# Patient Record
Sex: Female | Born: 1955
Health system: Southern US, Community
[De-identification: ages and names within clinical notes are randomized; demographics above are authoritative.]

## PROBLEM LIST (undated history)

## (undated) DIAGNOSIS — J45909 Unspecified asthma, uncomplicated: Secondary | ICD-10-CM

## (undated) DIAGNOSIS — F1020 Alcohol dependence, uncomplicated: Secondary | ICD-10-CM

## (undated) DIAGNOSIS — I10 Essential (primary) hypertension: Secondary | ICD-10-CM

## (undated) DIAGNOSIS — T7840XA Allergy, unspecified, initial encounter: Secondary | ICD-10-CM

## (undated) DIAGNOSIS — K219 Gastro-esophageal reflux disease without esophagitis: Secondary | ICD-10-CM

## (undated) DIAGNOSIS — E039 Hypothyroidism, unspecified: Secondary | ICD-10-CM

## (undated) HISTORY — DX: Essential (primary) hypertension: I10

## (undated) HISTORY — DX: Unspecified asthma, uncomplicated: J45.909

## (undated) HISTORY — DX: Gastro-esophageal reflux disease without esophagitis: K21.9

## (undated) HISTORY — DX: Hypothyroidism, unspecified: E03.9

## (undated) HISTORY — DX: Alcohol dependence, uncomplicated: F10.20

## (undated) HISTORY — DX: Allergy, unspecified, initial encounter: T78.40XA

## (undated) HISTORY — PX: TONSILLECTOMY AND ADENOIDECTOMY: SUR1326

## (undated) HISTORY — PX: TUBAL LIGATION: SHX77

## (undated) HISTORY — PX: APPENDECTOMY: SHX54

---

## 1998-03-02 ENCOUNTER — Encounter: Admission: RE | Admit: 1998-03-02 | Discharge: 1998-03-02 | Payer: Self-pay | Admitting: Family Medicine

## 1999-02-26 ENCOUNTER — Encounter: Admission: RE | Admit: 1999-02-26 | Discharge: 1999-02-26 | Payer: Self-pay | Admitting: Family Medicine

## 2000-10-13 ENCOUNTER — Encounter: Admission: RE | Admit: 2000-10-13 | Discharge: 2000-10-13 | Payer: Self-pay | Admitting: Family Medicine

## 2002-10-29 ENCOUNTER — Ambulatory Visit (HOSPITAL_COMMUNITY): Admission: RE | Admit: 2002-10-29 | Discharge: 2002-10-29 | Payer: Self-pay | Admitting: Family Medicine

## 2002-10-29 ENCOUNTER — Encounter: Admission: RE | Admit: 2002-10-29 | Discharge: 2002-10-29 | Payer: Self-pay | Admitting: Family Medicine

## 2002-11-03 ENCOUNTER — Encounter: Payer: Self-pay | Admitting: Sports Medicine

## 2002-11-03 ENCOUNTER — Encounter: Admission: RE | Admit: 2002-11-03 | Discharge: 2002-11-03 | Payer: Self-pay | Admitting: Sports Medicine

## 2002-11-03 ENCOUNTER — Encounter: Admission: RE | Admit: 2002-11-03 | Discharge: 2002-11-03 | Payer: Self-pay | Admitting: Family Medicine

## 2002-11-04 ENCOUNTER — Encounter: Admission: RE | Admit: 2002-11-04 | Discharge: 2002-11-04 | Payer: Self-pay | Admitting: Family Medicine

## 2002-11-16 ENCOUNTER — Ambulatory Visit (HOSPITAL_COMMUNITY): Admission: RE | Admit: 2002-11-16 | Discharge: 2002-11-16 | Payer: Self-pay | Admitting: Family Medicine

## 2003-10-19 ENCOUNTER — Encounter: Admission: RE | Admit: 2003-10-19 | Discharge: 2003-10-19 | Payer: Self-pay | Admitting: Family Medicine

## 2003-10-19 ENCOUNTER — Encounter (INDEPENDENT_AMBULATORY_CARE_PROVIDER_SITE_OTHER): Payer: Self-pay | Admitting: Family Medicine

## 2003-11-18 ENCOUNTER — Encounter: Admission: RE | Admit: 2003-11-18 | Discharge: 2003-11-18 | Payer: Self-pay | Admitting: Family Medicine

## 2003-12-26 ENCOUNTER — Encounter: Admission: RE | Admit: 2003-12-26 | Discharge: 2003-12-26 | Payer: Self-pay | Admitting: Family Medicine

## 2003-12-28 ENCOUNTER — Ambulatory Visit (HOSPITAL_COMMUNITY): Admission: RE | Admit: 2003-12-28 | Discharge: 2003-12-28 | Payer: Self-pay | Admitting: Family Medicine

## 2004-02-08 ENCOUNTER — Ambulatory Visit: Payer: Self-pay | Admitting: Family Medicine

## 2004-04-04 ENCOUNTER — Ambulatory Visit: Payer: Self-pay | Admitting: Family Medicine

## 2004-04-17 ENCOUNTER — Ambulatory Visit (HOSPITAL_COMMUNITY): Admission: RE | Admit: 2004-04-17 | Discharge: 2004-04-17 | Payer: Self-pay | Admitting: Sports Medicine

## 2004-04-17 ENCOUNTER — Ambulatory Visit: Payer: Self-pay | Admitting: Sports Medicine

## 2004-07-25 ENCOUNTER — Ambulatory Visit: Payer: Self-pay | Admitting: Family Medicine

## 2004-10-12 ENCOUNTER — Ambulatory Visit: Payer: Self-pay | Admitting: Family Medicine

## 2005-05-26 ENCOUNTER — Encounter (INDEPENDENT_AMBULATORY_CARE_PROVIDER_SITE_OTHER): Payer: Self-pay | Admitting: *Deleted

## 2005-05-26 LAB — CONVERTED CEMR LAB

## 2005-06-03 ENCOUNTER — Ambulatory Visit: Payer: Self-pay | Admitting: Family Medicine

## 2005-06-03 ENCOUNTER — Encounter (INDEPENDENT_AMBULATORY_CARE_PROVIDER_SITE_OTHER): Payer: Self-pay | Admitting: Specialist

## 2005-06-28 ENCOUNTER — Ambulatory Visit (HOSPITAL_COMMUNITY): Admission: RE | Admit: 2005-06-28 | Discharge: 2005-06-28 | Payer: Self-pay | Admitting: Sports Medicine

## 2005-08-13 ENCOUNTER — Ambulatory Visit: Payer: Self-pay | Admitting: Family Medicine

## 2006-06-27 ENCOUNTER — Ambulatory Visit: Payer: Self-pay | Admitting: Family Medicine

## 2006-06-27 ENCOUNTER — Encounter: Payer: Self-pay | Admitting: Family Medicine

## 2006-06-27 LAB — CONVERTED CEMR LAB: TSH: 6.151 microintl units/mL — ABNORMAL HIGH (ref 0.350–5.50)

## 2006-07-17 DIAGNOSIS — Z6839 Body mass index (BMI) 39.0-39.9, adult: Secondary | ICD-10-CM | POA: Insufficient documentation

## 2006-07-17 DIAGNOSIS — Z6841 Body Mass Index (BMI) 40.0 and over, adult: Secondary | ICD-10-CM | POA: Insufficient documentation

## 2006-07-17 DIAGNOSIS — E669 Obesity, unspecified: Secondary | ICD-10-CM | POA: Insufficient documentation

## 2006-07-17 DIAGNOSIS — N951 Menopausal and female climacteric states: Secondary | ICD-10-CM | POA: Insufficient documentation

## 2006-07-17 DIAGNOSIS — K219 Gastro-esophageal reflux disease without esophagitis: Secondary | ICD-10-CM | POA: Insufficient documentation

## 2006-07-17 DIAGNOSIS — F1010 Alcohol abuse, uncomplicated: Secondary | ICD-10-CM | POA: Insufficient documentation

## 2006-07-17 DIAGNOSIS — I10 Essential (primary) hypertension: Secondary | ICD-10-CM | POA: Insufficient documentation

## 2006-07-17 DIAGNOSIS — E039 Hypothyroidism, unspecified: Secondary | ICD-10-CM | POA: Insufficient documentation

## 2006-07-18 ENCOUNTER — Encounter (INDEPENDENT_AMBULATORY_CARE_PROVIDER_SITE_OTHER): Payer: Self-pay | Admitting: *Deleted

## 2006-07-28 ENCOUNTER — Telehealth: Payer: Self-pay | Admitting: *Deleted

## 2006-07-30 ENCOUNTER — Telehealth: Payer: Self-pay | Admitting: *Deleted

## 2006-08-27 ENCOUNTER — Ambulatory Visit: Payer: Self-pay | Admitting: Family Medicine

## 2006-08-27 ENCOUNTER — Encounter: Payer: Self-pay | Admitting: Family Medicine

## 2006-08-27 LAB — CONVERTED CEMR LAB: TSH: 2.854 microintl units/mL (ref 0.350–5.50)

## 2006-08-28 ENCOUNTER — Encounter: Payer: Self-pay | Admitting: Family Medicine

## 2006-09-02 ENCOUNTER — Ambulatory Visit: Payer: Self-pay | Admitting: Family Medicine

## 2006-09-02 DIAGNOSIS — E739 Lactose intolerance, unspecified: Secondary | ICD-10-CM | POA: Insufficient documentation

## 2006-09-05 ENCOUNTER — Encounter (INDEPENDENT_AMBULATORY_CARE_PROVIDER_SITE_OTHER): Payer: Self-pay | Admitting: Family Medicine

## 2007-03-18 ENCOUNTER — Ambulatory Visit: Payer: Self-pay | Admitting: Family Medicine

## 2007-03-26 ENCOUNTER — Ambulatory Visit: Payer: Self-pay | Admitting: Family Medicine

## 2007-03-26 ENCOUNTER — Encounter (INDEPENDENT_AMBULATORY_CARE_PROVIDER_SITE_OTHER): Payer: Self-pay | Admitting: Family Medicine

## 2007-03-26 LAB — CONVERTED CEMR LAB
ALT: 39 units/L — ABNORMAL HIGH (ref 0–35)
AST: 29 units/L (ref 0–37)
Albumin: 4.2 g/dL (ref 3.5–5.2)
Alkaline Phosphatase: 81 units/L (ref 39–117)
BUN: 15 mg/dL (ref 6–23)
CO2: 23 meq/L (ref 19–32)
Calcium: 9.6 mg/dL (ref 8.4–10.5)
Chloride: 104 meq/L (ref 96–112)
Cholesterol: 200 mg/dL (ref 0–200)
Creatinine, Ser: 0.83 mg/dL (ref 0.40–1.20)
Glucose, Bld: 100 mg/dL — ABNORMAL HIGH (ref 70–99)
HCT: 48.2 % — ABNORMAL HIGH (ref 36.0–46.0)
HDL: 54 mg/dL (ref >39)
Hemoglobin: 16.2 g/dL — ABNORMAL HIGH (ref 12.0–15.0)
LDL Cholesterol: 127 mg/dL — ABNORMAL HIGH (ref 0–99)
MCHC: 33.6 g/dL (ref 30.0–36.0)
MCV: 96.2 fL (ref 78.0–100.0)
Platelets: 267 10*3/uL (ref 150–400)
Potassium: 3.9 meq/L (ref 3.5–5.3)
RBC: 5.01 M/uL (ref 3.87–5.11)
RDW: 12.6 % (ref 11.5–14.0)
Sodium: 140 meq/L (ref 135–145)
TSH: 2.792 microintl units/mL (ref 0.350–5.50)
Total Bilirubin: 0.8 mg/dL (ref 0.3–1.2)
Total CHOL/HDL Ratio: 3.7
Total Protein: 6.9 g/dL (ref 6.0–8.3)
Triglycerides: 95 mg/dL (ref <150)
VLDL: 19 mg/dL (ref 0–40)
WBC: 8.8 10*3/uL (ref 4.0–10.5)

## 2007-03-29 ENCOUNTER — Encounter (INDEPENDENT_AMBULATORY_CARE_PROVIDER_SITE_OTHER): Payer: Self-pay | Admitting: Family Medicine

## 2007-06-01 ENCOUNTER — Telehealth: Payer: Self-pay | Admitting: *Deleted

## 2007-08-04 ENCOUNTER — Telehealth: Payer: Self-pay | Admitting: *Deleted

## 2007-08-17 ENCOUNTER — Ambulatory Visit: Payer: Self-pay | Admitting: Sports Medicine

## 2007-08-17 LAB — CONVERTED CEMR LAB: Rapid Strep: NEGATIVE

## 2007-08-18 ENCOUNTER — Telehealth (INDEPENDENT_AMBULATORY_CARE_PROVIDER_SITE_OTHER): Payer: Self-pay | Admitting: *Deleted

## 2007-08-21 ENCOUNTER — Telehealth (INDEPENDENT_AMBULATORY_CARE_PROVIDER_SITE_OTHER): Payer: Self-pay | Admitting: *Deleted

## 2007-09-07 ENCOUNTER — Ambulatory Visit: Payer: Self-pay | Admitting: Sports Medicine

## 2007-09-07 ENCOUNTER — Encounter (INDEPENDENT_AMBULATORY_CARE_PROVIDER_SITE_OTHER): Payer: Self-pay | Admitting: Family Medicine

## 2007-09-09 LAB — CONVERTED CEMR LAB

## 2007-09-10 ENCOUNTER — Encounter (INDEPENDENT_AMBULATORY_CARE_PROVIDER_SITE_OTHER): Payer: Self-pay | Admitting: Family Medicine

## 2007-10-30 ENCOUNTER — Ambulatory Visit (HOSPITAL_COMMUNITY): Admission: RE | Admit: 2007-10-30 | Discharge: 2007-10-30 | Payer: Self-pay | Admitting: Family Medicine

## 2008-01-11 ENCOUNTER — Ambulatory Visit: Payer: Self-pay | Admitting: Sports Medicine

## 2008-01-15 ENCOUNTER — Encounter: Admission: RE | Admit: 2008-01-15 | Discharge: 2008-01-15 | Payer: Self-pay | Admitting: Family Medicine

## 2008-01-18 ENCOUNTER — Telehealth: Payer: Self-pay | Admitting: Family Medicine

## 2008-01-28 ENCOUNTER — Encounter: Payer: Self-pay | Admitting: Family Medicine

## 2008-01-28 ENCOUNTER — Ambulatory Visit: Payer: Self-pay | Admitting: Family Medicine

## 2008-01-28 DIAGNOSIS — J309 Allergic rhinitis, unspecified: Secondary | ICD-10-CM | POA: Insufficient documentation

## 2008-02-01 LAB — CONVERTED CEMR LAB
BUN: 14 mg/dL (ref 6–23)
CO2: 24 meq/L (ref 19–32)
Calcium: 10.2 mg/dL (ref 8.4–10.5)
Chloride: 99 meq/L (ref 96–112)
Creatinine, Ser: 0.74 mg/dL (ref 0.40–1.20)
Glucose, Bld: 117 mg/dL — ABNORMAL HIGH (ref 70–99)
Potassium: 4.2 meq/L (ref 3.5–5.3)
Sodium: 136 meq/L (ref 135–145)
TSH: 2.834 microintl units/mL (ref 0.350–4.50)

## 2008-02-02 ENCOUNTER — Encounter: Admission: RE | Admit: 2008-02-02 | Discharge: 2008-02-02 | Payer: Self-pay | Admitting: Family Medicine

## 2008-02-16 ENCOUNTER — Encounter: Payer: Self-pay | Admitting: Family Medicine

## 2008-02-18 ENCOUNTER — Encounter: Payer: Self-pay | Admitting: Internal Medicine

## 2008-02-23 ENCOUNTER — Telehealth: Payer: Self-pay | Admitting: *Deleted

## 2008-03-02 ENCOUNTER — Ambulatory Visit (HOSPITAL_COMMUNITY): Admission: RE | Admit: 2008-03-02 | Discharge: 2008-03-02 | Payer: Self-pay | Admitting: Cardiology

## 2008-04-28 ENCOUNTER — Encounter: Payer: Self-pay | Admitting: Family Medicine

## 2008-05-20 HISTORY — PX: COLONOSCOPY: SHX174

## 2008-07-14 ENCOUNTER — Encounter: Payer: Self-pay | Admitting: Family Medicine

## 2008-07-14 ENCOUNTER — Ambulatory Visit: Payer: Self-pay | Admitting: Family Medicine

## 2008-07-14 ENCOUNTER — Telehealth (INDEPENDENT_AMBULATORY_CARE_PROVIDER_SITE_OTHER): Payer: Self-pay | Admitting: *Deleted

## 2008-07-14 LAB — CONVERTED CEMR LAB
Cholesterol: 203 mg/dL — ABNORMAL HIGH (ref 0–200)
HDL: 59 mg/dL (ref >39)
LDL Cholesterol: 124 mg/dL — ABNORMAL HIGH (ref 0–99)
TSH: 1.911 microintl units/mL (ref 0.350–4.50)
Total CHOL/HDL Ratio: 3.4
Triglycerides: 102 mg/dL (ref <150)
VLDL: 20 mg/dL (ref 0–40)

## 2008-07-18 ENCOUNTER — Encounter: Payer: Self-pay | Admitting: Family Medicine

## 2008-08-08 ENCOUNTER — Telehealth: Payer: Self-pay | Admitting: Family Medicine

## 2008-08-29 ENCOUNTER — Ambulatory Visit (HOSPITAL_COMMUNITY): Admission: RE | Admit: 2008-08-29 | Discharge: 2008-08-29 | Payer: Self-pay | Admitting: Otolaryngology

## 2008-10-18 ENCOUNTER — Encounter: Payer: Self-pay | Admitting: Family Medicine

## 2008-10-18 ENCOUNTER — Ambulatory Visit: Payer: Self-pay | Admitting: Family Medicine

## 2008-10-19 LAB — CONVERTED CEMR LAB
ALT: 28 units/L (ref 0–35)
AST: 22 units/L (ref 0–37)
Albumin: 4.6 g/dL (ref 3.5–5.2)
Alkaline Phosphatase: 85 units/L (ref 39–117)
BUN: 14 mg/dL (ref 6–23)
CO2: 25 meq/L (ref 19–32)
Calcium: 10.2 mg/dL (ref 8.4–10.5)
Chloride: 98 meq/L (ref 96–112)
Creatinine, Ser: 0.91 mg/dL (ref 0.40–1.20)
Glucose, Bld: 95 mg/dL (ref 70–99)
Lipase: 23 units/L (ref 0–75)
Potassium: 4.2 meq/L (ref 3.5–5.3)
Sodium: 137 meq/L (ref 135–145)
Total Bilirubin: 0.7 mg/dL (ref 0.3–1.2)
Total Protein: 7.3 g/dL (ref 6.0–8.3)

## 2008-10-24 ENCOUNTER — Encounter: Payer: Self-pay | Admitting: Family Medicine

## 2008-11-28 ENCOUNTER — Encounter: Payer: Self-pay | Admitting: Family Medicine

## 2009-02-06 ENCOUNTER — Ambulatory Visit: Payer: Self-pay | Admitting: Family Medicine

## 2009-02-23 ENCOUNTER — Telehealth: Payer: Self-pay | Admitting: Family Medicine

## 2009-03-08 ENCOUNTER — Encounter: Payer: Self-pay | Admitting: Family Medicine

## 2009-03-31 ENCOUNTER — Encounter: Payer: Self-pay | Admitting: Family Medicine

## 2009-04-04 ENCOUNTER — Encounter: Payer: Self-pay | Admitting: Family Medicine

## 2009-05-02 ENCOUNTER — Ambulatory Visit: Payer: Self-pay | Admitting: Family Medicine

## 2009-06-28 ENCOUNTER — Telehealth: Payer: Self-pay | Admitting: Family Medicine

## 2009-07-26 ENCOUNTER — Encounter: Payer: Self-pay | Admitting: Family Medicine

## 2009-08-02 ENCOUNTER — Encounter: Payer: Self-pay | Admitting: Family Medicine

## 2009-08-28 ENCOUNTER — Ambulatory Visit: Payer: Self-pay | Admitting: Family Medicine

## 2009-08-28 ENCOUNTER — Telehealth: Payer: Self-pay | Admitting: Family Medicine

## 2009-08-28 DIAGNOSIS — J45909 Unspecified asthma, uncomplicated: Secondary | ICD-10-CM | POA: Insufficient documentation

## 2009-08-29 ENCOUNTER — Encounter: Payer: Self-pay | Admitting: Family Medicine

## 2009-09-04 ENCOUNTER — Telehealth: Payer: Self-pay | Admitting: Family Medicine

## 2009-09-05 ENCOUNTER — Encounter: Payer: Self-pay | Admitting: Family Medicine

## 2009-10-19 ENCOUNTER — Telehealth: Payer: Self-pay | Admitting: Family Medicine

## 2009-11-06 ENCOUNTER — Telehealth: Payer: Self-pay | Admitting: Family Medicine

## 2009-11-28 ENCOUNTER — Ambulatory Visit: Payer: Self-pay | Admitting: Family Medicine

## 2009-11-28 ENCOUNTER — Encounter: Payer: Self-pay | Admitting: Family Medicine

## 2009-11-28 LAB — CONVERTED CEMR LAB
ALT: 39 units/L — ABNORMAL HIGH (ref 0–35)
AST: 31 units/L (ref 0–37)
Albumin: 4.3 g/dL (ref 3.5–5.2)
Alkaline Phosphatase: 76 units/L (ref 39–117)
BUN: 15 mg/dL (ref 6–23)
CO2: 25 meq/L (ref 19–32)
Calcium: 9.9 mg/dL (ref 8.4–10.5)
Chloride: 101 meq/L (ref 96–112)
Creatinine, Ser: 0.75 mg/dL (ref 0.40–1.20)
Glucose, Bld: 106 mg/dL — ABNORMAL HIGH (ref 70–99)
HCT: 46.4 % — ABNORMAL HIGH (ref 36.0–46.0)
Hemoglobin: 15.4 g/dL — ABNORMAL HIGH (ref 12.0–15.0)
MCHC: 33.2 g/dL (ref 30.0–36.0)
MCV: 95.3 fL (ref 78.0–100.0)
Platelets: 272 10*3/uL (ref 150–400)
Potassium: 4 meq/L (ref 3.5–5.3)
RBC: 4.87 M/uL (ref 3.87–5.11)
RDW: 13 % (ref 11.5–15.5)
Sodium: 139 meq/L (ref 135–145)
TSH: 4.026 microintl units/mL (ref 0.350–4.500)
Total Bilirubin: 0.5 mg/dL (ref 0.3–1.2)
Total Protein: 6.8 g/dL (ref 6.0–8.3)
WBC: 9.1 10*3/uL (ref 4.0–10.5)

## 2010-03-29 ENCOUNTER — Ambulatory Visit: Payer: Self-pay | Admitting: Family Medicine

## 2010-04-08 ENCOUNTER — Encounter: Payer: Self-pay | Admitting: Family Medicine

## 2010-04-26 ENCOUNTER — Ambulatory Visit: Payer: Self-pay | Admitting: Family Medicine

## 2010-04-26 DIAGNOSIS — J209 Acute bronchitis, unspecified: Secondary | ICD-10-CM | POA: Insufficient documentation

## 2010-05-22 ENCOUNTER — Telehealth (INDEPENDENT_AMBULATORY_CARE_PROVIDER_SITE_OTHER): Payer: Self-pay | Admitting: Family Medicine

## 2010-06-11 ENCOUNTER — Ambulatory Visit: Admission: RE | Admit: 2010-06-11 | Discharge: 2010-06-11 | Payer: Self-pay | Source: Home / Self Care

## 2010-06-19 NOTE — Miscellaneous (Signed)
Summary: Orders Update  Clinical Lists Changes  Orders: Added new Test order of [facility]SH-[facility] ([id number]) - Signed 

## 2010-06-19 NOTE — Assessment & Plan Note (Signed)
Summary: cpp/pap/el   Vital Signs:  Patient Profile:   55 Years Old Female Height:     65.5 inches Weight:      203 pounds Temp:     99.0 degrees F Pulse rate:   61 / minute BP sitting:   140 / 98  Pt. in pain?   no  Vitals Entered By: Jone Baseman CMA (September 07, 2007 9:56 AM)                  Chief Complaint:  CPP.  History of Present Illness: S: Patient here for physical 1. physical- patient exercising by walking 1-2 times a week. LMP was 3 years ago.  2. vertigo-resolved 3. GERD- controlled on as needed tums and rolaids 4-HTN-does not check outside office. no CP, SOB, dizziness 5- dx'd with macular degeneration and trying to go on disability    Current Allergies: No known allergies   Past Medical History:    Reviewed history from 07/17/2006 and no changes required:       C5-6 disk disease, elevated LFTs - 790.4, h/o multiple pyelonephritis infections as child, High-risk EtOH use, Previous smoker, Aurea Graff OTC  Past Surgical History:    Reviewed history from 07/17/2006 and no changes required:       Appendectomy - 10/15/2000, Cr 0.6, LFTs OK - 06/06/2005, gc/chlam neg - 06/06/2005, Lipoma removal right axilla as child - 10/15/2000, PFTs-Ratio 94% - 10/12/2004, T & A - 10/15/2000, transverse C/S w/BTL - 10/15/2000, TSH 7.073 at 11/05 - 5.719 1/07 - 06/06/2005   Family History:    Reviewed history from 07/17/2006 and no changes required:       Father lung cancer, CAD (mult MI) first age 52, HTN, high cholesterol.  Mom had emphysema, d/o ALS.  Mom had mastectomy for `calcifications`., Mom -emphysema, Ca deposit in br s/p mastectomy  Social History:    Reviewed history from 07/17/2006 and no changes required:       married with 2 children, 5 grandchildren.  No tobacco.  4-5 beers/night.  Occasional marijuana.  No cocaine.  Works 2 jobs (60 hrs/wk).  Works part-time at [employer] and [employer].  No DUIs     Physical Exam  General:  Well-developed,well-nourished,in no acute distress; alert,appropriate and cooperative throughout examination Head:     normocephalic and atraumatic.   Eyes:     vision grossly intact.   Ears:     R ear normal and L ear normal.   Nose:     External nasal examination shows no deformity or inflammation. Nasal mucosa are pink and moist without lesions or exudates. Mouth:     MMM Neck:     No deformities, masses, or tenderness noted. Breasts:     No mass, nodules, thickening, tenderness, bulging, retraction, inflamation, nipple discharge or skin changes noted.   Lungs:     Normal respiratory effort, chest expands symmetrically. Lungs are clear to auscultation, no crackles or wheezes. Heart:     Normal rate and regular rhythm. S1 and S2 normal without gallop, murmur, click, rub or other extra sounds. Abdomen:     Bowel sounds positive,abdomen soft and non-tender without masses, organomegaly or hernias noted. Genitalia:     Normal introitus for age, no external lesions, no vaginal discharge, mucosa pink and moist, no vaginal or cervical lesions, no vaginal atrophy, no friaility or hemorrhage, normal uterus size and position, no adnexal masses or tenderness Msk:     No deformity or scoliosis noted of thoracic or  lumbar spine.   Extremities:     No clubbing, cyanosis, edema, or deformity noted with normal full range of motion of all joints.      Impression & Recommendations:  Problem # 1:  WELL ADULT EXAM (ICD-V70.0) Assessment: Comment Only Nl physical exam. pap done tody and provided her with mammogram information.  Orders: [facility] - Est  40-64 yrs ([address])   Problem # 2:  HYPERTENSION, BENIGN SYSTEMIC (ICD-401.1) Assessment: Deteriorated BP slightly elevated today. Will try lifestyle modifications the next 3 months (more exercise, decreased salt and alcohol) and asked her to check it outside office . IF still elevated will likely need additional meds. Her updated medication list for  this problem includes:    Hydrochlorothiazide 25 Mg Tabs (Hydrochlorothiazide) .Marland Kitchen... Take 1 tablet by mouth once a day    Toprol Xl 50 Mg Tb24 (Metoprolol succinate) .Marland Kitchen... Take 1 tablet by mouth once a day   Complete Medication List: 1)  Hydrochlorothiazide 25 Mg Tabs (Hydrochlorothiazide) .... Take 1 tablet by mouth once a day 2)  Synthroid 25 Mcg Tabs (Levothyroxine sodium) .... Take 1 tablet by mouth once a day 3)  Toprol Xl 50 Mg Tb24 (Metoprolol succinate) .... Take 1 tablet by mouth once a day 4)  Zantac 75 75 Mg Tabs (Ranitidine hcl) .... Take 1 tablet by mouth twice a day 5)  Adult Aspirin Ec Low Strength 81 Mg Tbec (Aspirin) .... One tab by mouth qday 6)  Flonase 50 Mcg/act Susp (Fluticasone propionate) .... 2 sprays each nostril daily 7)  Antivert 25 Mg Tabs (Meclizine hcl) .Marland Kitchen.. 1 by mouth 2-3x daily as needed  Other Orders: Pap Smear-[facility] ([id number])   Patient Instructions: 1)  i recommend you have your mammogram done 2)  please follow-up in 3 months for blood presure check. Also try to check it a few times outside the office.  watch your salt intake and try to increase exercise by at least one more day a week    Prescriptions: TOPROL XL 50 MG TB24 (METOPROLOL SUCCINATE) Take 1 tablet by mouth once a day  #31 x 5   Entered and Authorized by:   Altamese Cabal MD   Signed by:   Altamese Cabal MD on 09/08/2007   Method used:   Electronically sent to ...       [facility]  [id number]       [address]       [place], [place]  [address]       Ph: [phone] or [phone]       Fax: [phone]   RxID:   [id number] SYNTHROID 25 MCG TABS (LEVOTHYROXINE SODIUM) Take 1 tablet by mouth once a day  #31 x 5   Entered and Authorized by:   Altamese Cabal MD   Signed by:   Altamese Cabal MD on 09/08/2007   Method used:   Electronically sent to ...       [facility]  [id number]       [address]       [place], [place]  [address]       Ph: [phone] or [phone]       Fax: [phone]   RxID:   [id number] HYDROCHLOROTHIAZIDE 25 MG TABS (HYDROCHLOROTHIAZIDE) Take 1 tablet by mouth once a day  #31 x 5   Entered and Authorized by:   Altamese Cabal MD  Signed by:   Altamese Cabal MD on 09/08/2007   Method used:   Electronically sent to ...       [facility]  [id number]       [address]       [place], [place]  [address]       Ph: [phone] or [phone]       Fax: [phone]   RxID:   [id number] FLONASE 50 MCG/ACT  SUSP (FLUTICASONE PROPIONATE) 2 sprays each nostril daily  #1 x 5   Entered and Authorized by:   Altamese Cabal MD   Signed by:   Altamese Cabal MD on 09/07/2007   Method used:   Electronically sent to ...       [facility]  [id number]       [address]       [place], [place]  [address]       Ph: [phone] or [phone]       Fax: [phone]   RxID:   [id number]  ]

## 2010-06-19 NOTE — Assessment & Plan Note (Signed)
Summary: f/u htn, gerd, abd pain/ patient signout   Vital Signs:  Patient profile:   55 year old female Height:      65.5 inches Weight:      204.5 pounds BMI:     33.63 Pulse rate:   70 / minute BP sitting:   128 / 90  (right arm) Cuff size:   large  Vitals Entered By: Arlyss Repress CMA, (October 18, 2008 9:36 AM) CC: f/up HTN. refill meds. epigastric pain after eating x 2 months. Is Patient Diabetic? No Pain Assessment Patient in pain? no        Primary Care Dontai Pember:  Norton Blizzard MD  CC:  f/up HTN. refill meds. epigastric pain after eating x 2 months..  History of Present Illness: 55 yo F here for f/u several issues.  1. Hypertension Meds: lisin/hctz, toprol xl Taking and tolerating? yes Home BPs: none Chest pain: right side after coughing - hurts to take deep breath now but very mild pain Dyspnea:n - only when coughing a lot but this is improved Claudication:n  2. Sore throat/cough - Has seen ENT and had pH probe done.  Was told reflux is well controlled, felt that cough and throat tickle were more d/t allergies.  Was given nasonex, allegra, and singulair samples.  States this is much much better since initiation of these - almost out of singulair and allegra and needs refills.  Is using flonase nasal spray with these and it is helping just as well.  Was told they saw edema on laryngoscopy in past - asked to have them send [place] records.    3. ETOH abuse - still drinking 5-6 beers a day and not interested in quitting.  Discussed relationship to reflux, pancreatitis, cancers, other health concerns.  4. Abdominal pain - endorsed about 2 months of mild epigastric pain when she is hungry and after eating.  Has not had pancreatitis in past nor cholecystectomy.  Taking PPI and H2 blocker.  Lasts at most 10 minutes at a time then resolves.  Not taking NSAIDs except enteric coated aspirin.   5. Rosacea - has had for years.  Was tried on a medicine but insurance would not cover  this.  6. Stress incontinence - mentioned at end of visit occasionally loses urine with her coughs.  Declined flu shots.  Lipids at goal.  Would like to see Dr. Matthias Hughs for colonoscopy discussion (husband has seen him in past).  Mammo reminder was sent to her in mail.  Habits & Providers  Alcohol-Tobacco-Diet     Alcohol drinks/day: >5     Alcohol Counseling: to decrease amount and/or frequency of alcohol intake     Alcohol type: beer     >5/day in last 3 mos: yes  Current Medications (verified): 1)  Lisinopril-Hydrochlorothiazide 20-25 Mg Tabs (Lisinopril-Hydrochlorothiazide) .Marland Kitchen.. 1 Tab By Mouth Daily 2)  Synthroid 25 Mcg Tabs (Levothyroxine Sodium) .... Take 1 Tablet By Mouth Once A Day 3)  Toprol Xl 50 Mg Tb24 (Metoprolol Succinate) .... Take 1 Tablet By Mouth Once A Day.  Please Make Followup Appointment 4)  Adult Aspirin Ec Low Strength 81 Mg  Tbec (Aspirin) .... One Tab By Mouth Qday 5)  Fluticasone Propionate 50 Mcg/act Susp (Fluticasone Propionate) .... 2 Sprays Each Nostril Daily 6)  Nexium 40 Mg Cpdr (Esomeprazole Magnesium) .Marland Kitchen.. 1 Tab By Mouth Daily 7)  Ranitidine Hcl 150 Mg Tabs (Ranitidine Hcl) .Marland Kitchen.. 1 Tab By Mouth Two Times A Day 8)  Allegra 180 Mg  Tabs (Fexofenadine Hcl) .Marland Kitchen.. 1 Tab By Mouth Daily For Allergies 9)  Singulair 10 Mg Tabs (Montelukast Sodium) .Marland Kitchen.. 1 Tab By Mouth Daily For Allergies 10)  Benzaclin With Pump 1-5 % Gel (Clindamycin Phos-Benzoyl Perox) .... Apply To Affected Areas On Face Two Times A Day For Rosacea  Allergies (verified): No Known Drug Allergies  Past History:  Past Medical History: HTN High risk alcohol abuse along with husband GERD Rosacea C5-6 disk disease  Multiple pyelonephritis infections as child Previous smoker Allergic rhinitis  Past Surgical History: Appendectomy Lipoma removal right axilla as child Tonsillectomy/Adenoidectomy Transverse C/S w/BTL  PFTs-Ratio 94% - 10/12/2004  Social History: married with 2 children, 5  grandchildren.  No tobacco.  5-6 beers/night.  Occasional marijuana.  No cocaine.  Works 2 jobs (60 hrs/wk).  Works part-time at [employer] and [employer].  No DUIs  Physical Exam  General:  Gen: Well-developed, well-nourished, NAD, alert and cooperative HEENT: NCAT, Rosacea noted, PERRL, EOMI, no conjunctival inflammation, pharynx nl without erythema or exudates, MMM. External ear exam nl - TMs without bulging or erythema.  No nasal d/c Neck: No LAN, thyromegaly, masses.  No bruits CV: RRR, no MRG Lungs: Nl resp effort. CTAB no wheezes, rales, rhonchi Abd: soft, nt, nd.  No HSM.  BS normoactive.  No masses Ext: dp pulses 2+, no edema Skin: No rashes or lesions on visible skin.  L lateral foot over head of 5th MTP has a plantar wart, appears unchanged.   Impression & Recommendations:  Problem # 1:  HYPERTENSION, BENIGN SYSTEMIC (ICD-401.1) Assessment Unchanged Right at goal.  Will not make any changes today.  Her updated medication list for this problem includes:    Lisinopril-hydrochlorothiazide 20-25 Mg Tabs (Lisinopril-hydrochlorothiazide) .Marland Kitchen... 1 tab by mouth daily    Toprol Xl 50 Mg Tb24 (Metoprolol succinate) .Marland Kitchen... Take 1 tablet by mouth once a day.  please make followup appointment  Orders: [facility]- Est  Level 4 ([address])  Problem # 2:  THROAT PAIN (ICD-784.1) Assessment: Improved Improved with treatment of allergies.  Continue allegra and flonase - can continue singulair and asked her to experiment with this to stop it and see if she feels the same or if she feels she needs this.  Also continue GERD meds and discussed ETOH relationship to gerd and likely will contribute to scratchy throat and chronic cough if she does not change this behavior.  Has had PFTs in past without evidence of obstruction to point to asthma as cause of her cough.  Orders: [facility]- Est  Level 4 (65784)  Problem # 3:  ALCOHOL ABUSE, UNSPECIFIED (ICD-305.00) Assessment: Unchanged does not want to cut down or  quit at this time.  Orders: [facility]- Est  Level 4 ([address]29)  Problem # 4:  EPIGASTRIC PAIN (ICD-789.06) Assessment: New Intermittent pain related to eating more typical of gastritis, ulcer disease.  Is at risk for pancreatitis given her ETOH use.  Will also assess for possibility of cholecystitis. May need u/s to look for gallstones as biliary colic would be more likely than cholecystitis.  Check h. pylori for its possible relationship to ulcers.  Refer to GI for consideration of EGD and screening colonoscopy as pt is > 89 y/o.  Orders: H pylori-[facility] [phone]) Comp Met-[facility] [phone]) Lipase-[facility] [phone]) [facility]- Est  Level 4 ([address]95) Gastroenterology Referral (GI)  Problem # 5:  ROSACEA (ICD-695.3) Assessment: Unchanged Try benzaclin if covered by insurance.  Orders: [facility]- Est  Level 4 ([address])  Problem # 6:  STRESS INCONTINENCE (ICD-788.39)  Assessment: New Discussed Kegel exercises - she will do twice a day over next month.  If no improvement, refer to urology  Problem # 7:  SPECIAL SCREENING FOR MALIGNANT NEOPLASMS COLON (ICD-V76.51)  Orders: Gastroenterology Referral (GI)  Complete Medication List: 1)  Lisinopril-hydrochlorothiazide 20-25 Mg Tabs (Lisinopril-hydrochlorothiazide) .Marland Kitchen.. 1 tab by mouth daily 2)  Synthroid 25 Mcg Tabs (Levothyroxine sodium) .... Take 1 tablet by mouth once a day 3)  Toprol Xl 50 Mg Tb24 (Metoprolol succinate) .... Take 1 tablet by mouth once a day.  please make followup appointment 4)  Adult Aspirin Ec Low Strength 81 Mg Tbec (Aspirin) .... One tab by mouth qday 5)  Fluticasone Propionate 50 Mcg/act Susp (Fluticasone propionate) .... 2 sprays each nostril daily 6)  Nexium 40 Mg Cpdr (Esomeprazole magnesium) .Marland Kitchen.. 1 tab by mouth daily 7)  Ranitidine Hcl 150 Mg Tabs (Ranitidine hcl) .Marland Kitchen.. 1 tab by mouth two times a day 8)  Allegra 180 Mg Tabs (Fexofenadine hcl) .Marland Kitchen.. 1 tab by mouth daily for allergies 9)  Singulair 10 Mg Tabs (Montelukast sodium) .Marland Kitchen.. 1 tab  by mouth daily for allergies 10)  Benzaclin With Pump 1-5 % Gel (Clindamycin phos-benzoyl perox) .... Apply to affected areas on face two times a day for rosacea  Patient Instructions: 1)  Continue taking the nexium and ranitidine as you have been - nexium on empty stomach in the morning. 2)  I will refer you to a GI doctor (Buccini) to discuss your abdominal pain and colonoscopy.  3)  We will contact you with the appointment and the results of today's labwork. 4)  Do the exercises I told you about for your bladder - you have what is called stress incontinence (losing urine with increased abdominal pressure - usually laughing, coughing). 5)  Avoid ibuprofen, aleve, advil products for pain - these can make your stomach hurt worse. 6)  We will try to see if benzaclin is covered by your insurance for your rosacea.  If you feel your skin is too dry, back off on how often you use this. 7)  I have sent refills of your medications to your pharmacy. 8)  You are due for a mammogram soon. 9)  Ask Dr. Jearld Fenton to fax me records at [phone] when you next see him. 10)  Otherwise follow up in 3 months. Prescriptions: BENZACLIN WITH PUMP 1-5 % GEL (CLINDAMYCIN PHOS-BENZOYL PEROX) apply to affected areas on face two times a day for rosacea  #60g x 2   Entered and Authorized by:   Norton Blizzard MD   Signed by:   Norton Blizzard MD on 10/18/2008   Method used:   Electronically to        [id number]* (retail)       [address]       [place], [place]  [address]       Ph: [phone] or [phone]       Fax: [phone]   RxID:   [id number] RANITIDINE HCL 150 MG TABS (RANITIDINE HCL) 1 tab by mouth two times a day  #60 x 3   Entered and Authorized by:   Norton Blizzard MD   Signed by:   Norton Blizzard MD on 10/18/2008   Method used:   Electronically to        [id number]* (retail)       [address]  [place],  [place]  [address]       Ph: [phone] or [phone]       Fax: [phone]   RxID:   [id number] NEXIUM 40 MG CPDR (ESOMEPRAZOLE MAGNESIUM) 1 tab by mouth daily  #30 x 3   Entered and Authorized by:   Norton Blizzard MD   Signed by:   Norton Blizzard MD on 10/18/2008   Method used:   Electronically to        [id number]* (retail)       [address]       [place], [place]  [address]       Ph: [phone] or [phone]       Fax: [phone]   RxID:   [id number] FLUTICASONE PROPIONATE 50 MCG/ACT SUSP (FLUTICASONE PROPIONATE) 2 sprays each nostril daily  #1 x 5   Entered and Authorized by:   Norton Blizzard MD   Signed by:   Norton Blizzard MD on 10/18/2008   Method used:   Electronically to        [id number]* (retail)       [address]       [place], [place]  [address]       Ph: [phone] or [phone]       Fax: [phone]   RxID:   [id number] TOPROL XL 50 MG TB24 (METOPROLOL SUCCINATE) Take 1 tablet by mouth once a day.  Please make followup appointment  #30 x 3   Entered and Authorized by:   Norton Blizzard MD   Signed by:   Norton Blizzard MD on 10/18/2008   Method used:   Electronically to        [id number]* (retail)       [address]       [place], [place]  [address]       Ph: [phone] or [phone]       Fax: [phone]   RxID:   [id number] SYNTHROID 25 MCG TABS (LEVOTHYROXINE SODIUM) Take 1 tablet by mouth once a day  #30 x 3   Entered and Authorized by:   Norton Blizzard MD   Signed by:   Norton Blizzard MD on 10/18/2008   Method used:   Electronically to        [facility]* (retail)       [address]       [place], [place]  [address]       Ph: [phone] or [phone]       Fax: [phone]   RxID:   [id number] LISINOPRIL-HYDROCHLOROTHIAZIDE 20-25 MG TABS  (LISINOPRIL-HYDROCHLOROTHIAZIDE) 1 tab by mouth daily  #30 x 3   Entered and Authorized by:   Norton Blizzard MD   Signed by:   Norton Blizzard MD on 10/18/2008   Method used:   Electronically to        [facility] [address]* (retail)       [address]       [place], [place]  [address]       Ph: [phone] or [phone]       Fax: [phone]  RxID:   [id number] SINGULAIR 10 MG TABS (MONTELUKAST SODIUM) 1 tab by mouth daily for allergies  #30 x 3   Entered and Authorized by:   Norton Blizzard MD   Signed by:   Norton Blizzard MD on 10/18/2008   Method used:   Electronically to        [facility] [address]* (retail)       [address]       [place], [place]  [address]       Ph: [phone] or [phone]       Fax: [phone]   RxID:   [id number] ALLEGRA 180 MG TABS (FEXOFENADINE HCL) 1 tab by mouth daily for allergies  #30 x 3   Entered and Authorized by:   Norton Blizzard MD   Signed by:   Norton Blizzard MD on 10/18/2008   Method used:   Electronically to        [facility]* (retail)       [address]       [place], [place]  [address]       Ph: [phone] or [phone]       Fax: [phone]   RxID:   [id number]   Last HDL:  59 (07/14/2008 9:04:00 PM) HDL Next Due:  1 yr Last LDL:  124 (07/14/2008 9:04:00 PM) LDL Next Due:  1 yr Last PAP:   Specimen Adequacy: Satisfactory for evaluation.   Interpretation/Result:Negative for intraepithelial Lesion or Malignancy.    (09/09/2007 9:41:52 PM) PAP Next Due:  3 yr   Appended Document: f/u htn, gerd, abd pain/ patient signout Please refer to GI for epigastric abdominal pain and to discuss screening colonoscopy.  Husband sees Dr. Matthias Hughs so she prefers to see him if possible.  Thanks!  Appended Document: f/u htn, gerd, abd pain/ patient signout Appt. made for 10/21/08 at 0945 am.  Called pt and left message to call  us back.  Modesta Messing LPN  Appended Document: H. pylori - neg    Lab Visit   Laboratory Results   Blood Tests   Date/Time Received: October 18, 2008 11:24 AM  Date/Time Reported: October 18, 2008 3:59 PM    H. pylori: negative Comments: ...............test performed by......Marland KitchenBonnie A. [place], [place] (ASCP)     Orders Today:

## 2010-06-19 NOTE — Consult Note (Signed)
Summary: [facility]  [facility]   Imported By: Clydell Hakim 12/02/2008 15:26:13  _____________________________________________________________________  External Attachment:    Type:   Image     Comment:   External Document  Appended Document: [facility] Reviewed and entered into prevention form.   Clinical Lists Changes  Observations: Added new observation of COLONNXTDUE: 11/29/2018 (12/02/2008 15:56) Added new observation of LST COLON DT: 11/28/2008 (11/28/2008 15:58) Added new observation of COLONOSCOPY: diverticulosis (11/28/2008 15:58)      Colonoscopy Result Date:  11/28/2008 Colonoscopy Result:  diverticulosis Colonoscopy Next Due:  10 yr

## 2010-06-19 NOTE — Consult Note (Signed)
Summary: [facility]  [facility]   Imported By: Bradly Bienenstock 05/03/2008 15:30:39  _____________________________________________________________________  External Attachment:    Type:   Image     Comment:   External Document

## 2010-06-19 NOTE — Progress Notes (Signed)
Summary: Nexium 40mg  qd prn #30 x 11  Phone Note Refill Request Call back at Home Phone [phone] Message from:  Patient  Refills Requested: Medication #1:  NEXIUM 40 MG CPDR 1 tab by mouth once daily as needed for reflux   Notes: needs prior auth cough has come back and needs this asap   Initial call taken by: De Nurse,  September 04, 2009 2:11 PM    Prescriptions: NEXIUM 40 MG CPDR (ESOMEPRAZOLE MAGNESIUM) 1 tab by mouth once daily as needed for reflux  #30 x 11   Entered and Authorized by:   Marisue Ivan  MD   Signed by:   Marisue Ivan  MD on 09/04/2009   Method used:   Electronically to        [facility]  [address] (retail)       [address]       [place], [place]  [address]       Ph: [phone] or [phone]       Fax: [phone]   RxID:   [id number]

## 2010-06-19 NOTE — Consult Note (Signed)
[facility]   Imported By: De Nurse 03/23/2009 11:49:14  _____________________________________________________________________  External Attachment:    Type:   Image     Comment:   External Document  Appended Document: medication list update Reviewed   Clinical Lists Changes  Medications: Added new medication of QVAR 80 MCG/ACT AERS (BECLOMETHASONE DIPROPIONATE) 2 puffs daily

## 2010-06-19 NOTE — Letter (Signed)
Summary: Pre Visit No Show Letter  [facility]  [address] [place], [place] [address]   Phone: [phone]  Fax: [phone]        February 18, 2008 MRN: [id number]    [facility] [address] [place], [place]  [address]    Dear Ms. Sparlin,   We have been unable to reach you by phone concerning the pre-procedure visit that you missed on__10.01.2009_________________. For this reason,your procedure scheduled on_10.16.2009___________ has been cancelled. Our scheduling staff will gladly assist you with rescheduling your appointments at a more convenient time. Please call our office at [phone] between the hours of 8:00am and 5:00pm, press option #2 to reach an appointment scheduler. Please consider updating your contact numbers at this time so that we can reach you by phone in the future with schedule changes or results.    Thank you,    Olene Craven RN [facility]

## 2010-06-19 NOTE — Progress Notes (Signed)
Summary: refill  Phone Note Refill Request Call back at Home Phone [phone] Message from:  Patient on November 06, 2009 2:19 PM  Refills Requested: Medication #1:  HYDROCHLOROTHIAZIDE 25 MG  TABS Take 1 tab by mouth every morning  Medication #2:  ZYRTEC ALLERGY 10 MG TABS one tablet by mouth daily Initial call taken by: Loralee Pacas CMA,  November 06, 2009 2:19 PM  Follow-up for Phone Call        to PCP Follow-up by: Gladstone Pih,  November 06, 2009 3:04 PM  Additional Follow-up for Phone Call Additional follow up Details #1::        Notified pt she got a 3 months suppy of both meds on June 4th, stated that she did not, advised to check with pharm to see if they were holding the other 2 months worth due to supply or ins issues.  Pt will check wu=ith pharm, will call back if needs assist Additional Follow-up by: Gladstone Pih,  November 06, 2009 4:22 PM

## 2010-06-19 NOTE — Progress Notes (Signed)
Summary: Questions about OTC meds  Phone Note Call from Patient Call back at Home Phone [phone]   Summary of Call: Pt is having sinus drainage and would like to discuss best OTC meds.   Initial call taken by: Rae Roam,  June 01, 2007 2:55 PM  Follow-up for Phone Call        gave her coricidin and mucinex as safe for people with htn. urged to increase fluid. call if no better Follow-up by: Golden Circle RN,  June 01, 2007 2:57 PM

## 2010-06-19 NOTE — Progress Notes (Signed)
Summary: status of rx  Phone Note Call from Patient Call back at Home Phone [phone]   Reason for Call: Refill Medication Summary of Call: pt is checking status of rx for toprol, pharmacy faxed over a week ago Initial call taken by: ERIN LEVAN,  August 04, 2007 3:23 PM  Follow-up for Phone Call        called it in & notified pt Follow-up by: Golden Circle RN,  August 04, 2007 3:37 PM

## 2010-06-19 NOTE — Progress Notes (Signed)
Summary: 3 month supply of med refills  Phone Note Refill Request Call back at Home Phone [phone] Message from:  Patient  Refills Requested: Medication #1:  HYDROCHLOROTHIAZIDE 25 MG  TABS Take 1 tab by mouth every morning  Medication #2:  METOPROLOL SUCCINATE 100 MG XR24H-TAB 1 tablet by mouth daily for high blood pressure  Medication #3:  ZYRTEC ALLERGY 10 MG TABS one tablet by mouth daily Initial call taken by: De Nurse,  October 19, 2009 1:53 PM    Prescriptions: ZYRTEC ALLERGY 10 MG TABS (CETIRIZINE HCL) one tablet by mouth daily  #90 x 0   Entered and Authorized by:   Marisue Ivan  MD   Signed by:   Marisue Ivan  MD on 10/21/2009   Method used:   Electronically to        [facility]  [address] [id number]* (retail)       [address]       [place], [place]  [address]       Ph: [phone] or [phone]       Fax: [phone]   RxID:   [id number] METOPROLOL SUCCINATE 100 MG XR24H-TAB (METOPROLOL SUCCINATE) 1 tablet by mouth daily for high blood pressure  #90 x 0   Entered and Authorized by:   Marisue Ivan  MD   Signed by:   Marisue Ivan  MD on 10/21/2009   Method used:   Electronically to        [facility]  [address] [id number]* (retail)       [address]       [place], [place]  [address]       Ph: [phone] or [phone]       Fax: [phone]   RxID:   [id number] HYDROCHLOROTHIAZIDE 25 MG  TABS (HYDROCHLOROTHIAZIDE) Take 1 tab by mouth every morning  #90 x 0   Entered and Authorized by:   Marisue Ivan  MD   Signed by:   Marisue Ivan  MD on 10/21/2009   Method used:   Electronically to        [facility]  [address] [id number]* (retail)       [address]       [place], [place]  [address]       Ph: [phone] or [phone]       Fax: [phone]   RxID:   [id number]

## 2010-06-19 NOTE — Progress Notes (Signed)
Summary: Triage  Phone Note Call from Patient Call back at Home Phone [phone]   Reason for Call: Talk to Nurse Summary of Call: pt is requesting to speak with RN re: her sinuses, was already seen for this issue and thinks she may have a sinus infection.  Initial call taken by: Knox Royalty,  August 21, 2007 2:05 PM  Follow-up for Phone Call        Spoke with pt she is requesting antibiotic- she states the antivert helped with her dizziness, and the nose spray helps temporarily with her congestion, but her face and head feels very tight with sinus pressure.  States she has been using coricidin decongestant as she has htn.  Pt states she is not running a fever, but has greenish brown nasal drainage.  Paged Dr. Seleta Rhymes - he states he cannot call in an antibiotic as it has been 4 days since pt was seen.  Advised that she try a sinus rinse, keep using nose spray aggressively.  Also advised if she runs a fever or pain gets to bad this weekend, go to urgent care - pt agreeable.   Follow-up by: AMY MARTIN RN,  August 21, 2007 2:24 PM

## 2010-06-19 NOTE — Consult Note (Signed)
Summary: [facility]   Imported By: Haydee Salter 03/17/2008 14:29:20  _____________________________________________________________________  External Attachment:    Type:   Image     Comment:   External Document

## 2010-06-19 NOTE — Letter (Signed)
Summary: Results Follow Up Letter  [facility]  [address]   [place], [place] [address]   Phone: [phone]  Fax: [phone]    09/10/2007 MRN: [id number]  [address] [place], [place]  [address]  Dear Ms. Streeper,   The following are the results of your recent test(s):  Test     Result     Pap Smear    Normal____x___  Not Normal_____       Comments:good news! your next one will be due in 2 years     _________________________________________________________  Please f/u as previously scheduled _________________________________________________________ _________________________________________________________ _________________________________________________________  Sincerely,  Altamese Cabal MD [facility]          Appended Document: Results Follow Up Letter sent

## 2010-06-19 NOTE — Miscellaneous (Signed)
Summary: prior auth  Clinical Lists Changes pa for Qvar to pcp.Golden Circle RN  August 29, 2009 3:58 PM  she gets this drug from her allergist. dent back to pharmacy asking them to contact her allergist for prior auth & refill per pcp.Golden Circle RN  August 30, 2009 9:16 AM

## 2010-06-19 NOTE — Assessment & Plan Note (Signed)
Summary: meds wp   Vital Signs:  Patient Profile:   55 Years Old Female Height:     65.5 inches Weight:      208.9 pounds BMI:     34.36 Temp:     98.1 degrees F oral Pulse rate:   59 / minute BP sitting:   151 / 81  Pt. in pain?   no  Vitals Entered By: Garen Grams LPN (January 11, 2008 8:51 AM)                   PCP:  Norton Blizzard MD  Chief Complaint:  discuss meds and knot on left elbow.  History of Present Illness: Hypertension Meds: hctz 25, toprol xl 50. Taking and tolerating? yes Home BPs: none - repeat BP here was 158/110 Chest pain: no Dyspnea: no  Knot Left Arm - present for greater than a year per patient.  Just distal to left olecranon bursa.  No known trauma.  No similar lesions on body.  Has had one lipoma that was removed from right axilla when she was a kid.    Hypothyroidism - TSH therapeutic last check on Synthroid 25 daily.  GERD - has heartburn and sour brash.  Only takes prilosec as needed.  No postnasal drip.  Has a tickle in throat constantly and feeling that she has to clear throat.  Chest also feels full.  H/o bad sinus problems but not now.  Has some mild epigastric pain from time to time.  Prevention - due for a colonoscopy but wants to get after she comes back from vacation after 9/29.  Was asking for a cardiac cath too but stated this was very invasive - no chest pain, shortness of breath - discussed would consider a stress eval though.    Prior Medication List:  HYDROCHLOROTHIAZIDE 25 MG TABS (HYDROCHLOROTHIAZIDE) Take 1 tablet by mouth once a day SYNTHROID 25 MCG TABS (LEVOTHYROXINE SODIUM) Take 1 tablet by mouth once a day TOPROL XL 50 MG TB24 (METOPROLOL SUCCINATE) Take 1 tablet by mouth once a day ZANTAC 75 75 MG TABS (RANITIDINE HCL) Take 1 tablet by mouth twice a day ADULT ASPIRIN EC LOW STRENGTH 81 MG  TBEC (ASPIRIN) one tab by mouth qday FLONASE 50 MCG/ACT  SUSP (FLUTICASONE PROPIONATE) 2 sprays each nostril daily ANTIVERT 25  MG  TABS (MECLIZINE HCL) 1 by mouth 2-3x daily as needed   Current Allergies (reviewed today): No known allergies     Risk Factors:  Alcohol use:  yes    Counseled to quit/cut down alcohol use:  yes    Physical Exam  General:     Gen: Well-developed, well-nourished, NAD, alert and cooperative HEENT: NCAT, Rosacea noted, PERRL, EOMI, no conjunctival inflammation, pharynx nl without erythema or exudates, MMM. External ear exam nl - TMs without bulging or erythema.  No nasal d/c Neck: No LAN, thyromegaly, masses, bruits CV: RRR, no MRG Lungs: Nl resp effort. CTAB no wheezes, rales, rhonchi Ext: warm, well perfused.  No edema or cyanosis.  Left elbow distal to olecranon bursa skin overlying hypertrophic without defined mass underneath but feels thickened and separate from bone - bedside ultrasound does not show discrete mass. Skin: No rashes or lesions on visible skin    Impression & Recommendations:  Problem # 1:  LOCALIZED SUPERFICIAL SWELLING MASS OR LUMP (ICD-782.2) Assessment: New Ultrasound without evidence of discrete mass or cyst.  No overlying erythema.  ? if due to trauma.  No need for imaging -  reassured patient.  To use ACE bandage for compression.  Problem # 2:  HYPERTENSION, BENIGN SYSTEMIC (ICD-401.1) Assessment: Deteriorated Repeat still very elevated at 158/110.  She denies ever being on ace and not allergic.  Will add lisinopril.  Continue asa 81.  Check labs in 2 weeks.  Will refer to cardiology for a stress test.  Her updated medication list for this problem includes:    Lisinopril-hydrochlorothiazide 10-12.5 Mg Tabs (Lisinopril-hydrochlorothiazide) .Marland Kitchen... 1 tab by mouth daily    Toprol Xl 50 Mg Tb24 (Metoprolol succinate) .Marland Kitchen... Take 1 tablet by mouth once a day  Future Orders: Basic Met-[facility] ([id number]) ... 12/18/2008 TSH-[facility] ([id number]) ... 12/18/2008   Problem # 3:  GASTROESOPHAGEAL REFLUX, NO ESOPHAGITIS (ICD-530.81) Assessment:  Deteriorated Advised she use PPI regularly.  Can add H2 blocker and/or tums as needed.  Lifestyle modifications discussed.  Consider testing for h. pylori if not improving.  Her updated medication list for this problem includes:    Zantac 75 75 Mg Tabs (Ranitidine hcl) .Marland Kitchen... Take 1 tablet by mouth twice a day    Prilosec Otc 20 Mg Tbec (Omeprazole magnesium) .Marland Kitchen... 1 tab by mouth daily   Problem # 4:  HYPOTHYROIDISM, UNSPECIFIED (ICD-244.9) Assessment: Unchanged Check TSH in 2 weeks when she comes back for other labs.  Her updated medication list for this problem includes:    Synthroid 25 Mcg Tabs (Levothyroxine sodium) .Marland Kitchen... Take 1 tablet by mouth once a day   Problem # 5:  SPECIAL SCREENING FOR MALIGNANT NEOPLASMS COLON (ICD-V76.51) Assessment: Comment Only Would like to have screening colonoscopy after she comes back from vacation on September 29. Orders: Gastroenterology Referral (GI)   Complete Medication List: 1)  Lisinopril-hydrochlorothiazide 10-12.5 Mg Tabs (Lisinopril-hydrochlorothiazide) .Marland Kitchen.. 1 tab by mouth daily 2)  Synthroid 25 Mcg Tabs (Levothyroxine sodium) .... Take 1 tablet by mouth once a day 3)  Toprol Xl 50 Mg Tb24 (Metoprolol succinate) .... Take 1 tablet by mouth once a day 4)  Zantac 75 75 Mg Tabs (Ranitidine hcl) .... Take 1 tablet by mouth twice a day 5)  Adult Aspirin Ec Low Strength 81 Mg Tbec (Aspirin) .... One tab by mouth qday 6)  Flonase 50 Mcg/act Susp (Fluticasone propionate) .... 2 sprays each nostril daily 7)  Antivert 25 Mg Tabs (Meclizine hcl) .Marland Kitchen.. 1 by mouth 2-3x daily as needed 8)  Prilosec Otc 20 Mg Tbec (Omeprazole magnesium) .Marland Kitchen.. 1 tab by mouth daily  Other Orders: CXR- 2view (CXR) Cardiology Other (Cardiology Other)   Patient Instructions: 1)  We will start a combination pill of HCTZ/lisinopril in place of your HCTZ (hydrochlorothiazide).  [facility] [address] 2)  Start taking prilosec daily.  You can  continue taking zantac as well if daily prilosec still doesn't help with your tickle in the throat.  Tums can be used as needed for really bad reflux. 3)  Things that make heartburn worse: fatty foods, chocolate, spicy foods, tomato-based foods, caffeine, smoking, mints, alcohol - reduce or eliminate use of these 4)  Eat small meals 5)  Do not wear tight clothing 6)  Do not eat within 2 hours of going to sleep 7)  Propping the head of your bed up may be helpful as well 8)  Get the Chest x-ray at your convenience. 9)  I will put in referrals for a stress test and for your pre-op colonoscopy (after you get back from vacation) 10)  Use a compression bandage (ACE wrap) for your left  elbow and keep it protected - rubbing it against things can make it worse. 11)  Follow up in 2 weeks just for a lab check - call the day before you come in to tell us you're coming in for labs.  Follow up with me in 2 months.   Prescriptions: LISINOPRIL-HYDROCHLOROTHIAZIDE 10-12.5 MG TABS (LISINOPRIL-HYDROCHLOROTHIAZIDE) 1 tab by mouth daily  #30 x 5   Entered and Authorized by:   Norton Blizzard MD   Signed by:   Norton Blizzard MD on 01/11/2008   Method used:   Electronically to        [facility]  [address] [id number]* (retail)       [address]       [place], [place]  [address]       Ph: [phone] or [phone]       Fax: [phone]   RxID:   [id number]  ]  Appended Document: meds wp Please make referrals for patient to GI (screening colonoscopy - see order) and set up a stress cardiolyte (cardiology test - can set up in a cardiology office without specific cardiology referral) - please make both for after September 29th.  Thanks!  Norton Blizzard MD  January 11, 2008 6:11 PM   See orders............................................... Lisa [name] CMA  January 12, 2008 10:02 AM  Clinical Lists Changes  Orders: Added new Test order of [facility]- Est  Level 4 ([address]) - Signed

## 2010-06-19 NOTE — Consult Note (Signed)
Summary: [facility]  [facility]   Imported By: Clydell Hakim 12/02/2008 15:27:09  _____________________________________________________________________  External Attachment:    Type:   Image     Comment:   External Document  Appended Document: [facility] Reviewed and entered into clinic reports   Clinical Lists Changes  Observations: Added new observation of UGI: normal:   (11/28/2008 15:59)       UGI  Procedure date:  11/28/2008  Findings:      normal:

## 2010-06-19 NOTE — Assessment & Plan Note (Signed)
Summary: medication refills wp   Vital Signs:  Patient Profile:   55 Years Old Female Height:     65.5 inches Weight:      207.3 pounds BMI:     34.09 Temp:     97.8 degrees F oral Pulse rate:   71 / minute BP sitting:   134 / 92  (right arm)  Pt. in pain?   no  Vitals Entered By: Modesta Messing LPN (July 14, 2008 8:27 AM)                 Last HDL:  54 (03/26/2007 7:42:00 PM) HDL Next Due:  1 yr Last LDL:  127 (03/26/2007 7:42:00 PM) LDL Next Due:  1 yr Flex Sig Next Due:  Not Indicated Hemoccult Next Due:  Not Indicated    PCP:  Norton Blizzard MD  Chief Complaint:  Medication refills.  Lesion left foot.Marland Kitchen  History of Present Illness: 55 yo F here for f/u several issues.  1. Hypertension Meds: lisin/hctz, toprol xl Taking and tolerating? yes Home BPs: none Chest pain:n Dyspnea:n - only when coughing a lot feels like she can't catch her breath. Claudication:n  2. GERD/throat pain - seeing ENT for this issue.  Have done laryngoscopies per patient report and looks very inflamed around vocal cords.  She is on nexium, advair, and neurontin for this.  I presume advair is to treat asthma which can lead to chronic cough though this has not helped her.  Nexium for her chronic GERD which have felt is the primary reason for her irritated through and chronic cough.  She has not been on H2 blocker.  Did not see GI for second appointment prior to colonoscopy so colonoscopy was canceled.  3. Lesion on L foot - there unknown period of time but more pain the past few weeks.  Is on outside of MTP head.  Has been filing this down.  No other lesions.  Not using any meds on this.  4. ? breast mass - has had a small tender knot at 12 o'clock position of left breast that comes and goes.  Cannot feel it now.  No other breast masses.  Has had mammogram that was normal in past year.  No nipple discharge, retraction, other complaints.  Declined flu shots.  Due for lipids.  Wanted to wait  until throat issue taken care of before colonoscopy but it has been 4 months - urged her to f/u with them regarding this.    Updated Prior Medication List: LISINOPRIL-HYDROCHLOROTHIAZIDE 20/25 (LISINOPRIL-HYDROCHLOROTHIAZIDE) 1 tab by mouth daily SYNTHROID 25 MCG TABS (LEVOTHYROXINE SODIUM) Take 1 tablet by mouth once a day TOPROL XL 50 MG TB24 (METOPROLOL SUCCINATE) Take 1 tablet by mouth once a day.  Please make followup appointment ZANTAC 75 75 MG TABS (RANITIDINE HCL) Take 1 tablet by mouth twice a day ADULT ASPIRIN EC LOW STRENGTH 81 MG  TBEC (ASPIRIN) one tab by mouth qday FLUTICASONE PROPIONATE 50 MCG/ACT SUSP (FLUTICASONE PROPIONATE) 2 sprays each nostril daily NEXIUM 40 MG CPDR (ESOMEPRAZOLE MAGNESIUM) 1 tab by mouth daily Current Allergies (reviewed today): No known allergies       Physical Exam  General:     Gen: Well-developed, well-nourished, NAD, alert and cooperative HEENT: NCAT, Rosacea noted, PERRL, EOMI, no conjunctival inflammation, pharynx nl without erythema or exudates, MMM. External ear exam nl - TMs without bulging or erythema.  No nasal d/c Neck: No LAN, thyromegaly, masses.  No bruits CV: RRR, no MRG Lungs: Nl  resp effort. CTAB no wheezes, rales, rhonchi Skin: No rashes or lesions on visible skin.  L lateral foot over head of 5th MTP has a plantar wart that appears to be filed down. Breasts:     No mass, nodules, thickening, tenderness, bulging, retraction, inflammation, or skin changes noted.      Impression & Recommendations:  Problem # 1:  HYPERTENSION, BENIGN SYSTEMIC (ICD-401.1) Assessment: Improved Repeat 130/86 at goal.  No changes.  After sending med to pharmacy, she called stating lisin/hctz was increased to 20/25 - made change in our records and sent increased dose to pharmacy.  Her updated medication list for this problem includes:    Lisinopril-hydrochlorothiazide 10-12.5 Mg Tabs (Lisinopril-hydrochlorothiazide) .Marland Kitchen... 1 tab by mouth  daily    Toprol Xl 50 Mg Tb24 (Metoprolol succinate) .Marland Kitchen... Take 1 tablet by mouth once a day.  please make followup appointment  Orders: Lipid-[facility] ([address]-60454) [facility]- Est  Level 4 (09811)   Problem # 2:  PLANTAR WART, LEFT (BJY-782.95) Assessment: New Declined freezing today - agreed with this as on foot surface this can be very painful and may limp as a result.  Will try OTC salicylic acid compounds first - other options if not improving after a month include aldara and freezing.  Orders: [facility]- Est  Level 4 (99214)   Problem # 3:  GASTROESOPHAGEAL REFLUX, NO ESOPHAGITIS (ICD-530.81) Assessment: Deteriorated Likely the cause of her throat pain, persistent cough.  One additional test ENT wants to do per patient - she is going there today to find out when she is having this done.  Add H2 blocker to PPI and tums as needed.  Discussed lifestyle mods again.  She is still eating fatty foods and spicy foods which I told her will make this worse.  Her updated medication list for this problem includes:    Zantac 75 75 Mg Tabs (Ranitidine hcl) .Marland Kitchen... Take 1 tablet by mouth twice a day    Nexium 40 Mg Cpdr (Esomeprazole magnesium) .Marland Kitchen... 1 tab by mouth daily    Ranitidine Hcl 150 Mg Tabs (Ranitidine hcl) .Marland Kitchen... 1 tab by mouth two times a day  Orders: [facility]- Est  Level 4 (62130)   Problem # 4:  THROAT PAIN (ICD-784.1) Assessment: Deteriorated Continue with f/u with ENT.  If no improvement with GERD tx, will refer to GI for additional workup.  Orders: [facility]- Est  Level 4 (86578)   Problem # 5:  BREAST MASS, LEFT (ICD-611.72) Assessment: New No mass felt and last mammogram was negative.  Will follow.  If is consistently enlarged, nipple discharge, persistent pain, advised her to call us for workin appointment.  Orders: [facility]- Est  Level 4 ([address]214)   Problem # 6:  HYPOTHYROIDISM, UNSPECIFIED (ICD-244.9) Assessment: Unchanged  Her updated medication list for this problem includes:    Synthroid  25 Mcg Tabs (Levothyroxine sodium) .Marland Kitchen... Take 1 tablet by mouth once a day  Orders: TSH-[facility] ([id number])   Complete Medication List: 1)  Lisinopril-hydrochlorothiazide 10-12.5 Mg Tabs (Lisinopril-hydrochlorothiazide) .Marland Kitchen.. 1 tab by mouth daily 2)  Synthroid 25 Mcg Tabs (Levothyroxine sodium) .... Take 1 tablet by mouth once a day 3)  Toprol Xl 50 Mg Tb24 (Metoprolol succinate) .... Take 1 tablet by mouth once a day.  please make followup appointment 4)  Zantac 75 75 Mg Tabs (Ranitidine hcl) .... Take 1 tablet by mouth twice a day 5)  Adult Aspirin Ec Low Strength 81 Mg Tbec (Aspirin) .... One tab by mouth qday 6)  Fluticasone Propionate  50 Mcg/act Susp (Fluticasone propionate) .... 2 sprays each nostril daily 7)  Nexium 40 Mg Cpdr (Esomeprazole magnesium) .Marland Kitchen.. 1 tab by mouth daily 8)  Ranitidine Hcl 150 Mg Tabs (Ranitidine hcl) .Marland Kitchen.. 1 tab by mouth two times a day   Patient Instructions: 1)  I will send refills of your medicines to your pharmacy. 2)  Use over the counter medicines with 17-20% salicylic acid in them and paint over the wart on your foot every day - you can put a piece of tape over this to keep the medicine there. 3)  Things that make heartburn worse: fatty foods, chocolate, spicy foods, tomato-based foods, caffeine, smoking, mints, alcohol - reduce or eliminate use of these 4)  Eat small meals 5)  Do not wear tight clothing 6)  Do not eat within 2 hours of going to sleep 7)  Propping the head of your bed up may be helpful as well 8)  Lose weight if you are overweight 9)  The medications for your heartburn: nexium & ranitidine - take both and use tums as needed when you get the burning in your throat. 10)  Call me in a month to let me know how you're doing with this - we may send you to a GI doctor for GERD if you fail this treatment and nothing comes up at your ENT.   Prescriptions: TOPROL XL 50 MG TB24 (METOPROLOL SUCCINATE) Take 1 tablet by mouth once a day.  Please  make followup appointment  #30 x 2   Entered and Authorized by:   Norton Blizzard MD   Signed by:   Norton Blizzard MD on 07/14/2008   Method used:   Electronically to        [id number] (retail)       [address]       [place], [place]  [address]       Ph: [phone] or [phone]       Fax: [phone]   RxID:   [id number] SYNTHROID 25 MCG TABS (LEVOTHYROXINE SODIUM) Take 1 tablet by mouth once a day  #30 x 2   Entered and Authorized by:   Norton Blizzard MD   Signed by:   Norton Blizzard MD on 07/14/2008   Method used:   Electronically to        [id number]* (retail)       [address]       [place], [place]  [address]       Ph: [phone] or [phone]       Fax: [phone]   RxID:   [id number] LISINOPRIL-HYDROCHLOROTHIAZIDE 10-12.5 MG TABS (LISINOPRIL-HYDROCHLOROTHIAZIDE) 1 tab by mouth daily  #30 x 2   Entered and Authorized by:   Norton Blizzard MD   Signed by:   Norton Blizzard MD on 07/14/2008   Method used:   Electronically to        [id number] (retail)       [address]       [place], [place]  [address]       Ph: [phone] or [phone]       Fax: [phone]   RxID:   [id number] RANITIDINE HCL 150 MG TABS (RANITIDINE HCL) 1 tab by mouth two times a day  #60 x 2  Entered and Authorized by:   Norton Blizzard MD   Signed by:   Norton Blizzard MD on 07/14/2008   Method used:   Electronically to        [id number] (retail)       [address]       [place], [place]  [address]       Ph: [phone] or [phone]       Fax: [phone]   RxID:   [id number]

## 2010-06-19 NOTE — Assessment & Plan Note (Signed)
Summary: ear pain,dizzy, sore throat   Vital Signs:  Patient Profile:   55 Years Old Female Height:     65.5 inches Weight:      202.5 pounds Temp:     98.5 degrees F Pulse rate:   74 / minute BP sitting:   150 / 102  (left arm)  Pt. in pain?   yes    Location:   sore throat and earache    Intensity:   8    Type:       aching  Vitals Entered By: Theresia Lo RN (August 17, 2007 9:00 AM)              Is Patient Diabetic? No     History of Present Illness: 2 day hx of runny nose, sinus congestion, right ear pain, sore throat, dry cough.  No fevers, cp/sob. Also with 2 day hx of sensation of room spinning when first sitting up or turning head, associated w/ nausea. Sx last fo 3-5  minutes.  Hx of chronic runny nose and sinus pain.  Denies seasonal allergies.    Prior Medications Reviewed Using: Patient Recall  Current Allergies: No known allergies     Risk Factors:  Tobacco use:  quit    Physical Exam  General:     Well-developed,well-nourished,in no acute distress; alert,appropriate and cooperative throughout examination Head:     + clicking over right TMJ joint Ears:     External ear exam shows no significant lesions or deformities.  Otoscopic examination reveals clear canals, tympanic membranes are intact bilaterally without bulging, retraction, inflammation or discharge. Hearing is grossly normal bilaterally. Nose:     clear rhinorrea, mild erythema Mouth:     very mild erythema, small amount of postnasal drip Neck:     + anterior cervical LAD Lungs:     Normal respiratory effort, chest expands symmetrically. Lungs are clear to auscultation, no crackles or wheezes. Heart:     Normal rate and regular rhythm. S1 and S2 normal without gallop, murmur, click, rub or other extra sounds. Neurologic:     alert & oriented X3, cranial nerves II-XII intact, strength normal in all extremities, gait normal, finger-to-nose normal, and Romberg negative.   +  vertigo w/ dix-hallpike maneuver, no nystagmus though    Impression & Recommendations:  Problem # 1:  URI (ICD-465.9) Assessment: New URI. No evidence of pneumonia. Significan nasal congestion, treat with OTC cold meds as well as astelin nasal spray. Sounds like she may have some chonic rhinitis, consider addition of nasal steroid at f/u visits. Her updated medication list for this problem includes:    Adult Aspirin Ec Low Strength 81 Mg Tbec (Aspirin) ..... One tab by mouth qday  Orders: [facility]- Est  Level 4 (04540)   Problem # 2:  VERTIGO (ICD-780.4) Assessment: New Normal neuro exam, sx brought on w/ dix-hallpike maneuver. Will treat w/ as needed antivert. Her updated medication list for this problem includes:    Antivert 25 Mg Tabs (Meclizine hcl) .Marland Kitchen... 1 by mouth 2-3x daily as needed  Orders: [facility]- Est  Level 4 (99214)   Problem # 3:  EAR PAIN (ICD-388.70) No evidence of AOM or effusion.  Possibly due to TMJ dysfunction vs. congestion with URI.  Consider adding NSAID if sx persist once URI resolved. Orders: [facility]- Est  Level 4 (98119)   Complete Medication List: 1)  Hydrochlorothiazide 25 Mg Tabs (Hydrochlorothiazide) .... Take 1 tablet by mouth once a day 2)  Synthroid 25  Mcg Tabs (Levothyroxine sodium) .... Take 1 tablet by mouth once a day 3)  Toprol Xl 50 Mg Tb24 (Metoprolol succinate) .... Take 1 tablet by mouth once a day 4)  Zantac 75 75 Mg Tabs (Ranitidine hcl) .... Take 1 tablet by mouth twice a day 5)  Adult Aspirin Ec Low Strength 81 Mg Tbec (Aspirin) .... One tab by mouth qday 6)  Astelin 137 Mcg/spray Soln (Azelastine hcl) .Marland Kitchen.. 1 spray each nostril twice daily 7)  Antivert 25 Mg Tabs (Meclizine hcl) .Marland Kitchen.. 1 by mouth 2-3x daily as needed  Other Orders: Rapid Strep-[facility] ([address])   Patient Instructions: 1)  Take antivert 2-3x daily as needed for vertigo 2)  Use astelin nasal spray for nasal congestion 3)  follow up with dr. Iven Finn in 1-2 weeks as previously  discussed    Prescriptions: ANTIVERT 25 MG  TABS (MECLIZINE HCL) 1 by mouth 2-3x daily as needed  #30 x 3   Entered and Authorized by:   Benn Moulder MD   Signed by:   Benn Moulder MD on 08/17/2007   Method used:   Print then Give to Patient   RxID:   [id number] ASTELIN 137 MCG/SPRAY SOLN (AZELASTINE HCL) 1 spray each nostril twice daily  #1 x 1   Entered and Authorized by:   Benn Moulder MD   Signed by:   Benn Moulder MD on 08/17/2007   Method used:   Print then Give to Patient   RxID:   [id number]  ]  Vital Signs:  Patient Profile:   55 Years Old Female Height:     65.5 inches Weight:      202.5 pounds Temp:     98.5 degrees F Pulse rate:   74 / minute BP sitting:   150 / 102    Location:   sore throat and earache    Intensity:   8    Type:       aching                Laboratory Results  Date/Time Received: August 17, 2007 9:14 AM  Date/Time Reported: August 17, 2007 10:08 AM   Other Tests  Rapid Strep: negative Comments: ...................................................................DONNA LORING  August 17, 2007 10:08 AM

## 2010-06-19 NOTE — Consult Note (Signed)
Summary: [facility] - to have EGD, colonoscopy  [facility]   Imported By: De Nurse 10/26/2008 14:30:04  _____________________________________________________________________  External Attachment:    Type:   Image     Comment:   External Document

## 2010-06-19 NOTE — Progress Notes (Signed)
Summary: requesting alternate rx  Phone Note Call from Patient Call back at Home Phone [phone]   Reason for Call: Talk to Nurse Summary of Call: pt is requesting an alternate rx for the nasal spray, sts it was $95, pt goes to [facility] Initial call taken by: Knox Royalty,  August 18, 2007 2:10 PM  Follow-up for Phone Call        Will forward to MD Follow-up by: ASHA BENTON LPN,  August 18, 2007 2:25 PM  Additional Follow-up for Phone Call Additional follow up Details #1::        all prescription nasal sprays will be that price. She can try OTC nasal saline spray or Afrin (but can only use Afrin up to 5 days in a row due to risk of rebound symptoms) Additional Follow-up by: Altamese Cabal MD,  August 18, 2007 3:23 PM    Additional Follow-up for Phone Call Additional follow up Details #2::    Pt. contacted and stated she understood.  Stated she is going to hold rx and use it when her Medicaid goes thru Follow-up by: Starleen Blue RN,  August 18, 2007 4:39 PM

## 2010-06-19 NOTE — Letter (Signed)
Summary: [facility]  Pt has appt on 09/26/2006 @ 8:00am at [facility].  Hardcopy in [employer] box. left message 4/18 left message 4/21  Pt called to confirm appt

## 2010-06-19 NOTE — Assessment & Plan Note (Signed)
Summary: f/u thyroid/ls   Vital Signs:  Patient Profile:   55 Years Old Female Height:     65.5 inches Weight:      199 pounds BMI:     32.73 BSA:     1.99 Temp:     98.9 degrees F oral Pulse rate:   52 / minute BP sitting:   156 / 86  Pt. in pain?   no  Vitals Entered By: Jone Baseman CMA (September 02, 2006 8:43 AM)              Is Patient Diabetic? No   Chief Complaint:  F/U thyroid.  History of Present Illness: hypothyroid- now on replacement w/o adverse effects.  no tachypalpitations, no short of breath, no cp, no neck discomfort/mass.  no heat intolerance.    diarrhea-  usually after dairy intake.  no blood.  no vomiting.  "Loose stools" with bloating.  usually self-resolves.  visual changes- see prev note.  no acute changes.  will have follow up with ophtho soon- on top of waiting list.    Past Medical History:    Reviewed history from 07/17/2006 and no changes required:       C5-6 disk disease, elevated LFTs - 790.4, h/o multiple pyelonephritis infections as child, High-risk EtOH use, Previous smoker, Takes Animator OTC   Family History:    Reviewed history from 07/17/2006 and no changes required:       Father lung cancer, CAD (mult MI) first age 75, HTN, high cholesterol.  Mom had emphysema, d/o ALS.  Mom had mastectomy for `calcifications`., Mom -emphysema, Ca deposit in br s/p mastectomy  Social History:    Reviewed history from 07/17/2006 and no changes required:       married with 2 children, 5 grandchildren.  No tobacco.  4-5 beers/night.  Occasional marijuana.  No cocaine.  Works 2 jobs (60 hrs/wk).  Works part-time at [employer] and [employer].  No DUIs   Risk Factors:  Tobacco use:  never   Review of Systems       The patient complains of abdominal pain.  The patient denies anorexia, fever, weight loss, hoarseness, chest pain, dyspnea on exhertion, peripheral edema, prolonged cough, melena, hematochezia, and severe indigestion/heartburn.      Physical Exam  General:     GEN: nad, alert and oriented HEENT: ncat, mmm NECK: supple, no LA, no goiter appreciated. CV: rrr PULM: ctab, no inc wob ABD: soft, +bs, no masses, no hsm appreciated EXT: no edema SKIN: no acute rash     Impression & Recommendations:  Problem # 1:  HYPOTHYROIDISM, UNSPECIFIED (ICD-244.9) Assessment: Improved cont low dose replacement and recheck in  ~8 wks to make sure patient isn't overcorrected. if wnl, pt will need TSh q year, sooner if symptoms present.  tolerating medication well.  Orders: [facility]- Est Level  3 ([address])   Problem # 2:  LACTOSE INTOLERANCE (ICD-271.3) Assessment: New avoid dairy for  ~2wks and report symptoms.  given reassuring exam and typical hx, will not work up at this time.  if symptoms persist, will complete exhaustive work up.  patient agrees with plan. Orders: [facility]- Est Level  3 (60454)   Problem # 3:  VISUAL DISTURBANCE NOS (ICD-368.9) Assessment: Unchanged will follow up with ophtho soon. no acute changes. Orders: [facility]- Est Level  3 (09811)    Patient Instructions: 1)  Take your prescription to [facility]; they should have it for $4/month.  You need to get your TSH checked in  ~  2 months; you can schedule that appointment with the lab today if you like.  Let me know if your diarrhea doesn't get better after your cut milk and dairy out of your diet.  Otherwise, please schedule a follow-up appointment in 4 months.

## 2010-06-19 NOTE — Assessment & Plan Note (Signed)
Summary: f/u chronic issues- switched allegra to zyrtec   Vital Signs:  Patient profile:   55 year old female Height:      65.5 inches Weight:      200 pounds BMI:     32.89 Temp:     97.8 degrees F Pulse rate:   76 / minute BP sitting:   134 / 90  (left arm) Cuff size:   regular  Vitals Entered By: Dennison Nancy RN (February 06, 2009 9:23 AM) CC: f/u chronic issues Is Patient Diabetic? No Pain Assessment Patient in pain? no        Primary Care Provider:  Marisue Ivan  MD  CC:  f/u chronic issues.  History of Present Illness: 55yo F here for f/u chronic medical issues.  "tickling of the throat"- Has been attributed to allergies.  She has had a complete w/u by ENT and GI.  Recent EGD was nl.  She has added zyrtec to her med list and notes a little improvement.  No difficulty swallowing, breathing, or coughing up of blood.  HTN: No adverse effects from medication.  Not checking it regularly (SBPs 150s).  Was well controlled at last visit.  No dizziness, HA, CP, palpitations, or swelling.  GERD: Well controlled on current meds.  States that coffee exacerbates her symptoms.  No melena or hemoptysis.  Hypothyroid: Tolerating medication.  No side effects.  No abnl wt loss or gain.  No temp instability.    Obesity: Exercises regularly.  Not adherent to low fat diet.  Desires to lose weight.  Preventative: No hx of breast cancer in the family.  All of her pap smears have been nl per patient.  Recent colonoscopy was normal.  Desires flu shot when available.   Habits & Providers  Alcohol-Tobacco-Diet     Tobacco Status: quit < 6 months     Tobacco Counseling: not to resume use of tobacco products  Current Medications (verified): 1)  Lisinopril-Hydrochlorothiazide 20-25 Mg Tabs (Lisinopril-Hydrochlorothiazide) .Marland Kitchen.. 1 Tab By Mouth Daily 2)  Synthroid 25 Mcg Tabs (Levothyroxine Sodium) .... Take 1 Tablet By Mouth Once A Day 3)  Toprol Xl 50 Mg Tb24 (Metoprolol Succinate)  .... Take 1 Tablet By Mouth Once A Day.  Please Make Followup Appointment 4)  Adult Aspirin Ec Low Strength 81 Mg  Tbec (Aspirin) .... One Tab By Mouth Qday 5)  Fluticasone Propionate 50 Mcg/act Susp (Fluticasone Propionate) .... 2 Sprays Each Nostril Daily 6)  Nexium 40 Mg Cpdr (Esomeprazole Magnesium) .Marland Kitchen.. 1 Tab By Mouth Two Times A Day 7)  Ranitidine Hcl 150 Mg Tabs (Ranitidine Hcl) .Marland Kitchen.. 1 Tab By Mouth Two Times A Day 8)  Zyrtec Allergy 10 Mg Tabs (Cetirizine Hcl) .... One Tablet By Mouth Daily 9)  Singulair 10 Mg Tabs (Montelukast Sodium) .Marland Kitchen.. 1 Tab By Mouth Daily For Allergies 10)  Ventolin Hfa 108 (90 Base) Mcg/act Aers (Albuterol Sulfate) .... 2 Puffs Every 4 Hours As Needed For Wheezing  Allergies (verified): No Known Drug Allergies  Social History: Smoking Status:  quit < 6 months  Review of Systems       No dizziness, HA, CP, palpitations, or swelling. No melena or hemoptysis. No abnl wt loss or gain.  No temp instability.    Physical Exam  General:  VS reviewed.  Obese, NAD. Head:  normocephalic.   Eyes:  no injection of the conjunctiva, no tearing Nose:  no drainage Mouth:  Oral mucosa and oropharynx without lesions or exudates.  No erythema.  Teeth in good repair. Lungs:  Normal respiratory effort, chest expands symmetrically. Lungs are clear to auscultation, no crackles or wheezes. Heart:  Normal rate and regular rhythm. S1 and S2 normal without gallop, murmur, click, rub or other extra sounds. Extremities:  no edema   Impression & Recommendations:  Problem # 1:  OTHER SYMPTOMS INVOLVING HEAD AND NECK (ICD-784.99) Assessment Improved  "Tickling of the throat" continues but she has had a thorough work up.  No etiology noted.  I think that this may still be related to her allergic rhinitis.  She is already on a 2nd generation anti-histamine, singulair, and nasal steroid.  Plan to switch her from Allegra to zyrtec for 2 week trial.  If no improvement, she will call me  and we'll consider switching her nasal steroid.  We will try that for 2 weeks and if no improvement, consider referral to allergist.  Orders: [facility]- Est  Level 4 (40981)  Problem # 2:  HYPERTENSION, BENIGN SYSTEMIC (ICD-401.1) Assessment: Unchanged  At goal (<140/90).  No changes to meds.  Refills provided.  BMET recently checked.     Her updated medication list for this problem includes:    Lisinopril-hydrochlorothiazide 20-25 Mg Tabs (Lisinopril-hydrochlorothiazide) .Marland Kitchen... 1 tab by mouth daily    Toprol Xl 50 Mg Tb24 (Metoprolol succinate) .Marland Kitchen... Take 1 tablet by mouth once a day.  please make followup appointment  Orders: [facility]- Est  Level 4 (19147)  Problem # 3:  GASTROESOPHAGEAL REFLUX, NO ESOPHAGITIS (ICD-530.81) Assessment: Unchanged  Well controlled.  No changes to meds.   Her updated medication list for this problem includes:    Nexium 40 Mg Cpdr (Esomeprazole magnesium) .Marland Kitchen... 1 tab by mouth two times a day    Ranitidine Hcl 150 Mg Tabs (Ranitidine hcl) .Marland Kitchen... 1 tab by mouth two times a day  Orders: [facility]- Est  Level 4 (82956)  Problem # 4:  OBESITY, NOS (ICD-278.00) Assessment: Improved  4lb wt loss in past 3 months.  Encouraged and praised on wt loss.    Orders: [facility]- Est  Level 4 (99214)  Problem # 5:  HYPOTHYROIDISM, UNSPECIFIED (ICD-244.9) Assessment: Unchanged  TSH checked within the past year and at nl levels.  No changes to meds.     Her updated medication list for this problem includes:    Synthroid 25 Mcg Tabs (Levothyroxine sodium) .Marland Kitchen... Take 1 tablet by mouth once a day  Orders: [facility]- Est  Level 4 (21308)  Problem # 6:  Preventive Health Care (ICD-V70.0) Assessment: Comment Only Pt up to date on all screening.  Will check pap smear every 3 years and mammograms every 2 years.  She will need a flu vaccine next month.    Complete Medication List: 1)  Lisinopril-hydrochlorothiazide 20-25 Mg Tabs (Lisinopril-hydrochlorothiazide) .Marland Kitchen.. 1 tab by mouth  daily 2)  Synthroid 25 Mcg Tabs (Levothyroxine sodium) .... Take 1 tablet by mouth once a day 3)  Toprol Xl 50 Mg Tb24 (Metoprolol succinate) .... Take 1 tablet by mouth once a day.  please make followup appointment 4)  Adult Aspirin Ec Low Strength 81 Mg Tbec (Aspirin) .... One tab by mouth qday 5)  Fluticasone Propionate 50 Mcg/act Susp (Fluticasone propionate) .... 2 sprays each nostril daily 6)  Nexium 40 Mg Cpdr (Esomeprazole magnesium) .Marland Kitchen.. 1 tab by mouth two times a day 7)  Ranitidine Hcl 150 Mg Tabs (Ranitidine hcl) .Marland Kitchen.. 1 tab by mouth two times a day 8)  Zyrtec Allergy 10 Mg Tabs (Cetirizine hcl) .... One tablet  by mouth daily 9)  Singulair 10 Mg Tabs (Montelukast sodium) .Marland Kitchen.. 1 tab by mouth daily for allergies 10)  Ventolin Hfa 108 (90 Base) Mcg/act Aers (Albuterol sulfate) .... 2 puffs every 4 hours as needed for wheezing  Patient Instructions: 1)  Please schedule a follow-up appointment in 3 months .  2)  Today we switched from Allegra to Zyrtec.  Try it for 2 weeks and call me and let me know how it works.  If it is not working, we will switch the brand of your nasal steroid.   Prescriptions: VENTOLIN HFA 108 (90 BASE) MCG/ACT AERS (ALBUTEROL SULFATE) 2 puffs every 4 hours as needed for wheezing  #1 x 1   Entered and Authorized by:   Marisue Ivan  MD   Signed by:   Marisue Ivan  MD on 02/06/2009   Method used:   Electronically to        [facility]  [address] [id number]* (retail)       [address]       [place], [place]  [address]       Ph: [phone] or [phone]       Fax: [phone]   RxID:   [id number] SINGULAIR 10 MG TABS (MONTELUKAST SODIUM) 1 tab by mouth daily for allergies  #90 x 1   Entered and Authorized by:   Marisue Ivan  MD   Signed by:   Marisue Ivan  MD on 02/06/2009   Method used:   Electronically to        [facility]  [address] [id number]* (retail)       [address]       [place], [place]  [address]       Ph: [phone] or [phone]       Fax: [phone]   RxID:   [id number] RANITIDINE HCL 150 MG TABS (RANITIDINE HCL) 1 tab by mouth two times a day  #180 x 1   Entered and Authorized by:   Marisue Ivan  MD   Signed by:   Marisue Ivan  MD on 02/06/2009   Method used:   Electronically to        [facility]  [address] [id number]* (retail)       [address]       [place], [place]  [address]       Ph: [phone] or [phone]       Fax: [phone]   RxID:   [id number] FLUTICASONE PROPIONATE 50 MCG/ACT SUSP (FLUTICASONE PROPIONATE) 2 sprays each nostril daily  #1 x 1   Entered and Authorized by:   Marisue Ivan  MD   Signed by:   Marisue Ivan  MD on 02/06/2009   Method used:   Electronically to        [facility]  [address] [id number]* (retail)       [address]       [place], [place]  [address]       Ph: [phone] or [phone]       Fax: [phone]   RxID:   [id number] TOPROL XL 50 MG TB24 (METOPROLOL SUCCINATE) Take 1 tablet by mouth once a day.  Please make followup appointment  #90 x 1   Entered and Authorized by:   Marisue Ivan  MD   Signed by:   Marisue Ivan  MD on 02/06/2009   Method used:   Electronically to        [facility]* (retail)       [address]       [place], [place]  [address]       Ph: [phone] or [phone]       Fax: [phone]   RxID:   [id number] SYNTHROID 25 MCG TABS (LEVOTHYROXINE SODIUM) Take 1 tablet by mouth once a day  #90 x 1   Entered and Authorized by:   Marisue Ivan  MD   Signed by:   Marisue Ivan  MD on 02/06/2009   Method used:   Electronically to        [facility]  [address] [facility] (retail)       [address]       [place], [place]  [address]       Ph: [phone] or [phone]       Fax: [phone]   RxID:    [id number] LISINOPRIL-HYDROCHLOROTHIAZIDE 20-25 MG TABS (LISINOPRIL-HYDROCHLOROTHIAZIDE) 1 tab by mouth daily  #180 x 1   Entered and Authorized by:   Marisue Ivan  MD   Signed by:   Marisue Ivan  MD on 02/06/2009   Method used:   Electronically to        [facility]  [address] [facility] (retail)       [address]       [place], [place]  [address]       Ph: [phone] or [phone]       Fax: [phone]   RxID:   [id number] ZYRTEC ALLERGY 10 MG TABS (CETIRIZINE HCL) one tablet by mouth daily  #34 x 1   Entered and Authorized by:   Marisue Ivan  MD   Signed by:   Marisue Ivan  MD on 02/06/2009   Method used:   Electronically to        [facility]  [address] [facility] (retail)       [address]       [place], [place]  [address]       Ph: [phone] or [phone]       Fax: [phone]   RxID:   [id number]    Prevention & Chronic Care Immunizations   Influenza vaccine: Not documented    Tetanus booster: 10/19/2003: Done.   Tetanus booster due: 10/18/2013    Pneumococcal vaccine: Not documented  Colorectal Screening   Hemoccult: Not documented   Hemoccult due: Not Indicated    Colonoscopy: diverticulosis  (11/28/2008)   Colonoscopy due: 11/29/2018  Other Screening   Pap smear:  Specimen Adequacy: Satisfactory for evaluation.   Interpretation/Result:Negative for intraepithelial Lesion or Malignancy.     (09/09/2007)   Pap smear action/deferral: Deferred-3 yr interval  (02/06/2009)   Pap smear due: 09/09/2010    Mammogram: normal  (10/30/2007)   Mammogram action/deferral: Deferred-2 yr interval  (02/06/2009)   Mammogram due: 10/29/2008   Smoking status: quit < 6 months  (02/06/2009)  Lipids   Total Cholesterol: 203  (07/14/2008)   LDL: 124  (07/14/2008)   LDL Direct: Not documented   HDL: 59  (07/14/2008)   Triglycerides: 102  (07/14/2008)  Hypertension  Last Blood Pressure:  134 / 90  (02/06/2009)   Serum creatinine: 0.91  (10/18/2008)   Serum potassium 4.2  (10/18/2008)    Hypertension flowsheet reviewed?: Yes   Progress toward BP goal: At goal  Self-Management Support :   Personal Goals (by the next clinic visit) :      Personal blood pressure goal: 140/90  (02/06/2009)   Patient will work on the following items until the next clinic visit to reach self-care goals:     Medications and monitoring: take my medicines every day, check my blood pressure, bring all of my medications to every visit  (02/06/2009)     Eating: drink diet soda or water instead of juice or soda, eat more vegetables, use fresh or frozen vegetables, eat foods that are low in salt, eat baked foods instead of fried foods, limit or avoid alcohol  (02/06/2009)    Hypertension self-management support: Written self-care plan  (02/06/2009)   Hypertension self-care plan printed.    Prevention & Chronic Care Immunizations   Influenza vaccine: Not documented    Tetanus booster: 10/19/2003: Done.   Tetanus booster due: 10/18/2013    Pneumococcal vaccine: Not documented  Colorectal Screening   Hemoccult: Not documented   Hemoccult due: Not Indicated    Colonoscopy: diverticulosis  (11/28/2008)   Colonoscopy due: 11/29/2018  Other Screening   Pap smear:  Specimen Adequacy: Satisfactory for evaluation.   Interpretation/Result:Negative for intraepithelial Lesion or Malignancy.     (09/09/2007)   Pap smear action/deferral: Deferred-3 yr interval  (02/06/2009)   Pap smear due: 09/09/2010    Mammogram: normal  (10/30/2007)   Mammogram action/deferral: Deferred-2 yr interval  (02/06/2009)   Mammogram due: 10/29/2008   Smoking status: quit < 6 months  (02/06/2009)  Lipids   Total Cholesterol: 203  (07/14/2008)   LDL: 124  (07/14/2008)   LDL Direct: Not documented   HDL: 59  (07/14/2008)   Triglycerides: 102  (07/14/2008)  Hypertension   Last Blood Pressure: 134 / 90   (02/06/2009)   Serum creatinine: 0.91  (10/18/2008)   Serum potassium 4.2  (10/18/2008)    Hypertension flowsheet reviewed?: Yes   Progress toward BP goal: At goal  Self-Management Support :   Personal Goals (by the next clinic visit) :      Personal blood pressure goal: 140/90  (02/06/2009)   Patient will work on the following items until the next clinic visit to reach self-care goals:     Medications and monitoring: take my medicines every day, check my blood pressure, bring all of my medications to every visit  (02/06/2009)     Eating: drink diet soda or water instead of juice or soda, eat more vegetables, use fresh or frozen vegetables, eat foods that are low in salt, eat baked foods instead of fried foods, limit or avoid alcohol  (02/06/2009)    Hypertension self-management support: Written self-care plan  (02/06/2009)   Hypertension self-care plan printed.

## 2010-06-19 NOTE — Miscellaneous (Signed)
  Clinical Lists Changes  Problems: Changed problem from REACTIVE AIRWAY DISEASE (ICD-493.90) to ASTHMA, PERSISTENT (ICD-493.90) 

## 2010-06-19 NOTE — Assessment & Plan Note (Signed)
Summary: routine check up wp  Medications Added HYDROCHLOROTHIAZIDE 25 MG TABS (HYDROCHLOROTHIAZIDE) Take 1 tablet by mouth once a day SYNTHROID 25 MCG TABS (LEVOTHYROXINE SODIUM) Take 1 tablet by mouth once a day TOPROL XL 50 MG TB24 (METOPROLOL SUCCINATE) Take 1 tablet by mouth once a day ZANTAC 75 75 MG TABS (RANITIDINE HCL) Take 1 tablet by mouth twice a day ADULT ASPIRIN EC LOW STRENGTH 81 MG  TBEC (ASPIRIN) one tab by mouth qday        Vital Signs:  Patient Profile:   55 Years Old Female Height:     65.5 inches Weight:      201 pounds Temp:     97.4 degrees F Pulse rate:   57 / minute BP sitting:   144 / 90  Pt. in pain?   no  Vitals Entered By: Jone Baseman CMA (March 18, 2007 10:03 AM)                  Chief Complaint:  routine visit.  History of Present Illness: S: Patient is a 55 y/o female with h/o EtoH abuse, hypothyroidism and HTN here for follow-up 1. hypothyroidism- patient asymptomatic.  No tachycardia, palpitations.  2. HTN- toelrating meds. NO CP, dizziness, HA, SOB, or blurred vision 3. EtoH abuse- patient has 5-6 drinks every weeknight and more on weekend. Answered no to all 4 CAGE questions.  Denies legal or personal problems related to alcohol use and not interested in quitting. 4. Health maintence- last pap and mammogram 10 months ago. Declines flu shot today. Agrees to sigmoidoscopy in a few months. 5. ear pain/ patient has had bilateral intermitant ear pain x 3-4 months. No fevers. no difficulty hearing    Past Medical History:    Reviewed history from 07/17/2006 and no changes required:       C5-6 disk disease, elevated LFTs - 790.4, h/o multiple pyelonephritis infections as child, High-risk EtOH use, Previous smoker, [employer] OTC  Past Surgical History:    Reviewed history from 07/17/2006 and no changes required:       Appendectomy - 10/15/2000, Cr 0.6, LFTs OK - 06/06/2005, gc/chlam neg - 06/06/2005, Lipoma removal right axilla as  child - 10/15/2000, PFTs-Ratio 94% - 10/12/2004, T & A - 10/15/2000, transverse C/S w/BTL - 10/15/2000, TSH 7.073 at 11/05 - 5.719 1/07 - 06/06/2005   Family History:    Reviewed history from 07/17/2006 and no changes required:       Father lung cancer, CAD (mult MI) first age 5, HTN, high cholesterol.  Mom had emphysema, d/o ALS.  Mom had mastectomy for `calcifications`., Mom -emphysema, Ca deposit in br s/p mastectomy  Social History:    Reviewed history from 07/17/2006 and no changes required:       married with 2 children, 5 grandchildren.  No tobacco.  4-5 beers/night.  Occasional marijuana.  No cocaine.  Works 2 jobs (60 hrs/wk).  Works part-time at [employer] and [employer].  No DUIs     Physical Exam  General:     Well-developed,well-nourished,in no acute distress; alert,appropriate and cooperative throughout examination Head:     normocephalic and atraumatic.   Eyes:     pupils round and pupils reactive to light.   Ears:     R ear normal and L ear normal.   Nose:     External nasal examination shows no deformity or inflammation. Nasal mucosa are pink and moist without lesions or exudates. Mouth:     Oral mucosa  and oropharynx without lesions or exudates.  Teeth in good repair. Neck:     No deformities, masses, or tenderness noted. no thyromegaly Lungs:     Normal respiratory effort, chest expands symmetrically. Lungs are clear to auscultation, no crackles or wheezes. Heart:     Normal rate and regular rhythm. S1 and S2 normal without gallop, murmur, click, rub or other extra sounds. Abdomen:     Bowel sounds positive,abdomen soft and non-tender without masses, organomegaly or hernias noted. Msk:     No deformity or scoliosis noted of thoracic or lumbar spine.   Extremities:     No clubbing, cyanosis, edema, or deformity noted with normal full range of motion of all joints.   Skin:     Intact without suspicious lesions or rashes Psych:     Cognition and judgment appear  intact. Alert and cooperative with normal attention span and concentration. No apparent delusions, illusions, hallucinations    Impression & Recommendations:  Problem # 1:  HYPERTENSION, BENIGN SYSTEMIC (ICD-401.1) Assessment: Unchanged Continue current meds. Orders: [facility]- Est  Level 4 (66063)  Future Orders: Lipid-[facility] ([id number]) ... 02/27/2008 Comp Met-[facility] ([id number]) ... 02/27/2008  Her updated medication list for this problem includes:    Hydrochlorothiazide 25 Mg Tabs (Hydrochlorothiazide) .Marland Kitchen... Take 1 tablet by mouth once a day    Toprol Xl 50 Mg Tb24 (Metoprolol succinate) .Marland Kitchen... Take 1 tablet by mouth once a day   Problem # 2:  HYPOTHYROIDISM, UNSPECIFIED (ICD-244.9) Assessment: Unchanged Cont current dose of synthroid.  Will recheck in TSH Her updated medication list for this problem includes:    Synthroid 25 Mcg Tabs (Levothyroxine sodium) .Marland Kitchen... Take 1 tablet by mouth once a day   Problem # 3:  EAR PAIN (ICD-388.70) Assessment: New Nl exam today.  May be eustachian tube dysfunction. Recommnended deconngestant nasal spray. Orders: [facility]- Est  Level 4 ([id number])   Problem # 4:  ALCOHOL ABUSE, UNSPECIFIED (ICD-305.00) Assessment: Unchanged Patient denies problem. Will check hgB and LFTs Orders: [facility]- Est  Level 4 (37628)  Future Orders: CBC-[facility] (31517) ... 02/27/2008   Problem # 5:  Preventive Health Care (ICD-V70.0) Assessment: Comment Only Patient declines flu shot. Willing to set up sigmioidoscopy in a few months. Pap and mammogram due in January  Complete Medication List: 1)  Hydrochlorothiazide 25 Mg Tabs (Hydrochlorothiazide) .... Take 1 tablet by mouth once a day 2)  Synthroid 25 Mcg Tabs (Levothyroxine sodium) .... Take 1 tablet by mouth once a day 3)  Toprol Xl 50 Mg Tb24 (Metoprolol succinate) .... Take 1 tablet by mouth once a day 4)  Zantac 75 75 Mg Tabs (Ranitidine hcl) .... Take 1 tablet by mouth twice a day 5)  Adult Aspirin Ec Low Strength 81  Mg Tbec (Aspirin) .... One tab by mouth qday   Patient Instructions: 1)  use afrin to help decongest ears (dont use for over 5 days) 2)  come back fasting any day you'd like 3)  buy a pedometer and try to get 10,000 steps a day 4)  call the [facility] in December to schedule sigmoidoscopy    ]

## 2010-06-19 NOTE — Miscellaneous (Signed)
Summary: prior auth for nexium  Clinical Lists Changes pa for nexium to pcp chart box.Golden Circle RN  September 05, 2009 10:19 AM  PA completed....Marland KitchenMarland KitchenMarisue Ivan, MD

## 2010-06-19 NOTE — Assessment & Plan Note (Signed)
Summary: fu wp   Vital Signs:  Patient Profile:   55 Years Old Female Height:     65.5 inches Weight:      210 pounds Temp:     97.9 degrees F Pulse rate:   67 / minute BP sitting:   131 / 88  Pt. in pain?   no  Vitals Entered By: Jone Baseman CMA (January 28, 2008 8:37 AM)                   PCP:  Norton Blizzard MD  Chief Complaint:  F/U BP.  History of Present Illness: Hypertension Meds: lisinopril/hctz, toprol xl Taking and tolerating? yes Home BPs: none Chest pain: no Dyspnea:no  GERD Meds: prilosec daily, tums as needed Triggers: alcohol  ~6 beers a day - does not want to cut down but denies addiction, 2 cups coffee/day.  Eats before bed.  Colonoscopy - scheduled for october  Allergic rhinitis - was using afrin more than 5 days straight, now knows not to do this.  Also using coricidin hbp.  Was given flonase script but states this was too expensive - ? if got non-generic.  + postnasal drip.  Lung nodule - had bilateral pneumonia as child which may have led to scarring (more scarring than nodule likely).  CT ordered and she will get this soon.    Updated Prior Medication List: LISINOPRIL-HYDROCHLOROTHIAZIDE 10-12.5 MG TABS (LISINOPRIL-HYDROCHLOROTHIAZIDE) 1 tab by mouth daily SYNTHROID 25 MCG TABS (LEVOTHYROXINE SODIUM) Take 1 tablet by mouth once a day TOPROL XL 50 MG TB24 (METOPROLOL SUCCINATE) Take 1 tablet by mouth once a day ADULT ASPIRIN EC LOW STRENGTH 81 MG  TBEC (ASPIRIN) one tab by mouth qday FLUTICASONE PROPIONATE 50 MCG/ACT SUSP (FLUTICASONE PROPIONATE) 2 sprays each nostril daily ANTIVERT 25 MG  TABS (MECLIZINE HCL) 1 by mouth 2-3x daily as needed PRILOSEC OTC 20 MG TBEC (OMEPRAZOLE MAGNESIUM) 1 tab by mouth daily  Current Allergies (reviewed today): No known allergies       Physical Exam  General:     Gen: Well-developed, well-nourished, NAD, alert and cooperative HEENT: NCAT, Rosacea noted, PERRL, EOMI, no conjunctival  inflammation, pharynx nl without erythema or exudates, MMM. External ear exam nl - TMs without bulging or erythema.  No nasal d/c Neck: No LAN, thyromegaly, masses. CV: RRR, no MRG Lungs: Nl resp effort. CTAB no wheezes, rales, rhonchi Abd: soft, nt, nd.  No HSM. Skin: No rashes or lesions on visible skin    Impression & Recommendations:  Problem # 1:  HYPERTENSION, BENIGN SYSTEMIC (ICD-401.1) Assessment: Improved At goal of <140/90.  Continue meds.  Check BMP today to assess kidney function and K.  Her updated medication list for this problem includes:    Lisinopril-hydrochlorothiazide 10-12.5 Mg Tabs (Lisinopril-hydrochlorothiazide) .Marland Kitchen... 1 tab by mouth daily    Toprol Xl 50 Mg Tb24 (Metoprolol succinate) .Marland Kitchen... Take 1 tablet by mouth once a day  Orders: [facility] ([address]) [facility]- Est  Level 4 ([address])   Problem # 2:  GASTROESOPHAGEAL REFLUX, NO ESOPHAGITIS (ICD-530.81) Assessment: Deteriorated Lifestyle modifications key in Ms. Warga.  Alcohol, caffeine, eating before bed causing symptoms to be uncontrolled.  Advised her to keep a journal of when things tend to be at worst and what she had done differently that day.  Her updated medication list for this problem includes:    Zantac 75 75 Mg Tabs (Ranitidine hcl) .Marland Kitchen... Take 1 tablet by mouth twice a day    Prilosec Otc 20 Mg Tbec (  Omeprazole magnesium) .Marland Kitchen... 1 tab by mouth daily  Orders: [facility]- Est  Level 4 ([address])   Problem # 3:  LUNG NODULE (ICD-518.89) Assessment: Unchanged Will get CT scan. Orders: [facility]- Est  Level 4 ([address])   Problem # 4:  ALLERGIC RHINITIS (ICD-477.9) Assessment: Deteriorated Change to generic fluticasone from flonase.  Can try by mouth antihistamine as well but most of her symptoms are nasal so feel this should work for her.  Her updated medication list for this problem includes:    Fluticasone Propionate 50 Mcg/act Susp (Fluticasone propionate) .Marland Kitchen... 2 sprays each nostril  daily  Orders: [facility]- Est  Level 4 ([address])   Complete Medication List: 1)  Lisinopril-hydrochlorothiazide 10-12.5 Mg Tabs (Lisinopril-hydrochlorothiazide) .Marland Kitchen.. 1 tab by mouth daily 2)  Synthroid 25 Mcg Tabs (Levothyroxine sodium) .... Take 1 tablet by mouth once a day 3)  Toprol Xl 50 Mg Tb24 (Metoprolol succinate) .... Take 1 tablet by mouth once a day 4)  Zantac 75 75 Mg Tabs (Ranitidine hcl) .... Take 1 tablet by mouth twice a day 5)  Adult Aspirin Ec Low Strength 81 Mg Tbec (Aspirin) .... One tab by mouth qday 6)  Fluticasone Propionate 50 Mcg/act Susp (Fluticasone propionate) .... 2 sprays each nostril daily 7)  Antivert 25 Mg Tabs (Meclizine hcl) .Marland Kitchen.. 1 by mouth 2-3x daily as needed 8)  Prilosec Otc 20 Mg Tbec (Omeprazole magnesium) .Marland Kitchen.. 1 tab by mouth daily  Other Orders: [facility] ([id number])   Patient Instructions: 1)  Things that make heartburn worse: fatty foods, chocolate, spicy foods, tomato-based foods, caffeine, smoking, mints, alcohol - reduce or eliminate use of these 2)  Eat small meals 3)  Do not wear tight clothing 4)  Do not eat within 2 hours of going to sleep 5)  Propping the head of your bed up may be helpful as well 6)  Lose weight if you are overweight 7)  The medication for your heartburn is prilosec and zantac.  Fill the zantac only if you make the changes above and reflux is still a problem. 8)  You can just walk over to [facility] anytime and get your chest CT. 9)  Use the nasal spray we gave you until you run out then fill the script for the flonase - use this every day. 10)  We will contact you with your lab results and CT result. 11)  Follow up with me in 2-3 months.   Prescriptions: ZANTAC 75 75 MG TABS (RANITIDINE HCL) Take 1 tablet by mouth twice a day  #60 x 5   Entered and Authorized by:   Norton Blizzard MD   Signed by:   Norton Blizzard MD on 01/28/2008   Method used:   Print then Give to Patient   RxID:   [id number] FLUTICASONE  PROPIONATE 50 MCG/ACT SUSP (FLUTICASONE PROPIONATE) 2 sprays each nostril daily  #1 x 11   Entered and Authorized by:   Norton Blizzard MD   Signed by:   Norton Blizzard MD on 01/28/2008   Method used:   Print then Give to Patient   RxID:   [id number]  ]

## 2010-06-19 NOTE — Assessment & Plan Note (Signed)
Summary: 55yo F wellness visit   Vital Signs:  Patient profile:   55 year old female Height:      64.75 inches Weight:      200.9 pounds BMI:     33.81 Temp:     97.7 degrees F oral Pulse rate:   70 / minute BP sitting:   152 / 106  (right arm) Cuff size:   large  Vitals Entered By: Gladstone Pih (May 02, 2009 8:35 AM)  Serial Vital Signs/Assessments:  Time      Position  BP       Pulse  Resp  Temp     By                     140/92                         Marisue Ivan  MD  CC: 55yo F wellness visit Is Patient Diabetic? No Pain Assessment Patient in pain? no        Primary Care Provider:  Marisue Ivan  MD  CC:  [phone] F wellness visit.  History of Present Illness: 54yo F here for wellness visit  Concerns: She has experienced severe coughing while on the lisinopril.  She came off of it for 5 days and the symptoms improved.  Since then, she has gone back on the medication.  Wants to know if she can stop the medication.  Gyn: Post-menopausal.    Preventative: Last Colonoscopy was 11/2008 which showed diverticulosis.  Last Mammogram was 10/2007 which was normal.  Last pap smear was 08/2007 which was also normal.  She has already received the flu vaccination.    Habits & Providers  Alcohol-Tobacco-Diet     Tobacco Status: quit     Tobacco Counseling: to quit use of tobacco products     Year Quit: 1990  Current Medications (verified): 1)  Hydrochlorothiazide 25 Mg  Tabs (Hydrochlorothiazide) .... Take 1 Tab By Mouth Every Morning 2)  Synthroid 25 Mcg Tabs (Levothyroxine Sodium) .... Take 1 Tablet By Mouth Once A Day 3)  Metoprolol Succinate 100 Mg Xr24h-Tab (Metoprolol Succinate) .Marland Kitchen.. 1 Tablet By Mouth Daily For High Blood Pressure 4)  Adult Aspirin Ec Low Strength 81 Mg  Tbec (Aspirin) .... One Tab By Mouth Qday 5)  Fluticasone Propionate 50 Mcg/act Susp (Fluticasone Propionate) .... 2 Sprays Each Nostril Daily 6)  Nexium 40 Mg Cpdr (Esomeprazole Magnesium)  .Marland Kitchen.. 1 Tab By Mouth Two Times A Day 7)  Ranitidine Hcl 150 Mg Tabs (Ranitidine Hcl) .Marland Kitchen.. 1 Tab By Mouth Two Times A Day 8)  Zyrtec Allergy 10 Mg Tabs (Cetirizine Hcl) .... One Tablet By Mouth Daily 9)  Singulair 10 Mg Tabs (Montelukast Sodium) .Marland Kitchen.. 1 Tab By Mouth Daily For Allergies 10)  Ventolin Hfa 108 (90 Base) Mcg/act Aers (Albuterol Sulfate) .... 2 Puffs Every 4 Hours As Needed For Wheezing 11)  Qvar 80 Mcg/act Aers (Beclomethasone Dipropionate) .... 2 Puffs Daily  Allergies (verified): 1)  Ace Inhibitors  Social History: Smoking Status:  quit  Review of Systems       No abnl unintentional wt loss, no hemoptysis, no melena or bloody stools  Physical Exam  General:  Vs reviewed and rechecked. Well appearing, NAD.   Head:  Normocephalic and atraumatic without obvious abnormalities. Eyes:  No corneal or conjunctival inflammation noted. EOMI. Perrla. Funduscopic exam benign, without hemorrhages, exudates or papilledema. Vision grossly normal. Nose:  External  nasal examination shows no deformity or inflammation. Nasal mucosa are pink and moist without lesions or exudates. Mouth:  Oral mucosa and oropharynx without lesions or exudates.  Teeth in good repair. Neck:  supple Full ROM no goiter or mass Lungs:  Normal respiratory effort, chest expands symmetrically. Lungs are clear to auscultation, no crackles or wheezes. Heart:  Normal rate and regular rhythm. S1 and S2 normal without gallop, murmur, click, rub or other extra sounds. Abdomen:  Bowel sounds positive,abdomen soft and non-tender without masses, organomegaly or hernias noted. Genitalia:  Bimanual- no cervical motion tenderness or adnexal mass Msk:  No joint deformities, loss of ROM, or swelling Pulses:  2+ radial and post tib Extremities:  no edema Neurologic:  alert & oriented X3, cranial nerves II-XII intact, strength normal in all extremities, sensation intact to light touch, gait normal, and DTRs symmetrical and normal.    Skin:  color normal.   Cervical Nodes:  No lymphadenopathy noted Axillary Nodes:  No palpable lymphadenopathy   Impression & Recommendations:  Problem # 1:  Preventive Health Care (ICD-V70.0) Assessment Unchanged Up to date on all screening.  Next colonoscopy due in 11/2018.  Next mammogram due 10/2009 (all mammograms have been nl).  Next pap smear due 08/2010 (all pap smears have been nl).  Received the flu shot at the allergist.  Discussed importance of restarting physical activity and wt loss.    Problem # 2:  HYPERTENSION, BENIGN SYSTEMIC (ICD-401.1) Assessment: Deteriorated Not at goal (<140/90).  Lisinopril discontinued b/c of adverse reaction (cough).  Will increase the metoprolol to 100mg  daily and have her check her BP and pulse daily for the next 2 weeks.  She is suppose to call me at that time to let me know the results and I will make adjustments and decide f/u accordingly.     Her updated medication list for this problem includes:    Hydrochlorothiazide 25 Mg Tabs (Hydrochlorothiazide) .Marland Kitchen... Take 1 tab by mouth every morning    Metoprolol Succinate 100 Mg Xr24h-tab (Metoprolol succinate) .Marland Kitchen... 1 tablet by mouth daily for high blood pressure  Complete Medication List: 1)  Hydrochlorothiazide 25 Mg Tabs (Hydrochlorothiazide) .... Take 1 tab by mouth every morning 2)  Synthroid 25 Mcg Tabs (Levothyroxine sodium) .... Take 1 tablet by mouth once a day 3)  Metoprolol Succinate 100 Mg Xr24h-tab (Metoprolol succinate) .Marland Kitchen.. 1 tablet by mouth daily for high blood pressure 4)  Adult Aspirin Ec Low Strength 81 Mg Tbec (Aspirin) .... One tab by mouth qday 5)  Fluticasone Propionate 50 Mcg/act Susp (Fluticasone propionate) .... 2 sprays each nostril daily 6)  Nexium 40 Mg Cpdr (Esomeprazole magnesium) .Marland Kitchen.. 1 tab by mouth two times a day 7)  Ranitidine Hcl 150 Mg Tabs (Ranitidine hcl) .Marland Kitchen.. 1 tab by mouth two times a day 8)  Zyrtec Allergy 10 Mg Tabs (Cetirizine hcl) .... One tablet by mouth  daily 9)  Singulair 10 Mg Tabs (Montelukast sodium) .Marland Kitchen.. 1 tab by mouth daily for allergies 10)  Ventolin Hfa 108 (90 Base) Mcg/act Aers (Albuterol sulfate) .... 2 puffs every 4 hours as needed for wheezing 11)  Qvar 80 Mcg/act Aers (Beclomethasone dipropionate) .... 2 puffs daily  Other Orders: [facility] - Est  40-64 yrs ([address]69)  Patient Instructions: 1)  I want you to check your blood pressure and pulse everyday for the next 2 weeks and call me at the end of the 2 weeks to let me know the results...we will decide medication adjustments and follow up  at that time. 2)  We stopped the Lisinopril today. 3)  We increased the Metoprolol to 100mg  daily. 4)  I gave you some ankle strengthening exercises to do to prevent further ankle injuries. 5)  Your next colonoscopy is due 11/2018. 6)  Next Mammogram 10/2009 7)  Next Pap smear 08/2010 Prescriptions: VENTOLIN HFA 108 (90 BASE) MCG/ACT AERS (ALBUTEROL SULFATE) 2 puffs every 4 hours as needed for wheezing  #2 x 1   Entered and Authorized by:   Marisue Ivan  MD   Signed by:   Marisue Ivan  MD on 05/02/2009   Method used:   Electronically to        [facility]  [address] # [id number]* (retail)       [address]       [place], [place]  [address]       Ph: [phone] or [phone]       Fax: [phone]   RxID:   [id number] HYDROCHLOROTHIAZIDE 25 MG  TABS (HYDROCHLOROTHIAZIDE) Take 1 tab by mouth every morning  #90 x 1   Entered and Authorized by:   Marisue Ivan  MD   Signed by:   Marisue Ivan  MD on 05/02/2009   Method used:   Electronically to        [facility]  [address] # [id number]* (retail)       [address]       [place], [place]  [address]       Ph: [phone] or [phone]       Fax: [phone]   RxID:   [id number] METOPROLOL SUCCINATE 100 MG XR24H-TAB (METOPROLOL SUCCINATE) 1 tablet by mouth daily for high blood pressure  #30 x 2   Entered and Authorized by:   Marisue Ivan  MD   Signed by:   Marisue Ivan  MD on 05/02/2009   Method used:   Electronically to        [facility]  [address] [id number]* (retail)       [address]       [place], [place]  [address]       Ph: [phone] or [phone]       Fax: [phone]   RxID:   [id number]

## 2010-06-19 NOTE — Letter (Signed)
Summary: Generic Letter  [facility]  [address]   [place], [place] [address]   Phone: [phone]  Fax: [phone]    07/26/2009  Lisa [name] [address] [place], [place]  [address]  Dear [name],  I would like to offer you a trial of decreasing the frequency of the nexium that you are taking.  If you are symptom free, it would be appropriate to cut the frequency back to once a day as needed  for symptoms of reflux and heartburn.  Call me if you have any questions.   Sincerely,   Marisue Ivan  MD  Appended Document: Generic Letter mailed

## 2010-06-19 NOTE — Letter (Signed)
Summary: Generic Letter  [facility]  [address]   [place], [place] [address]   Phone: [phone]  Fax: [phone]    07/18/2008  Lisa [name] [address] [place], [place]  [address]  Dear Ms. Salmons,  Your cholesterol levels and thyroid function test were where we want them which is great.  Follow up as previously discussed.    Sincerely,   Norton Blizzard MD [facility]  Appended Document: Generic Letter mailed

## 2010-06-19 NOTE — Miscellaneous (Signed)
Summary: coumadin  Clinical Lists Changes Carole with [facility] [phone]) called to ask how much longer pt will be on coumadin. message to pcp.Golden Circle RN  April 04, 2009 11:38 AM  Erin Fulling back and states that the surgeon placed her on coumadin after he discovered a DVT? during a above the knee amputation which I was not aware of.  Enid Derry is to contact the surgeon and she is to f/u with me in the near future..........Marland KitchenMarisue Ivan, MD

## 2010-06-19 NOTE — Progress Notes (Signed)
Summary: updated med list  Phone Note Call from Patient Call back at Home Phone [phone]   Caller: Patient Summary of Call: name of inhaler is Ventolin HFA 90mg  Initial call taken by: De Nurse,  August 28, 2009 1:43 PM  Follow-up for Phone Call        pt just letting you know name she could not remember at her apt Follow-up by: Gladstone Pih,  August 29, 2009 4:05 PM  Additional Follow-up for Phone Call Additional follow up Details #1::        noted Additional Follow-up by: Marisue Ivan  MD,  August 30, 2009 9:03 AM    New/Updated Medications: VENTOLIN HFA 108 (90 BASE) MCG/ACT AERS (ALBUTEROL SULFATE) 2 puffs every 4hours as needed for wheezing

## 2010-06-19 NOTE — Progress Notes (Signed)
Summary: phn msg/called pt/fyi/ts  Phone Note Call from Patient Call back at Home Phone [phone]   Caller: Patient Summary of Call: was given new meds for throat and wanted to let let them know how she is doing Initial call taken by: De Nurse,  August 08, 2008 2:28 PM  Follow-up for Phone Call        called pt. reports that ranitine has improved her reflux on a level of 3/10. not much better. has appt with ENT 08-29-08. fwd. to Dr.Mixtli Reno for review Follow-up by: Arlyss Repress CMA,,  August 08, 2008 3:47 PM  Additional Follow-up for Phone Call Additional follow up Details #1::        Great, thanks for the update!  Glad she's doing better... Additional Follow-up by: Norton Blizzard MD,  August 10, 2008 8:27 AM

## 2010-06-19 NOTE — Miscellaneous (Signed)
Summary: Change to proair inhaler due to [employer] will not pain for ventolin.  therefore will change to proair....Marland KitchenMarland KitchenMarisue Ivan, MD  Clinical Lists Changes  Medications: Changed medication from VENTOLIN HFA 108 (90 BASE) MCG/ACT AERS (ALBUTEROL SULFATE) 2 puffs every 4 hours as needed for wheezing to PROAIR HFA 108 (90 BASE) MCG/ACT AERS (ALBUTEROL SULFATE) 2 puffs q4h as needed wheezing - Signed Rx of PROAIR HFA 108 (90 BASE) MCG/ACT AERS (ALBUTEROL SULFATE) 2 puffs q4h as needed wheezing;  #1 x 5;  Signed;  Entered by: Marisue Ivan  MD;  Authorized by: Marisue Ivan  MD;  Method used: Electronically to [facility]  [address] # [id number]*, [address] [place], [place], [place]  [address], Ph: [phone] or [phone], Fax: [phone]    Prescriptions: PROAIR HFA 108 (90 BASE) MCG/ACT AERS (ALBUTEROL SULFATE) 2 puffs q4h as needed wheezing  #1 x 5   Entered and Authorized by:   Marisue Ivan  MD   Signed by:   Marisue Ivan  MD on 08/02/2009   Method used:   Electronically to        [facility]  [address] # [id number]* (retail)       [address]       [place], [place]  [address]       Ph: [phone] or [phone]       Fax: [phone]   RxID:   [id number]

## 2010-06-19 NOTE — Consult Note (Signed)
[facility]   Imported By: Clydell Hakim 04/07/2009 15:10:01  _____________________________________________________________________  External Attachment:    Type:   Image     Comment:   External Document  Appended Document: ACE-I intolerance Reviewed...adverse rxn to ACE-I   Clinical Lists Changes  Allergies: Added new allergy or adverse reaction of ACE INHIBITORS Observations: Added new observation of NKA: F (04/07/2009 16:39)      Appended Document: Remove lisinopril    Clinical Lists Changes  Medications: Changed medication from LISINOPRIL-HYDROCHLOROTHIAZIDE 20-25 MG TABS (LISINOPRIL-HYDROCHLOROTHIAZIDE) 1 tab by mouth daily to HYDROCHLOROTHIAZIDE 25 MG  TABS (HYDROCHLOROTHIAZIDE) Take 1 tab by mouth every morning - Signed Rx of HYDROCHLOROTHIAZIDE 25 MG  TABS (HYDROCHLOROTHIAZIDE) Take 1 tab by mouth every morning;  #0 x 0;  Signed;  Entered by: Marisue Ivan  MD;  Authorized by: Marisue Ivan  MD;  Method used: Historical    Prescriptions: HYDROCHLOROTHIAZIDE 25 MG  TABS (HYDROCHLOROTHIAZIDE) Take 1 tab by mouth every morning  #0 x 0   Entered and Authorized by:   Marisue Ivan  MD   Signed by:   Marisue Ivan  MD on 04/07/2009   Method used:   Historical   RxID:   [id number]

## 2010-06-19 NOTE — Assessment & Plan Note (Signed)
Summary: cough/[facility]   Vital Signs:  Patient profile:   55 year old female Height:      64.75 inches Weight:      215.19 pounds BMI:     36.22 O2 Sat:      96 % on Room air Temp:     98.4 degrees F oral Pulse rate:   74 / minute BP sitting:   166 / 105  (right arm) Cuff size:   large  Vitals Entered By: Jimmy Footman, CMA (March 29, 2010 10:49 AM)  O2 Flow:  Room air CC: congestion x2 weeks, wet cough Is Patient Diabetic? No   Primary Care Provider:  Renold Don MD  CC:  congestion x2 weeks and wet cough.  History of Present Illness: 55 yo here for work in appt for congestion x 2 weeks  cough, phlegm, clear rhinorhea.  No dyspnea, fever,headache,  itchy eyes, second sickening, cp , n/v/d, sore throat, or ear pain.  Has been usunig mucinex and albuterol 2-3 times per day.  States she has been compliant with all asthma meds- compliance due to cost has been an issue in the past.  never smoked, history of asthma, no history of uri/bonchitis in the past year  Habits & Providers  Alcohol-Tobacco-Diet     Tobacco Status: quit   Allergies: 1)  ! Penicillin 2)  Ace Inhibitors PMH-FH-SH reviewed for relevance  Review of Systems      See HPI  Physical Exam  General:  Vital signs reviewed as well as recheck Well-developed, well-nourished patient in NAD.  Awake, cooperative.  Eyes:  No corneal or conjunctival inflammation noted. EOMI. Perrla.  Ears:  External ear exam shows no significant lesions or deformities.  Otoscopic examination reveals clear canals, tympanic membranes are intact bilaterally without bulging, retraction, inflammation or discharge. Hearing is grossly normal bilaterally. Nose:  External nasal examination shows no deformity or inflammation. Nasal mucosa are pink and moist without lesions or exudates. Mouth:  oral mucosa moist Lungs:  Normal respiratory effort, chest expands symmetrically. Lungs are clear to auscultation, no crackles or wheezes. Heart:   Normal rate and regular rhythm. S1 and S2 normal without gallop, murmur, click, rub or other extra sounds. Abdomen:  soft/nontender/nondistended.  No organomegaly, bowel sounds present.  Extremities:  NO LE edema   Impression & Recommendations:  Problem # 1:  REACTIVE AIRWAY DISEASE (ICD-493.90)  Likely asthma exacerbation due to viral uri.  Even if acute bronchitis, likely viral in origin.  Normal lung exam and HEENT today.   WIll give 5 days prednisone, patient given red flags for return for recheck sooner, otherwise follow-up with PCP for routine health maintenance   Her updated medication list for this problem includes:    Singulair 10 Mg Tabs (Montelukast sodium) .Marland Kitchen... 1 tab by mouth daily for allergies    Ventolin Hfa 108 (90 Base) Mcg/act Aers (Albuterol sulfate) .Marland Kitchen... 2 puffs every 4hours as needed for wheezing    Qvar 80 Mcg/act Aers (Beclomethasone dipropionate) .Marland Kitchen... 2 puffs daily    Prednisone 20 Mg Tabs (Prednisone) .Marland Kitchen... 2 tabs daily x 5 days  Orders: [facility]- Est Level  3 ([address])  Complete Medication List: 1)  Hydrochlorothiazide 25 Mg Tabs (Hydrochlorothiazide) .... Take 1 tab by mouth every morning 2)  Synthroid 25 Mcg Tabs (Levothyroxine sodium) .... Take 1 tablet by mouth once a day 3)  Metoprolol Succinate 100 Mg Xr24h-tab (Metoprolol succinate) .Marland Kitchen.. 1 tablet by mouth daily for high blood pressure 4)  Adult Aspirin Ec  Low Strength 81 Mg Tbec (Aspirin) .... One tab by mouth qday 5)  Fluticasone Propionate 50 Mcg/act Susp (Fluticasone propionate) .... 2 sprays each nostril daily 6)  Nexium 40 Mg Cpdr (Esomeprazole magnesium) .Marland Kitchen.. 1 tab by mouth once daily as needed for reflux 7)  Zyrtec Allergy 10 Mg Tabs (Cetirizine hcl) .... One tablet by mouth daily 8)  Singulair 10 Mg Tabs (Montelukast sodium) .Marland Kitchen.. 1 tab by mouth daily for allergies 9)  Ventolin Hfa 108 (90 Base) Mcg/act Aers (Albuterol sulfate) .... 2 puffs every 4hours as needed for wheezing 10)  Qvar 80 Mcg/act  Aers (Beclomethasone dipropionate) .... 2 puffs daily 11)  Ranitidine Hcl 150 Mg Caps (Ranitidine hcl) .... Take 1 by mouth two times a day 12)  Prednisone 20 Mg Tabs (Prednisone) .... 2 tabs daily x 5 days  Patient Instructions: 1)  i think you have worsening of your asthma due to viral illness.   2)  We will treat with 5 days of prednisone 3)  follow-up if not improvement 4)  It would not surprise me if you continue to have some dry cough for several weeks after you are better from this. Prescriptions: PREDNISONE 20 MG TABS (PREDNISONE) 2 tabs daily x 5 days  #10 x 0   Entered and Authorized by:   Delbert Harness MD   Signed by:   Delbert Harness MD on 03/29/2010   Method used:   Electronically to        [facility]  [address] # [id number]* (retail)       [address]       [place], [place]  [address]       Ph: [phone] or [phone]       Fax: [phone]   RxID:   [id number] PREDNISONE 20 MG TABS (PREDNISONE) 1 tabs daily x 5 days  #10 x 0   Entered and Authorized by:   Delbert Harness MD   Signed by:   Delbert Harness MD on 03/29/2010   Method used:   Electronically to        [facility]  [address] [id number]* (retail)       [address]       [place], [place]  [address]       Ph: [phone] or [phone]       Fax: [phone]   RxID:   [id number]    Orders Added: 1)  [facility]- Est Level  3 [[id number]]

## 2010-06-19 NOTE — Progress Notes (Signed)
Summary: Referral  Phone Note Call from Patient Call back at Home Phone [phone]   Summary of Call: would like a referral to ENT for her throat still bothering her, has seen MD for this issue Initial call taken by: Haydee Salter,  February 23, 2008 10:08 AM  Follow-up for Phone Call        Please put order in if you are ok with her going to specialist Follow-up by: [facility] CMA,  February 23, 2008 11:52 AM  Additional Follow-up for Phone Call Additional follow up Details #1::        That's fine - she has uncontrolled GERD which I think is causing this but if she wants a second opinion I'm ok with that.  See order Additional Follow-up by: Norton Blizzard MD,  February 23, 2008 11:55 AM  New Problems: THROAT PAIN (ICD-784.1)   Additional Follow-up for Phone Call Additional follow up Details #2::    Appt made.  See order Follow-up by: [facility] CMA,  February 23, 2008 2:47 PM  New Problems: THROAT PAIN (ICD-784.1)   Appended Document: Referral pt is calling back to further discuss ENT appt, can be reached at the above number.

## 2010-06-19 NOTE — Letter (Signed)
Summary: Generic Letter  [facility]  [address]   [place], [place] [address]   Phone: [phone]  Fax:     08/28/2006  [address] [place], [place]  [address]  Dear Ms. Dhingra,  Your thyroid test is back to normal.  We need to recheck it again in about 2 months, just to make sure you are not over-corrected.  We can talk about it the next time you come in.  I hope you are doing well.   Sincerely,  Crawford Givens MD [facility]  Appended Document: Generic Letter letter sent  Admin Team

## 2010-06-19 NOTE — Progress Notes (Signed)
Summary: APPOINTMENT  Phone Note Call from Patient Call back at Home Phone [phone]   Reason for Call: Talk to Doctor Summary of Call: PT STS SHE WAS TOLD TO COME BACK IN 6 WEEKS TO HAVE HER THYROID CHECKED BUT SHE WANTS TO KNOW IF SHE JUST NEEDS TO HAVE LAB WORK DONE OR DOES SHE NEED AN APPT TO SEE HER DOCTOR Initial call taken by: ERIN LEVAN,  July 28, 2006 3:56 PM  Follow-up for Phone Call        Call Pt- she needs appointment and lab draw in 6 weeks. Follow-up by: Crawford Givens MD,  July 30, 2006 1:01 PM  Additional Follow-up for Phone Call Additional follow up Details #1::        left message for her to call back & make appt Additional Follow-up by: Golden Circle RN,  July 30, 2006 1:42 PM

## 2010-06-19 NOTE — Progress Notes (Signed)
Summary: f/u tsh and ov  ---- Converted from flag ---- ---- 07/30/2006 1:07 PM, Crawford Givens MD wrote: see document routed to red team re: patient coming in for appointment in 6 wks.  (patient needs appointment and lab draw in 6 wks). please call patient. ------------------------------ pt notified . appointment scheduled 08/27/06 to recheck lab,TSH. Marland Kitchenstarted on thyroid med 07/01/06 and scheduled  8 weeks from that date. appointment scheduled with Dr.Duncan 09/02/06. also pt has [facility]  and he will be requesting a change in metoprolol rx to a med they can provide. [facility] will be faxing a request--- Theresia Lo RN  Appended Document: f/u tsh and ov Toprol XL (metoprolol succinate) (Tablet Sustained Release 24 hr 50 mg) 1 tablet once a day Disp. 90 Rfl #3 (last: 07/31/2006)  rx sent via dr [facility].

## 2010-06-19 NOTE — Progress Notes (Signed)
Summary: Rx Prob  Phone Note Call from Patient Call back at Home Phone [phone]   Caller: Patient Summary of Call: Pt's nexium rx was not filled correctly it had her taking just one a day, when she usually takes it 2 xs a day.  Pt uses [facility] on [address] Initial call taken by: Clydell Hakim,  June 28, 2009 1:50 PM  Follow-up for Phone Call        will need to verify with MD as it has not been sent in thru this office it appears from med list. Follow-up by: Theresia Lo RN,  June 28, 2009 2:23 PM  Additional Follow-up for Phone Call Additional follow up Details #1::        pt's husband called and said she needed the pills today not tomorrow. Additional Follow-up by: Clydell Hakim,  June 29, 2009 2:52 PM    Prescriptions: NEXIUM 40 MG CPDR (ESOMEPRAZOLE MAGNESIUM) 1 tab by mouth two times a day  #60 x 0   Entered and Authorized by:   Tawanna Cooler Sherie Dobrowolski MD   Signed by:   Tawanna Cooler Tomika Eckles MD on 06/29/2009   Method used:   Electronically to        [id number] (retail)       [address]       [place], [place]  [address]       Ph: [phone] or [phone]       Fax: [phone]   RxID:   [id number]   Appended Document: Rx Prob patient notified.

## 2010-06-19 NOTE — Letter (Signed)
Summary: Results Follow Up Letter  [facility]  [address]   [place], [place] [address]   Phone: [phone]  Fax: [phone]    03/29/2007 MRN: [id number]  [address] [place], [place]  [address]  Dear Ms. Herrle,   The following are the results of your recent test(s):   _________________________________________________________ Cholesterol LDL(Bad cholesterol): 127   Your goal is less than:130         HDL (Good cholesterol):54     Your goal is more than:40 _________________________________________________________ Other Tests: sugar was borderline normal .  I would recommnend eating less sugar and trying to lose some weight. Otherwise, your labs looked good   _________________________________________________________  Please call [phone] for an appointment Or _________________________________________________________ _________________________________________________________ _________________________________________________________  Sincerely,  Altamese Cabal MD [facility]          Appended Document: Results Follow Up Letter patient letter mailed

## 2010-06-19 NOTE — Miscellaneous (Signed)
Clinical Lists Changes  Medications: Rx of HYDROCHLOROTHIAZIDE 25 MG  TABS (HYDROCHLOROTHIAZIDE) Take 1 tab by mouth every morning;  #90 x 1;  Signed;  Entered by: Renold Don MD;  Authorized by: Renold Don MD;  Method used: Electronically to [facility]  [address] # [id number]*, [address] [place], [place], [place]  [address], Ph: [phone] or [phone], Fax: [phone] Rx of METOPROLOL SUCCINATE 100 MG XR24H-TAB (METOPROLOL SUCCINATE) 1 tablet by mouth daily for high blood pressure;  #90 x 1;  Signed;  Entered by: Renold Don MD;  Authorized by: Renold Don MD;  Method used: Electronically to [facility]  [address] # [id number]*, [address] [place], [place], [place]  [address], Ph: [phone] or [phone], Fax: [phone] Rx of FLUTICASONE PROPIONATE 50 MCG/ACT SUSP (FLUTICASONE PROPIONATE) 2 sprays each nostril daily;  #1 x 5;  Signed;  Entered by: Renold Don MD;  Authorized by: Renold Don MD;  Method used: Electronically to [facility]  [address] # [id number]*, [address] [place], [place], [place]  [address], Ph: [phone] or [phone], Fax: [phone] Rx of NEXIUM 40 MG CPDR (ESOMEPRAZOLE MAGNESIUM) 1 tab by mouth once daily as needed for reflux;  #30 x 11;  Signed;  Entered by: Renold Don MD;  Authorized by: Renold Don MD;  Method used: Electronically to [facility]  [address] # [id number]*, [address] [place], [place], [place]  [address], Ph: [phone] or [phone], Fax: [phone] Rx of ZYRTEC ALLERGY 10 MG TABS (CETIRIZINE HCL) one tablet by mouth daily;  #90 x 1;  Signed;  Entered by: Renold Don MD;  Authorized by: Renold Don MD;  Method used: Electronically to [facility]  [address] # [id number]*, [address] [place], [place], [place]  [address], Ph: [phone] or [phone], Fax: [phone]    Prescriptions: ZYRTEC ALLERGY 10 MG TABS (CETIRIZINE HCL) one tablet by mouth daily  #90 x 1   Entered and Authorized by:   Renold Don MD   Signed  by:   Renold Don MD on 11/28/2009   Method used:   Electronically to        [facility]  [address] # [id number]* (retail)       [address]       [place], [place]  [address]       Ph: [phone] or [phone]       Fax: [phone]   RxID:   [id number] NEXIUM 40 MG CPDR (ESOMEPRAZOLE MAGNESIUM) 1 tab by mouth once daily as needed for reflux  #30 x 11   Entered and Authorized by:   Renold Don MD   Signed by:   Renold Don MD on 11/28/2009   Method used:   Electronically to        [facility]  [address] # [id number]* (retail)       [address]       [place], [place]  [address]       Ph: [phone] or [phone]       Fax: [phone]   RxID:   [id number] FLUTICASONE PROPIONATE 50 MCG/ACT SUSP (FLUTICASONE PROPIONATE) 2 sprays each nostril daily  #1 x 5   Entered and Authorized by:   Renold Don MD   Signed by:   Renold Don MD on 11/28/2009   Method used:   Electronically to        [facility]  [address] #  [id number]* (retail)       [address]       [place], [place]  [address]       Ph: [phone] or [phone]       Fax: [phone]   RxID:   [id number] METOPROLOL SUCCINATE 100 MG XR24H-TAB (METOPROLOL SUCCINATE) 1 tablet by mouth daily for high blood pressure  #90 x 1   Entered and Authorized by:   Renold Don MD   Signed by:   Renold Don MD on 11/28/2009   Method used:   Electronically to        [facility]  [address] # [id number]* (retail)       [address]       [place], [place]  [address]       Ph: [phone] or [phone]       Fax: [phone]   RxID:   [id number] HYDROCHLOROTHIAZIDE 25 MG  TABS (HYDROCHLOROTHIAZIDE) Take 1 tab by mouth every morning  #90 x 1   Entered and Authorized by:   Renold Don MD   Signed by:   Renold Don MD on 11/28/2009   Method used:   Electronically to        [facility]  [address] # [id number]* (retail)       [address]        [place], [place]  [address]       Ph: [phone] or [phone]       Fax: [phone]   RxID:   [id number]

## 2010-06-19 NOTE — Progress Notes (Signed)
Summary: Return call  Phone Note Call from Patient Call back at Home Phone [phone]   Summary of Call: Pt is returning a call to Dr. Pearletha Forge, can be reached at the above number for the rest of the day. Initial call taken by: Haydee Salter,  January 18, 2008 3:03 PM  Follow-up for Phone Call        Called patient and left message on voicemail to return call.  Best time for me today will probably be around 4:30-5:30 depending on how clinic goes - advised her of this on voicemail and to let us know if she's available around then. Follow-up by: Norton Blizzard MD,  January 21, 2008 12:28 PM  Additional Follow-up for Phone Call Additional follow up Details #1::        returning call and states she will behome between those hours. Additional Follow-up by: Haydee Salter,  January 21, 2008 1:57 PM    Additional Follow-up for Phone Call Additional follow up Details #2::    Spoke with patient, read result of cxr and explained it to her.  Likely scar or atelectasis but I am typically aggressive with these in performing a CT to further evaluate.  We will discuss this in more depth at her appointment next week. Follow-up by: Norton Blizzard MD,  January 21, 2008 5:23 PM

## 2010-06-19 NOTE — Assessment & Plan Note (Signed)
Summary: f/u meds/tlb   Vital Signs:  Patient profile:   55 year old female Weight:      220 pounds BMI:     37.03 Temp:     98.9 degrees F oral Pulse rate:   72 / minute BP sitting:   158 / 70  (left arm)  Vitals Entered By: Jimmy Footman, CMA (November 28, 2009 8:38 AM) CC: f/u meds Is Patient Diabetic? No Pain Assessment Patient in pain? no        Serial Vital Signs/Assessments:  Time      Position  BP       Pulse  Resp  Temp     By                     140/98                         Jimmy Footman, CMA   Primary Care Provider:  Renold Don MD  CC:  f/u meds.  History of Present Illness: HTN: No recent changes in medication, patient tolerating them well.  States she is taking them regularly. Checking it regularly and states that it typically runs in the 130s.  Was not well controlled at last visit.  No dizziness, HA, CP, palpitations, or swelling.  GERD: Has stopped the Ranitidine and moved Nexium to as needed basis.  However this resulted in almost immediate return of symptoms.  Now currently regularly taking Nexium as well as Ranitidine on as needed basis.  Describes pain as burning in stomach, present on most days, not necessarily related to food intake.  Allergies/Reactive Airway dz: Has occasional wheezing that is improved with the albuterol inhaler.  Wheezing worse with increase in hot weather.  Uses Albuterol only as needed, once or twice daily but not using everyday.  Also using QVAR everyday.  Allergies are well controlled with the nasal steroid, zyrtec, and singulair.  Habits & Providers  Alcohol-Tobacco-Diet     Tobacco Status: quit  Current Problems (verified): 1)  Reactive Airway Disease  (ICD-493.90) 2)  Health Maintenance Exam  (ICD-V70.0) 3)  Hypertension, Benign Systemic  (ICD-401.1) 4)  Alcohol Abuse, Unspecified  (ICD-305.00) 5)  Gastroesophageal Reflux, No Esophagitis  (ICD-530.81) 6)  Allergic Rhinitis  (ICD-477.9) 7)  Lactose Intolerance   (ICD-271.3) 8)  Obesity, Nos  (ICD-278.00) 9)  Menopausal Syndrome  (ICD-627.2) 10)  Hypothyroidism, Unspecified  (ICD-244.9)  Current Medications (verified): 1)  Hydrochlorothiazide 25 Mg  Tabs (Hydrochlorothiazide) .... Take 1 Tab By Mouth Every Morning 2)  Synthroid 25 Mcg Tabs (Levothyroxine Sodium) .... Take 1 Tablet By Mouth Once A Day 3)  Metoprolol Succinate 100 Mg Xr24h-Tab (Metoprolol Succinate) .Marland Kitchen.. 1 Tablet By Mouth Daily For High Blood Pressure 4)  Adult Aspirin Ec Low Strength 81 Mg  Tbec (Aspirin) .... One Tab By Mouth Qday 5)  Fluticasone Propionate 50 Mcg/act Susp (Fluticasone Propionate) .... 2 Sprays Each Nostril Daily 6)  Nexium 40 Mg Cpdr (Esomeprazole Magnesium) .Marland Kitchen.. 1 Tab By Mouth Once Daily As Needed For Reflux 7)  Zyrtec Allergy 10 Mg Tabs (Cetirizine Hcl) .... One Tablet By Mouth Daily 8)  Singulair 10 Mg Tabs (Montelukast Sodium) .Marland Kitchen.. 1 Tab By Mouth Daily For Allergies 9)  Ventolin Hfa 108 (90 Base) Mcg/act Aers (Albuterol Sulfate) .... 2 Puffs Every 4hours As Needed For Wheezing 10)  Qvar 80 Mcg/act Aers (Beclomethasone Dipropionate) .... 2 Puffs Daily  Allergies: 1)  ! Penicillin 2)  Ace  Inhibitors  Past History:  Past medical, surgical, family and social histories (including risk factors) reviewed, and no changes noted (except as noted below).  Past Medical History: Reviewed history from 10/18/2008 and no changes required. HTN High risk alcohol abuse along with husband GERD Rosacea C5-6 disk disease  Multiple pyelonephritis infections as child Previous smoker Allergic rhinitis  Past Surgical History: Reviewed history from 10/18/2008 and no changes required. Appendectomy Lipoma removal right axilla as child Tonsillectomy/Adenoidectomy Transverse C/S w/BTL  PFTs-Ratio 94% - 10/12/2004  Family History: Reviewed history from 07/17/2006 and no changes required. Father lung cancer, CAD (mult MI) first age 11, HTN, high cholesterol.  Mom had  emphysema, d/o ALS.  Mom had mastectomy for `calcifications`., Mom -emphysema, Ca deposit in br s/p mastectomy  Social History: Reviewed history from 10/18/2008 and no changes required. married with 2 children, 5 grandchildren.  No tobacco.  5-6 beers/night. Quit marijuana 10 weeks ago after 40 year history due to finances.  No cocaine.  On disablity.  No DUIsSmoking Status:  quit  Physical Exam  General:  Vital signs reviewed as well as recheck Well-developed, well-nourished patient in NAD.  Awake, cooperative.  Eyes:  No corneal or conjunctival inflammation noted. EOMI. Perrla. Funduscopic exam benign, without hemorrhages, exudates or papilledema. Vision grossly normal. Ears:  External ear exam shows no significant lesions or deformities.  Otoscopic examination reveals clear canals, tympanic membranes are intact bilaterally without bulging, retraction, inflammation or discharge. Hearing is grossly normal bilaterally. Mouth:  oral mucosa moist Lungs:  Normal respiratory effort, chest expands symmetrically. Lungs are clear to auscultation, no crackles or wheezes. Heart:  Normal rate and regular rhythm. S1 and S2 normal without gallop, murmur, click, rub or other extra sounds. Abdomen:  soft/nontender/nondistended.  No organomegaly, bowel sounds present.  Pulses:  distal pulses present bilaterally  Extremities:  No clubbing, cyanosis, edema, or deformity noted with normal full range of motion of all joints.     Impression & Recommendations:  Problem # 1:  GASTROESOPHAGEAL REFLUX, NO ESOPHAGITIS (ICD-530.81) Assessment Deteriorated As noted above, now taking Nexium daily, Ranitidine on as needed basis.  Denies smoking.  2 cups of coffee daily in AM.  Also chronic EtOH use, which probably exacerbates condition.  Discussed this with patient, see below.  Continue current treatment plan, discussed red flags or need to call clinic/go to ED. Her updated medication list for this problem includes:     Nexium 40 Mg Cpdr (Esomeprazole magnesium) .Marland Kitchen... 1 tab by mouth once daily as needed for reflux  Orders: [facility]- Est Level  3 (21308)  Problem # 2:  REACTIVE AIRWAY DISEASE (ICD-493.90) Assessment: Deteriorated Worse with heat.  No change in medication today.  Even though worsening, still controlled with Albuterol.   Her updated medication list for this problem includes:    Singulair 10 Mg Tabs (Montelukast sodium) .Marland Kitchen... 1 tab by mouth daily for allergies    Ventolin Hfa 108 (90 Base) Mcg/act Aers (Albuterol sulfate) .Marland Kitchen... 2 puffs every 4hours as needed for wheezing    Qvar 80 Mcg/act Aers (Beclomethasone dipropionate) .Marland Kitchen... 2 puffs daily  Orders: [facility]- Est Level  3 (65784)  Problem # 3:  HYPERTENSION, BENIGN SYSTEMIC (ICD-401.1) Assessment: Unchanged Right at goal of 140 systolic.  No change to medications this visit.  Have not obtained bloodwork on patient for almost a year, will check today.   Her updated medication list for this problem includes:    Hydrochlorothiazide 25 Mg Tabs (Hydrochlorothiazide) .Marland Kitchen... Take 1 tab by mouth  every morning    Metoprolol Succinate 100 Mg Xr24h-tab (Metoprolol succinate) .Marland Kitchen... 1 tablet by mouth daily for high blood pressure  Orders: Comp Met-[facility] (84132-44010) CBC-[facility] (27253) TSH-[facility] (66440-34742) [facility]- Est Level  3 (59563)  Problem # 4:  ALCOHOL ABUSE, UNSPECIFIED (ICD-305.00) Patient pre-contemplative regarding stopping or decreasing alcohol use.  CAGE negative.  Will check CMET today for liver function.  Discussed how this is exacerbating her GERD.  Patient uses alcohol to relax at end of day.  Had used marijuana in pas as wel, but too expensive now.    Complete Medication List: 1)  Hydrochlorothiazide 25 Mg Tabs (Hydrochlorothiazide) .... Take 1 tab by mouth every morning 2)  Synthroid 25 Mcg Tabs (Levothyroxine sodium) .... Take 1 tablet by mouth once a day 3)  Metoprolol Succinate 100 Mg Xr24h-tab (Metoprolol succinate) .Marland Kitchen.. 1 tablet by mouth  daily for high blood pressure 4)  Adult Aspirin Ec Low Strength 81 Mg Tbec (Aspirin) .... One tab by mouth qday 5)  Fluticasone Propionate 50 Mcg/act Susp (Fluticasone propionate) .... 2 sprays each nostril daily 6)  Nexium 40 Mg Cpdr (Esomeprazole magnesium) .Marland Kitchen.. 1 tab by mouth once daily as needed for reflux 7)  Zyrtec Allergy 10 Mg Tabs (Cetirizine hcl) .... One tablet by mouth daily 8)  Singulair 10 Mg Tabs (Montelukast sodium) .Marland Kitchen.. 1 tab by mouth daily for allergies 9)  Ventolin Hfa 108 (90 Base) Mcg/act Aers (Albuterol sulfate) .... 2 puffs every 4hours as needed for wheezing 10)  Qvar 80 Mcg/act Aers (Beclomethasone dipropionate) .... 2 puffs daily

## 2010-06-19 NOTE — Progress Notes (Signed)
Summary: phn msg  Phone Note Call from Patient Call back at Home Phone [phone]   Caller: Patient Summary of Call: said last time she was in her medication was changed and she was to call back and speak with Dr to say how she was doing. Initial call taken by: Clydell Hakim,  February 23, 2009 2:54 PM  Follow-up for Phone Call        pt states she has been taking zyrtec instead of allegra in the afternoon and this has helped her cough, but in the am she continues to cough. she wanted to know if she can take this twice daily? Follow-up by: Alphia Kava,  February 23, 2009 3:24 PM  Additional Follow-up for Phone Call Additional follow up Details #1::        Spoke to patient and she states that the evenings are good but still has symptoms during the day.  Overall she feels better.  She has seen an ENT multiple times and is on high doses of GERD meds.  I think it is appropriate to refer her to an allergist. Additional Follow-up by: Marisue Ivan  MD,  February 23, 2009 4:44 PM

## 2010-06-19 NOTE — Assessment & Plan Note (Signed)
Summary: f/u chronic issues   Vital Signs:  Patient profile:   55 year old female Height:      64.75 inches Weight:      217.2 pounds BMI:     36.56 Temp:     98.1 degrees F oral Pulse rate:   88 / minute BP sitting:   135 / 90  (left arm) Cuff size:   large  Vitals Entered By: Gladstone Pih (August 28, 2009 9:10 AM)  Serial Vital Signs/Assessments:  Time      Position  BP       Pulse  Resp  Temp     By 9:13 AM             136/92                         Gladstone Pih  Comments: 9:13 AM Re checked manually By: Gladstone Pih   CC: F/U HTN,Med check Is Patient Diabetic? No Pain Assessment Patient in pain? no        Primary Care Provider:  Marisue Ivan  MD  CC:  F/U HTN and Med check.  History of Present Illness: 55yo F here for f/u HTN,    HTN: No adverse effects from inc in metoprolol.  Cough resolved since stopping the lisinopril.  Checking it regularly and states that it typically runs in the 130s.  Was not well controlled at last visit.  No dizziness, HA, CP, palpitations, or swelling.  Obesity: States she has not been able to lose wt despite low fat diet and walking.  GERD: Denies any symptoms since cutting back the nexium to once a day.  Would like cut back on the ranitidine.  Allergies/Reactive Airway dz: States that she has been without the QVAR b/c it requires prior authorization.  Has occasional wheezing that is improved with the albuterol inhaler.  Allergies are well controlled with the nasal steroid, zyrtec, and singulair.  Habits & Providers  Alcohol-Tobacco-Diet     Tobacco Status: never  Current Medications (verified): 1)  Hydrochlorothiazide 25 Mg  Tabs (Hydrochlorothiazide) .... Take 1 Tab By Mouth Every Morning 2)  Synthroid 25 Mcg Tabs (Levothyroxine Sodium) .... Take 1 Tablet By Mouth Once A Day 3)  Metoprolol Succinate 100 Mg Xr24h-Tab (Metoprolol Succinate) .Marland Kitchen.. 1 Tablet By Mouth Daily For High Blood Pressure 4)  Adult Aspirin Ec Low  Strength 81 Mg  Tbec (Aspirin) .... One Tab By Mouth Qday 5)  Fluticasone Propionate 50 Mcg/act Susp (Fluticasone Propionate) .... 2 Sprays Each Nostril Daily 6)  Nexium 40 Mg Cpdr (Esomeprazole Magnesium) .Marland Kitchen.. 1 Tab By Mouth Once Daily As Needed For Reflux 7)  Zyrtec Allergy 10 Mg Tabs (Cetirizine Hcl) .... One Tablet By Mouth Daily 8)  Singulair 10 Mg Tabs (Montelukast Sodium) .Marland Kitchen.. 1 Tab By Mouth Daily For Allergies 9)  Proair Hfa 108 (90 Base) Mcg/act Aers (Albuterol Sulfate) .... 2 Puffs Q4h As Needed Wheezing 10)  Qvar 80 Mcg/act Aers (Beclomethasone Dipropionate) .... 2 Puffs Daily  Allergies (verified): 1)  Ace Inhibitors  Social History: Smoking Status:  never  Review of Systems       No dizziness, HA, CP, palpitations, or swelling.  Physical Exam  General:  VS Reviewed. Obese, well appearing, NAD.  Eyes:  no injected conjunctiva no tearing Lungs:  Normal respiratory effort, chest expands symmetrically. Lungs are clear to auscultation, no crackles or wheezes. Heart:  Normal rate and regular rhythm. S1 and S2 normal  without gallop, murmur, click, rub or other extra sounds. Extremities:  no edema   Impression & Recommendations:  Problem # 1:  HYPERTENSION, BENIGN SYSTEMIC (ICD-401.1) Assessment Improved  Close to goal (<140/90). No changes to regimen.  Tolerated Metoprolol well.  Cough resolved since stopping the lisinopril.    Her updated medication list for this problem includes:    Hydrochlorothiazide 25 Mg Tabs (Hydrochlorothiazide) .Marland Kitchen... Take 1 tab by mouth every morning    Metoprolol Succinate 100 Mg Xr24h-tab (Metoprolol succinate) .Marland Kitchen... 1 tablet by mouth daily for high blood pressure  Orders: [facility]- Est  Level 4 (99214)  Problem # 2:  GASTROESOPHAGEAL REFLUX, NO ESOPHAGITIS (ICD-530.81) Assessment: Improved  Asymptomatic. Will stop the ranitidine and place the nexium on as needed basis.  The following medications were removed from the medication list:     Ranitidine Hcl 150 Mg Tabs (Ranitidine hcl) .Marland Kitchen... 1 tab by mouth two times a day Her updated medication list for this problem includes:    Nexium 40 Mg Cpdr (Esomeprazole magnesium) .Marland Kitchen... 1 tab by mouth once daily as needed for reflux  Orders: [facility]- Est  Level 4 (16109)  Problem # 3:  OBESITY, NOS (ICD-278.00) Assessment: Deteriorated  Gained 10lbs since this time last year. Discuss change in exercise regimen.  Will start stationary bike with upward titration of time. We went over her diet and it seems that she is supposedly on less than 1200 calories a day.  Orders: [facility]- Est  Level 4 (60454)  Problem # 4:  ALLERGIC RHINITIS (ICD-477.9) Assessment: Unchanged No changes to regimen. Well controlled on current regimen.  Her updated medication list for this problem includes:    Fluticasone Propionate 50 Mcg/act Susp (Fluticasone propionate) .Marland Kitchen... 2 sprays each nostril daily    Zyrtec Allergy 10 Mg Tabs (Cetirizine hcl) ..... One tablet by mouth daily  Problem # 5:  REACTIVE AIRWAY DISEASE (ICD-493.90) Assessment: Deteriorated  Worsening wheezing since not being able to obtain QVAR. Advise to ask the pharmacy to send me the prior auth form to complete.  Her updated medication list for this problem includes:    Singulair 10 Mg Tabs (Montelukast sodium) .Marland Kitchen... 1 tab by mouth daily for allergies    Proair Hfa 108 (90 Base) Mcg/act Aers (Albuterol sulfate) .Marland Kitchen... 2 puffs q4h as needed wheezing    Qvar 80 Mcg/act Aers (Beclomethasone dipropionate) .Marland Kitchen... 2 puffs daily  Orders: [facility]- Est  Level 4 (09811)  Complete Medication List: 1)  Hydrochlorothiazide 25 Mg Tabs (Hydrochlorothiazide) .... Take 1 tab by mouth every morning 2)  Synthroid 25 Mcg Tabs (Levothyroxine sodium) .... Take 1 tablet by mouth once a day 3)  Metoprolol Succinate 100 Mg Xr24h-tab (Metoprolol succinate) .Marland Kitchen.. 1 tablet by mouth daily for high blood pressure 4)  Adult Aspirin Ec Low Strength 81 Mg Tbec (Aspirin) ....  One tab by mouth qday 5)  Fluticasone Propionate 50 Mcg/act Susp (Fluticasone propionate) .... 2 sprays each nostril daily 6)  Nexium 40 Mg Cpdr (Esomeprazole magnesium) .Marland Kitchen.. 1 tab by mouth once daily as needed for reflux 7)  Zyrtec Allergy 10 Mg Tabs (Cetirizine hcl) .... One tablet by mouth daily 8)  Singulair 10 Mg Tabs (Montelukast sodium) .Marland Kitchen.. 1 tab by mouth daily for allergies 9)  Proair Hfa 108 (90 Base) Mcg/act Aers (Albuterol sulfate) .... 2 puffs q4h as needed wheezing 10)  Qvar 80 Mcg/act Aers (Beclomethasone dipropionate) .... 2 puffs daily  Patient Instructions: 1)  Please schedule a follow-up appointment in 4 months .  2)  No changes to blood pressure medicine. 3)  Reflux: Change the Nexium to as needed basis and stop the ranitidine and let me know if symptoms return. 4)  Check on the QVAR and send me the necessary forms.    Prevention & Chronic Care Immunizations   Influenza vaccine: Not documented    Tetanus booster: 10/19/2003: Done.   Tetanus booster due: 10/18/2013    Pneumococcal vaccine: Not documented  Colorectal Screening   Hemoccult: Not documented   Hemoccult due: Not Indicated    Colonoscopy: diverticulosis  (11/28/2008)   Colonoscopy due: 11/29/2018  Other Screening   Pap smear:  Specimen Adequacy: Satisfactory for evaluation.   Interpretation/Result:Negative for intraepithelial Lesion or Malignancy.     (09/09/2007)   Pap smear action/deferral: Deferred-3 yr interval  (02/06/2009)   Pap smear due: 09/09/2010    Mammogram: normal  (10/30/2007)   Mammogram action/deferral: Deferred-2 yr interval  (02/06/2009)   Mammogram due: 10/29/2008   Smoking status: never  (08/28/2009)  Lipids   Total Cholesterol: 203  (07/14/2008)   LDL: 124  (07/14/2008)   LDL Direct: Not documented   HDL: 59  (07/14/2008)   Triglycerides: 102  (07/14/2008)  Hypertension   Last Blood Pressure: 135 / 90  (08/28/2009)   Serum creatinine: 0.91  (10/18/2008)   Serum  potassium 4.2  (10/18/2008)    Hypertension flowsheet reviewed?: Yes   Progress toward BP goal: At goal  Self-Management Support :   Personal Goals (by the next clinic visit) :      Personal blood pressure goal: 140/90  (02/06/2009)   Patient will work on the following items until the next clinic visit to reach self-care goals:     Medications and monitoring: take my medicines every day, check my blood pressure  (08/28/2009)     Eating: drink diet soda or water instead of juice or soda, eat more vegetables, use fresh or frozen vegetables, eat foods that are low in salt, eat baked foods instead of fried foods, eat fruit for snacks and desserts, limit or avoid alcohol  (08/28/2009)    Hypertension self-management support: BP self-monitoring log, Written self-care plan, Education handout  (08/28/2009)   Hypertension self-care plan printed.   Hypertension education handout printed

## 2010-06-19 NOTE — Progress Notes (Signed)
Summary: Rx  Phone Note Call from Patient Call back at [facility] Phone [phone]   Summary of Call: pt states wrong dosage of hctz was called in this morning, would like to speak with someone about it. Initial call taken by: Haydee Salter,  July 14, 2008 1:38 PM  Follow-up for Phone Call        spoke with patient and she states Dr. Mayford Knife of [facility] increased her in the past to Lisinopril / HCTZ 20/25 mg . this is what she has been on. today when she picked up rx that was sent in by Dr. Pearletha Forge she realized the RX is for the 10 / 12.5 mg. will send message to MD to please advise. Follow-up by: Theresia Lo RN,  July 14, 2008 1:50 PM  Additional Follow-up for Phone Call Additional follow up Details #1::        Sent iincreased dose to pharmacy. Additional Follow-up by: Norton Blizzard MD,  July 14, 2008 1:58 PM    Additional Follow-up for Phone Call Additional follow up Details #2::    Patient notified. Follow-up by: Modesta Messing LPN,  July 14, 2008 2:31 PM  New/Updated Medications: LISINOPRIL-HYDROCHLOROTHIAZIDE 20-25 MG TABS (LISINOPRIL-HYDROCHLOROTHIAZIDE) 1 tab by mouth daily   Prescriptions: LISINOPRIL-HYDROCHLOROTHIAZIDE 20-25 MG TABS (LISINOPRIL-HYDROCHLOROTHIAZIDE) 1 tab by mouth daily  #30 x 2   Entered and Authorized by:   Norton Blizzard MD   Signed by:   Norton Blizzard MD on 07/14/2008   Method used:   Electronically to        [id number] (retail)       [address]       [place], [place]  [address]       Ph: [phone] or [phone]       Fax: [phone]   RxID:   [id number]   Appended Document: Rx patient notified.

## 2010-06-21 NOTE — Progress Notes (Signed)
  Phone Note Refill Request Call back at [phone]   [name] would like for a refill for her Flovent called to pharmacy.  Was not prescribed by provider,but do not want to have to see allergist to have meds renewed.  If not possible please let her know.  Initial call taken by: Abundio Miu,  May 22, 2010 2:22 PM  Follow-up for Phone Call        Called and left message for patient.  We have QVar as her medicine instead of Flovent.  If she would like it changed we would need to know current dose of Flovent.  It would also be nice to know when and why her medicine was changed from QVar to Flovent.   Follow-up by: Renold Don MD,  May 23, 2010 12:07 PM  Additional Follow-up for Phone Call Additional follow up Details #1::        pt is returning call, can be reached at [phone] Additional Follow-up by: Knox Royalty,  May 23, 2010 2:11 PM    Additional Follow-up for Phone Call Additional follow up Details #2::    Pt is on flovent hfa . She states that it has been changed for 3-4 moths now. She doesn't know why it was changed Follow-up by: Jimmy Footman, CMA,  May 23, 2010 4:42 PM  Additional Follow-up for Phone Call Additional follow up Details #3:: Details for Additional Follow-up Action Taken: Thanks for all the help, refilled prescription.   Additional Follow-up by: Renold Don MD,  May 23, 2010 9:12 PM  New/Updated Medications: FLOVENT HFA 110 MCG/ACT AERO (FLUTICASONE PROPIONATE  HFA) Inhale 2 puffs in AM and 2 puffs in PM Prescriptions: FLOVENT HFA 110 MCG/ACT AERO (FLUTICASONE PROPIONATE  HFA) Inhale 2 puffs in AM and 2 puffs in PM  #1 x 2   Entered and Authorized by:   Renold Don MD   Signed by:   Renold Don MD on 05/23/2010   Method used:   Electronically to        [facility]  [address] [id number]* (retail)       [address]       [place], [place]  [address]       Ph: [phone] or [phone]       Fax: [phone]   RxID:    [id number]   Appended Document:  called and informed pt

## 2010-06-21 NOTE — Assessment & Plan Note (Signed)
Summary: f/u med refills/bmc   Vital Signs:  Patient profile:   55 year old female Height:      64.75 inches Weight:      214.6 pounds BMI:     36.12 Temp:     97.6 degrees F oral Pulse rate:   65 / minute BP sitting:   159 / 96  (left arm) Cuff size:   regular  Vitals Entered By: Jimmy Footman, CMA (June 11, 2010 8:33 AM)  Serial Vital Signs/Assessments:  Time      Position  BP       Pulse  Resp  Temp     By                     138/92                         Renold Don MD  CC: med refill Is Patient Diabetic? No Pain Assessment Patient in pain? no        Primary Care Provider:  Renold Don MD  CC:  med refill.  History of Present Illness: HTN: No adverse effects from meds.  Checking it regularly and states that it typically runs in the 130s.  Was not well controlled at last visit.  No dizziness, HA, CP, palpitations, or swelling.  GERD: Increasingly more symptoms since last visit.  No dysphagia or odynophagia.  Simply what she describes as "indigestion" on most days.  Would like to increase Nexium to twice daily which has helped her in short term before.    Allergies/Reactive Airway dz: States that she has been without the QVAR b/c it requires prior authorization.  Has occasional wheezing that is improved with the albuterol inhaler.  Allergies are well controlled with the nasal steroid, zyrtec, and singulair.  Prevention:  Scheduled for colonoscopy in 9 years, had previous one last year.  Normal per patient, at [facility].    Habits & Providers  Alcohol-Tobacco-Diet     Tobacco Status: quit  Current Problems (verified): 1)  Bronchitis, Acute  (ICD-466.0) 2)  Asthma, Persistent  (ICD-493.90) 3)  Health Maintenance Exam  (ICD-V70.0) 4)  Hypertension, Benign Systemic  (ICD-401.1) 5)  Alcohol Abuse, Unspecified  (ICD-305.00) 6)  Gastroesophageal Reflux, No Esophagitis  (ICD-530.81) 7)  Allergic Rhinitis  (ICD-477.9) 8)  Lactose Intolerance  (ICD-271.3) 9)  Obesity,  Nos  (ICD-278.00) 10)  Menopausal Syndrome  (ICD-627.2) 11)  Hypothyroidism, Unspecified  (ICD-244.9)  Current Medications (verified): 1)  Hydrochlorothiazide 25 Mg  Tabs (Hydrochlorothiazide) .... Take 1 Tab By Mouth Every Morning 2)  Synthroid 25 Mcg Tabs (Levothyroxine Sodium) .... Take 1 Tablet By Mouth Once A Day 3)  Metoprolol Succinate 100 Mg Xr24h-Tab (Metoprolol Succinate) .Marland Kitchen.. 1 Tablet By Mouth Daily For High Blood Pressure 4)  Adult Aspirin Ec Low Strength 81 Mg  Tbec (Aspirin) .... One Tab By Mouth Qday 5)  Fluticasone Propionate 50 Mcg/act Susp (Fluticasone Propionate) .... 2 Sprays Each Nostril Daily 6)  Nexium 40 Mg Cpdr (Esomeprazole Magnesium) .Marland Kitchen.. 1 Tab By Mouth Once Daily As Needed For Reflux 7)  Ventolin Hfa 108 (90 Base) Mcg/act Aers (Albuterol Sulfate) .... 2 Puffs Every 4hours As Needed For Wheezing 8)  Flovent Hfa 110 Mcg/act Aero (Fluticasone Propionate  Hfa) .... Inhale 2 Puffs in Am and 2 Puffs in Pm 9)  Ranitidine Hcl 150 Mg Caps (Ranitidine Hcl) .... Take 1 By Mouth Two Times A Day 10)  Fexofenadine Hcl 180  Mg Tabs (Fexofenadine Hcl) .... Take 1 Daily For Allergies  Allergies (verified): 1)  ! Penicillin 2)  Ace Inhibitors  Past History:  Past medical, surgical, family and social histories (including risk factors) reviewed, and no changes noted (except as noted below).  Past Medical History: Reviewed history from 10/18/2008 and no changes required. HTN High risk alcohol abuse along with husband GERD Rosacea C5-6 disk disease  Multiple pyelonephritis infections as child Previous smoker Allergic rhinitis  Past Surgical History: Reviewed history from 10/18/2008 and no changes required. Appendectomy Lipoma removal right axilla as child Tonsillectomy/Adenoidectomy Transverse C/S w/BTL  PFTs-Ratio 94% - 10/12/2004  Family History: Reviewed history from 07/17/2006 and no changes required. Father lung cancer, CAD (mult MI) first age 10, HTN, high  cholesterol.  Mom had emphysema, d/o ALS.  Mom had mastectomy for `calcifications`., Mom -emphysema, Ca deposit in br s/p mastectomy  Social History: Reviewed history from 11/28/2009 and no changes required. married with 2 children, 5 grandchildren.  No tobacco.  5-6 beers/night. Quit marijuana 10 weeks ago after 40 year history due to finances.  No cocaine.  On disablity.  No DUIs  Review of Systems       ROS:  no headaches, pre-syncopal or syncopal episodes, chest pain, palpitations, shortness of breath or dyspnea, abdominal pain, diarrhea or constipation, melena, hematochezia, lower extremity swelling.    Physical Exam  General:  Vital signs reviewed. Well-developed, well-nourished patient in NAD.  Awake and cooperative  Neck:  no goiter  Lungs:  clear to auscultation bilaterally without wheezing, rales, or rhonchi.  Normal work of breathing  Heart:  Regular rate and rhythm without murmur, rub, or gallop.  Normal S1/S2  Abdomen:  soft, non-tender, and normal bowel sounds.   Extremities:  No clubbing, cyanosis, edema, or deformity noted with normal full range of motion of all joints.    No edema or erythema Neurologic:  strength and sensation grossly intact Skin:  no lesions or rashes   Impression & Recommendations:  Problem # 1:  HYPERTENSION, BENIGN SYSTEMIC (ICD-401.1) At goal systolic, still above 90 diastolic on repeat.  No changes to medications today, but will consider adding Norvasc in future if continues to remain elevated.   Her updated medication list for this problem includes:    Hydrochlorothiazide 25 Mg Tabs (Hydrochlorothiazide) .Marland Kitchen... Take 1 tab by mouth every morning    Metoprolol Succinate 100 Mg Xr24h-tab (Metoprolol succinate) .Marland Kitchen... 1 tablet by mouth daily for high blood pressure  Orders: [facility]- Est  Level 4 (99214)  Problem # 2:  BRONCHITIS, ACUTE (ICD-466.0) Assessment: Improved REsolved.  Will remove from problem list.   The following medications were  removed from the medication list:    Singulair 10 Mg Tabs (Montelukast sodium) .Marland Kitchen... 1 tab by mouth daily for allergies    Zithromax 250 Mg Tabs (Azithromycin) .Marland Kitchen... 2 by mouth day one then 1 by mouth daily Her updated medication list for this problem includes:    Ventolin Hfa 108 (90 Base) Mcg/act Aers (Albuterol sulfate) .Marland Kitchen... 2 puffs every 4hours as needed for wheezing    Flovent Hfa 110 Mcg/act Aero (Fluticasone propionate  hfa) ..... Inhale 2 puffs in am and 2 puffs in pm  Problem # 3:  GASTROESOPHAGEAL REFLUX, NO ESOPHAGITIS (ICD-530.81) Wants to increase Nexium to twice daily for short term.  Not currently taking Ranitidine while on twice daily Nexium, but will start it again she says when she goes back to once daily Nexium.  If no decrease in symptoms,  consider H pylori testing next visit.  Cannot find in records if this has been done in past.   Her updated medication list for this problem includes:    Nexium 40 Mg Cpdr (Esomeprazole magnesium) .Marland Kitchen... 1 tab by mouth once daily as needed for reflux    Ranitidine Hcl 150 Mg Caps (Ranitidine hcl) .Marland Kitchen... Take 1 by mouth two times a day  Orders: [facility]- Est  Level 4 ([address]109)  Problem # 4:  ALLERGIC RHINITIS (ICD-477.9) Controlled on Zyrtec.   The following medications were removed from the medication list:    Zyrtec Allergy 10 Mg Tabs (Cetirizine hcl) ..... One tablet by mouth daily Her updated medication list for this problem includes:    Fluticasone Propionate 50 Mcg/act Susp (Fluticasone propionate) .Marland Kitchen... 2 sprays each nostril daily    Fexofenadine Hcl 180 Mg Tabs (Fexofenadine hcl) .Marland Kitchen... Take 1 daily for allergies  Complete Medication List: 1)  Hydrochlorothiazide 25 Mg Tabs (Hydrochlorothiazide) .... Take 1 tab by mouth every morning 2)  Synthroid 25 Mcg Tabs (Levothyroxine sodium) .... Take 1 tablet by mouth once a day 3)  Metoprolol Succinate 100 Mg Xr24h-tab (Metoprolol succinate) .Marland Kitchen.. 1 tablet by mouth daily for high blood  pressure 4)  Adult Aspirin Ec Low Strength 81 Mg Tbec (Aspirin) .... One tab by mouth qday 5)  Fluticasone Propionate 50 Mcg/act Susp (Fluticasone propionate) .... 2 sprays each nostril daily 6)  Nexium 40 Mg Cpdr (Esomeprazole magnesium) .Marland Kitchen.. 1 tab by mouth once daily as needed for reflux 7)  Ventolin Hfa 108 (90 Base) Mcg/act Aers (Albuterol sulfate) .... 2 puffs every 4hours as needed for wheezing 8)  Flovent Hfa 110 Mcg/act Aero (Fluticasone propionate  hfa) .... Inhale 2 puffs in am and 2 puffs in pm 9)  Ranitidine Hcl 150 Mg Caps (Ranitidine hcl) .... Take 1 by mouth two times a day 10)  Fexofenadine Hcl 180 Mg Tabs (Fexofenadine hcl) .... Take 1 daily for allergies  Patient Instructions: 1)  We've sent in all of your refills.   2)  I'm glad the chest cold has cleared up.   3)  If you start having problems with the Rosacea, let me know, otherwise it should be fine.   Prescriptions: NEXIUM 40 MG CPDR (ESOMEPRAZOLE MAGNESIUM) 1 tab by mouth once daily as needed for reflux  #60 x 3   Entered and Authorized by:   Renold Don MD   Signed by:   Renold Don MD on 06/11/2010   Method used:   Electronically to        [facility]  [address] # [id number]* (retail)       [address]       [place], [place]  [address]       Ph: [phone] or [phone]       Fax: [phone]   RxID:   [id number] METOPROLOL SUCCINATE 100 MG XR24H-TAB (METOPROLOL SUCCINATE) 1 tablet by mouth daily for high blood pressure  #90 x 1   Entered and Authorized by:   Renold Don MD   Signed by:   Renold Don MD on 06/11/2010   Method used:   Electronically to        [facility]  [address] # [id number]* (retail)       [address]       [place], [place]  [address]       Ph: [phone] or [phone]  Fax: [phone]   RxID:   [id number] SYNTHROID 25 MCG TABS (LEVOTHYROXINE SODIUM) Take 1 tablet by mouth once a day  #30 Tablet x 0   Entered and Authorized by:    Renold Don MD   Signed by:   Renold Don MD on 06/11/2010   Method used:   Electronically to        [facility]  [address] # [id number]* (retail)       [address]       [place], [place]  [address]       Ph: [phone] or [phone]       Fax: [phone]   RxID:   [id number] HYDROCHLOROTHIAZIDE 25 MG  TABS (HYDROCHLOROTHIAZIDE) Take 1 tab by mouth every morning  #90 x 1   Entered and Authorized by:   Renold Don MD   Signed by:   Renold Don MD on 06/11/2010   Method used:   Electronically to        [facility]  [address] # [id number]* (retail)       [address]       [place], [place]  [address]       Ph: [phone] or [phone]       Fax: [phone]   RxID:   [id number]    Orders Added: 1)  [facility]- Est  Level 4 [[address]00]

## 2010-06-21 NOTE — Assessment & Plan Note (Signed)
Summary: pt still ill/coughin/need antibiotic/walden/bmc   Vital Signs:  Patient profile:   55 year old female Height:      64.75 inches Weight:      214.3 pounds BMI:     36.07 Temp:     97.8 degrees F oral Pulse rate:   74 / minute BP sitting:   151 / 89  (left arm) Cuff size:   large  Vitals Entered By: Garen Grams LPN (April 26, 2010 11:53 AM) CC: still having head congestion and cough, URI symptoms Is Patient Diabetic? No Pain Assessment Patient in pain? no        Primary Care Provider:  Renold Don MD  CC:  still having head congestion and cough and URI symptoms.  History of Present Illness:       This is a 55 year old woman who presents with URI symptoms.  The patient complains of nasal congestion, clear nasal discharge, and productive cough.  The patient denies fever.  The patient also reports sneezing and seasonal symptoms.  The patient denies the following risk factors for Strep sinusitis: unilateral facial pain, double sickening, and tooth pain.    Habits & Providers  Alcohol-Tobacco-Diet     Alcohol drinks/day: >5     Alcohol Counseling: to decrease amount and/or frequency of alcohol intake     Alcohol type: beer     >5/day in last 3 mos: yes     Tobacco Status: quit     Tobacco Counseling: to quit use of tobacco products     Year Quit: 1990  Current Medications (verified): 1)  Hydrochlorothiazide 25 Mg  Tabs (Hydrochlorothiazide) .... Take 1 Tab By Mouth Every Morning 2)  Synthroid 25 Mcg Tabs (Levothyroxine Sodium) .... Take 1 Tablet By Mouth Once A Day 3)  Metoprolol Succinate 100 Mg Xr24h-Tab (Metoprolol Succinate) .Marland Kitchen.. 1 Tablet By Mouth Daily For High Blood Pressure 4)  Adult Aspirin Ec Low Strength 81 Mg  Tbec (Aspirin) .... One Tab By Mouth Qday 5)  Fluticasone Propionate 50 Mcg/act Susp (Fluticasone Propionate) .... 2 Sprays Each Nostril Daily 6)  Nexium 40 Mg Cpdr (Esomeprazole Magnesium) .Marland Kitchen.. 1 Tab By Mouth Once Daily As Needed For Reflux 7)   Zyrtec Allergy 10 Mg Tabs (Cetirizine Hcl) .... One Tablet By Mouth Daily 8)  Singulair 10 Mg Tabs (Montelukast Sodium) .Marland Kitchen.. 1 Tab By Mouth Daily For Allergies 9)  Ventolin Hfa 108 (90 Base) Mcg/act Aers (Albuterol Sulfate) .... 2 Puffs Every 4hours As Needed For Wheezing 10)  Qvar 80 Mcg/act Aers (Beclomethasone Dipropionate) .... 2 Puffs Daily 11)  Ranitidine Hcl 150 Mg Caps (Ranitidine Hcl) .... Take 1 By Mouth Two Times A Day 12)  Prednisone 20 Mg Tabs (Prednisone) .... 2 Tabs Daily X 5 Days 13)  Zithromax 250 Mg Tabs (Azithromycin) .... 2 By Mouth Day One Then 1 By Mouth Daily  Allergies (verified): 1)  ! Penicillin 2)  Ace Inhibitors  Past History:  Past Medical History: Last updated: 10/18/2008 HTN High risk alcohol abuse along with husband GERD Rosacea C5-6 disk disease  Multiple pyelonephritis infections as child Previous smoker Allergic rhinitis  Past Surgical History: Last updated: 10/18/2008 Appendectomy Lipoma removal right axilla as child Tonsillectomy/Adenoidectomy Transverse C/S w/BTL  PFTs-Ratio 94% - 10/12/2004  Family History: Last updated: 07/17/2006 Father lung cancer, CAD (mult MI) first age 55, HTN, high cholesterol.  Mom had emphysema, d/o ALS.  Mom had mastectomy for `calcifications`., Mom -emphysema, Ca deposit in br s/p mastectomy  Social History:  Last updated: 11/28/2009 married with 2 children, 5 grandchildren.  No tobacco.  5-6 beers/night. Quit marijuana 10 weeks ago after 40 year history due to finances.  No cocaine.  On disablity.  No DUIs  Risk Factors: Alcohol Use: >5 (04/26/2010) >5 drinks/d w/in last 3 months: yes (04/26/2010)  Risk Factors: Smoking Status: quit (04/26/2010)  Review of Systems  The patient denies anorexia, fever, weight loss, weight gain, chest pain, syncope, dyspnea on exertion, peripheral edema, prolonged cough, headaches, abdominal pain, hematochezia, and severe indigestion/heartburn.    Physical  Exam  General:  alert and overweight-appearing.   Head:  normocephalic and atraumatic.   Ears:  External ear exam shows no significant lesions or deformities.  Otoscopic examination reveals clear canals, tympanic membranes are intact bilaterally without bulging, retraction, inflammation or discharge. Hearing is grossly normal bilaterally. Mouth:  good dentition and pharynx pink and moist.   Neck:  supple.   Lungs:  normal respiratory effort and normal breath sounds.   Heart:  normal rate, regular rhythm, and no murmur.   Abdomen:  soft, non-tender, and normal bowel sounds.     Impression & Recommendations:  Problem # 1:  BRONCHITIS, ACUTE (ICD-466.0) Has had anti-histamine, singulair, flonase, and prednisone burst with continued problems x 4 wks.  Will treat presumptively for bronchitis. Her updated medication list for this problem includes:    Singulair 10 Mg Tabs (Montelukast sodium) .Marland Kitchen... 1 tab by mouth daily for allergies    Ventolin Hfa 108 (90 Base) Mcg/act Aers (Albuterol sulfate) .Marland Kitchen... 2 puffs every 4hours as needed for wheezing    Qvar 80 Mcg/act Aers (Beclomethasone dipropionate) .Marland Kitchen... 2 puffs daily    Zithromax 250 Mg Tabs (Azithromycin) .Marland Kitchen... 2 by mouth day one then 1 by mouth daily  Complete Medication List: 1)  Hydrochlorothiazide 25 Mg Tabs (Hydrochlorothiazide) .... Take 1 tab by mouth every morning 2)  Synthroid 25 Mcg Tabs (Levothyroxine sodium) .... Take 1 tablet by mouth once a day 3)  Metoprolol Succinate 100 Mg Xr24h-tab (Metoprolol succinate) .Marland Kitchen.. 1 tablet by mouth daily for high blood pressure 4)  Adult Aspirin Ec Low Strength 81 Mg Tbec (Aspirin) .... One tab by mouth qday 5)  Fluticasone Propionate 50 Mcg/act Susp (Fluticasone propionate) .... 2 sprays each nostril daily 6)  Nexium 40 Mg Cpdr (Esomeprazole magnesium) .Marland Kitchen.. 1 tab by mouth once daily as needed for reflux 7)  Zyrtec Allergy 10 Mg Tabs (Cetirizine hcl) .... One tablet by mouth daily 8)  Singulair 10  Mg Tabs (Montelukast sodium) .Marland Kitchen.. 1 tab by mouth daily for allergies 9)  Ventolin Hfa 108 (90 Base) Mcg/act Aers (Albuterol sulfate) .... 2 puffs every 4hours as needed for wheezing 10)  Qvar 80 Mcg/act Aers (Beclomethasone dipropionate) .... 2 puffs daily 11)  Ranitidine Hcl 150 Mg Caps (Ranitidine hcl) .... Take 1 by mouth two times a day 12)  Prednisone 20 Mg Tabs (Prednisone) .... 2 tabs daily x 5 days 13)  Zithromax 250 Mg Tabs (Azithromycin) .... 2 by mouth day one then 1 by mouth daily  Patient Instructions: 1)  Please schedule a follow-up appointment in 1 month.  Prescriptions: ZITHROMAX 250 MG TABS (AZITHROMYCIN) 2 by mouth day one then 1 by mouth daily  #6 x 0   Entered and Authorized by:   Tinnie Gens MD   Signed by:   Tinnie Gens MD on 04/26/2010   Method used:   Electronically to        [facility] # [id number]* (  retail)       [address]       [place], [place]  [address]       Ph: [phone] or [phone]       Fax: [phone]   RxID:   [id number]   Appended Document: pt still ill/coughin/need antibiotic/[facility]den/bmc    Clinical Lists Changes  Orders: Added new Test order of [facility]- Est Level  3 ([address]63) - Signed

## 2010-07-12 ENCOUNTER — Other Ambulatory Visit: Payer: Self-pay | Admitting: Family Medicine

## 2010-07-12 NOTE — Telephone Encounter (Signed)
Please review and refill

## 2010-08-14 ENCOUNTER — Other Ambulatory Visit: Payer: Self-pay | Admitting: Family Medicine

## 2010-08-14 NOTE — Telephone Encounter (Signed)
Refill request

## 2010-10-14 ENCOUNTER — Other Ambulatory Visit: Payer: Self-pay | Admitting: Family Medicine

## 2010-10-15 NOTE — Telephone Encounter (Signed)
Refill request

## 2010-11-06 ENCOUNTER — Other Ambulatory Visit: Payer: Self-pay | Admitting: Family Medicine

## 2010-11-06 NOTE — Telephone Encounter (Signed)
Refill request

## 2010-11-09 ENCOUNTER — Other Ambulatory Visit: Payer: Self-pay | Admitting: Family Medicine

## 2010-11-09 NOTE — Telephone Encounter (Signed)
Refill request

## 2010-12-25 ENCOUNTER — Ambulatory Visit (INDEPENDENT_AMBULATORY_CARE_PROVIDER_SITE_OTHER): Payer: Medicare Other | Admitting: Family Medicine

## 2010-12-25 VITALS — BP 136/102 | HR 72 | Temp 98.2°F | Ht 64.0 in | Wt 216.2 lb

## 2010-12-25 DIAGNOSIS — Z0000 Encounter for general adult medical examination without abnormal findings: Secondary | ICD-10-CM

## 2010-12-25 DIAGNOSIS — R21 Rash and other nonspecific skin eruption: Secondary | ICD-10-CM

## 2010-12-25 LAB — POCT SKIN KOH: Skin KOH, POC: NEGATIVE

## 2010-12-26 DIAGNOSIS — Z1211 Encounter for screening for malignant neoplasm of colon: Secondary | ICD-10-CM | POA: Insufficient documentation

## 2010-12-26 DIAGNOSIS — Z0000 Encounter for general adult medical examination without abnormal findings: Secondary | ICD-10-CM | POA: Insufficient documentation

## 2010-12-26 DIAGNOSIS — R21 Rash and other nonspecific skin eruption: Secondary | ICD-10-CM | POA: Insufficient documentation

## 2010-12-26 MED ORDER — NYSTATIN 100000 UNIT/GM EX CREA: TOPICAL_CREAM | Freq: Two times a day (BID) | CUTANEOUS | Status: DC

## 2010-12-26 NOTE — Assessment & Plan Note (Signed)
KOH scraping today, though not likely.  Looks like candidal yeast.   Will send in Nystatin cream.

## 2010-12-26 NOTE — Assessment & Plan Note (Signed)
Mammogram ordered today.  Pap smear in 1-2 months.

## 2010-12-26 NOTE — Progress Notes (Signed)
  Subjective:    Patient ID: Lisa [name], female    DOB: 01/19/1956, 55 y.o.   MRN: [id number]  HPI 1.  Rash under arms:  Redness under arms, present for several days.  Bilateral, in armpits.  Itches.  Has not tried putting anything on it, did not know what to put on it.  No rashes elsewhere, including groin.  No recent antibiotic use, no new shampoos, soaps, detergents.  No nausea or vomiting.     Review of Systems See HPI above for review of systems.       Objective:   Physical Exam Gen:  Alert, cooperative patient who appears stated age in no acute distress.  Vital signs reviewed. Skin:  Erythema with some satellite lesions noted BL axillae.  Skin scraping performed.  Multiple skin tags Left axillae.         Assessment & Plan:

## 2010-12-28 ENCOUNTER — Telehealth: Payer: Self-pay | Admitting: Family Medicine

## 2010-12-28 DIAGNOSIS — Z1231 Encounter for screening mammogram for malignant neoplasm of breast: Secondary | ICD-10-CM

## 2010-12-28 NOTE — Telephone Encounter (Signed)
states that the type of order that Dr Gwendolyn Grant place for her mammogram, that her insurance won't pay for.  Needs it changed in order for her to have mammogram

## 2010-12-30 ENCOUNTER — Other Ambulatory Visit: Payer: Self-pay | Admitting: Family Medicine

## 2011-01-01 ENCOUNTER — Telehealth: Payer: Self-pay | Admitting: Family Medicine

## 2011-01-01 NOTE — Telephone Encounter (Signed)
Spoke with pt and told her that pcp would like for her to come in to be seen. She has an appt on 8.16.Marland KitchenMarland KitchenLoralee Pacas [place]

## 2011-01-01 NOTE — Telephone Encounter (Signed)
States that the cream she was given has broken her out all under arm/side and breast.  Wants to know if he can call something else in [facility] [address]

## 2011-01-01 NOTE — Telephone Encounter (Signed)
Unclear what is going on, especially as Nystatin has caused her to break out.  Recommend she come in to be seen again.

## 2011-01-01 NOTE — Telephone Encounter (Signed)
Has an appointment 8/16

## 2011-01-01 NOTE — Telephone Encounter (Signed)
Huntley Dec (clinical staff) called and found out correct diagnosis code.  Entered this directly.

## 2011-01-03 ENCOUNTER — Ambulatory Visit (INDEPENDENT_AMBULATORY_CARE_PROVIDER_SITE_OTHER): Payer: Medicare Other | Admitting: Family Medicine

## 2011-01-03 VITALS — BP 160/104 | HR 80 | Temp 97.7°F | Ht 64.0 in | Wt 218.7 lb

## 2011-01-03 DIAGNOSIS — I10 Essential (primary) hypertension: Secondary | ICD-10-CM

## 2011-01-03 DIAGNOSIS — Z0000 Encounter for general adult medical examination without abnormal findings: Secondary | ICD-10-CM

## 2011-01-03 DIAGNOSIS — R21 Rash and other nonspecific skin eruption: Secondary | ICD-10-CM

## 2011-01-03 DIAGNOSIS — H0264 Xanthelasma of left upper eyelid: Secondary | ICD-10-CM

## 2011-01-03 DIAGNOSIS — H0260 Xanthelasma of unspecified eye, unspecified eyelid: Secondary | ICD-10-CM

## 2011-01-03 DIAGNOSIS — E782 Mixed hyperlipidemia: Secondary | ICD-10-CM

## 2011-01-03 MED ORDER — TRIAMCINOLONE ACETONIDE 0.1 % EX CREA: TOPICAL_CREAM | Freq: Two times a day (BID) | CUTANEOUS | Status: AC

## 2011-01-03 NOTE — Assessment & Plan Note (Signed)
Xanthelasma noted.   Will obtain lipid profile at next visit, previously scheduled later this month.   Also pap at that time.

## 2011-01-03 NOTE — Assessment & Plan Note (Signed)
Elevated today.  Patient upset b/c developmentally delayed patient having blood drawn in room next door, lots of yelling and screaming from that room.  Will recheck at next visit, likely may need to add another agent.

## 2011-01-03 NOTE — Patient Instructions (Signed)
Use the Triamcinolone twice daily under your arms for the next 3 days by itself.  I sent this in already.  This if for inflammation.  Starting on Sunday, start using Lamisil under your arms twice a day.  Apply the Triamcinolone a few hours after you put on the Lamisil.  This is to kill the fungus.   If you have any questions, be sure to let us know. I would not wear deodorant until Monday if you can make it that long.   Keep your underarms as dry as possible.  Using new, clean towels is a good idea if there is a fungus there.

## 2011-01-03 NOTE — Progress Notes (Signed)
  Subjective:    Patient ID: Lisa [name], female    DOB: 01/20/1956, 55 y.o.   MRN: [id number]  HPI 1.  Rash:  Not improving.  Believes she actually started breaking out worse, having increased itching after application of Nystatin.  Has tried Cortisone and Itch-x cream with minimal relief.  Redness spreading, itching much worse, keeping her up at night.  No bleeding.  No rash elsewhere, including groin, elbows, other skin creases.  No fevers, chills.      Review of Systems See HPI above for review of systems.       Objective:   Physical Exam Gen:  Alert, cooperative patient who appears stated age in no acute distress.  Vital signs reviewed. Eye:  Probably xanthelasma noted  Skin:  Erythematous skin noted BL axillae.  Some excoriations and maceration noted.  No satellite lesions.  Erythema has extended from previous exam, about 5 cm diameter today        Assessment & Plan:

## 2011-01-03 NOTE — Assessment & Plan Note (Signed)
Now appears to have some inflammatory component, possible to vehicle of Nystatin.  Plan to treat with anti-inflammatory Triamcinolone.   Add Lamisil after couple of days once inflammation has decreased in case there is some yeast component.   FU in 2 weeks if no improvement.   I will see her later this month anyway for HTN check and preventative health care.

## 2011-01-10 ENCOUNTER — Other Ambulatory Visit: Payer: Self-pay | Admitting: Family Medicine

## 2011-01-10 NOTE — Telephone Encounter (Signed)
Refill request

## 2011-02-05 ENCOUNTER — Ambulatory Visit (INDEPENDENT_AMBULATORY_CARE_PROVIDER_SITE_OTHER): Payer: Medicare Other | Admitting: Family Medicine

## 2011-02-05 ENCOUNTER — Other Ambulatory Visit (HOSPITAL_COMMUNITY)
Admission: RE | Admit: 2011-02-05 | Discharge: 2011-02-05 | Disposition: A | Discharge status: Home / Self Care | Payer: Medicare Other | Source: Ambulatory Visit | Attending: Family Medicine | Admitting: Family Medicine

## 2011-02-05 ENCOUNTER — Ambulatory Visit
Admission: RE | Admit: 2011-02-05 | Discharge: 2011-02-05 | Disposition: A | Discharge status: Home / Self Care | Payer: Medicare Other | Source: Ambulatory Visit | Attending: Family Medicine | Admitting: Family Medicine

## 2011-02-05 VITALS — BP 139/97 | HR 88 | Temp 98.1°F | Wt 214.8 lb

## 2011-02-05 DIAGNOSIS — Z124 Encounter for screening for malignant neoplasm of cervix: Secondary | ICD-10-CM | POA: Insufficient documentation

## 2011-02-05 DIAGNOSIS — I10 Essential (primary) hypertension: Secondary | ICD-10-CM

## 2011-02-05 DIAGNOSIS — Z0000 Encounter for general adult medical examination without abnormal findings: Secondary | ICD-10-CM

## 2011-02-05 DIAGNOSIS — Z23 Encounter for immunization: Secondary | ICD-10-CM

## 2011-02-05 DIAGNOSIS — R21 Rash and other nonspecific skin eruption: Secondary | ICD-10-CM

## 2011-02-05 DIAGNOSIS — Z1231 Encounter for screening mammogram for malignant neoplasm of breast: Secondary | ICD-10-CM

## 2011-02-05 NOTE — Assessment & Plan Note (Signed)
Much better today. No change in medications. Of note medical record was unavailable at time of visit as the computers are down.

## 2011-02-05 NOTE — Progress Notes (Signed)
  Subjective:    Patient ID: Lisa [name], female    DOB: 1955/09/30, 55 y.o.   MRN: [id number]  HPI 1.  Preventative Med:  Patient here for Pap smear today. Also had mammogram earlier this morning prior to coming to clinic.  2.  Rash under arms:  Rash under arm much improved since being started on triamcinolone. Stopped her nystatin. Redness totally resolved. No further itching no nausea or vomiting. Patient very appreciative     Review of Systems See HPI above for review of systems.       Objective:   Physical Exam Gen:  Alert, cooperative patient who appears stated age in no acute distress.  Vital signs reviewed. Skin - no sores or suspicious lesions or rashes or color changes GYN:  External genitalia within normal limits.  Vaginal mucosa pink, moist, atrophic and compatible with age.  Nonfriable cervix without lesions, no discharge or bleeding noted on speculum exam.  Pap performed. Bimanual exam revealed normal, nongravid uterus.  No cervical motion tenderness. No adnexal masses bilaterally.          Assessment & Plan:

## 2011-02-05 NOTE — Assessment & Plan Note (Signed)
Pap and mammogram obtained today. Not able to obtain lipid panel secondary to computers being down and unable to place order. She'll return for this at later date.

## 2011-02-05 NOTE — Assessment & Plan Note (Signed)
Patient no longer taking medications. Very happy with the results from the triamcinolone and Lamisil. To return if she has any further problems.

## 2011-02-06 ENCOUNTER — Encounter: Payer: Self-pay | Admitting: Family Medicine

## 2011-02-10 ENCOUNTER — Other Ambulatory Visit: Payer: Self-pay | Admitting: Family Medicine

## 2011-02-10 NOTE — Telephone Encounter (Signed)
Refill request

## 2011-02-13 ENCOUNTER — Other Ambulatory Visit: Payer: Self-pay | Admitting: Family Medicine

## 2011-02-13 NOTE — Telephone Encounter (Signed)
Refill request

## 2011-02-14 NOTE — Telephone Encounter (Signed)
Refill request

## 2011-05-08 ENCOUNTER — Other Ambulatory Visit: Payer: Self-pay | Admitting: Family Medicine

## 2011-05-08 NOTE — Telephone Encounter (Signed)
Refill request

## 2011-05-20 ENCOUNTER — Other Ambulatory Visit: Payer: Self-pay | Admitting: Family Medicine

## 2011-05-22 NOTE — Telephone Encounter (Signed)
Refill request

## 2011-07-01 ENCOUNTER — Other Ambulatory Visit: Payer: Self-pay | Admitting: Family Medicine

## 2011-07-01 NOTE — Telephone Encounter (Signed)
Refill request

## 2011-08-05 ENCOUNTER — Other Ambulatory Visit: Payer: Self-pay | Admitting: Family Medicine

## 2011-08-05 NOTE — Telephone Encounter (Signed)
Refill request

## 2011-09-23 ENCOUNTER — Ambulatory Visit (INDEPENDENT_AMBULATORY_CARE_PROVIDER_SITE_OTHER): Payer: Medicare Other | Admitting: Family Medicine

## 2011-09-23 VITALS — BP 130/82 | HR 72 | Temp 98.0°F | Ht 64.0 in | Wt 222.9 lb

## 2011-09-23 DIAGNOSIS — F1010 Alcohol abuse, uncomplicated: Secondary | ICD-10-CM

## 2011-09-23 DIAGNOSIS — M79609 Pain in unspecified limb: Secondary | ICD-10-CM | POA: Diagnosis not present

## 2011-09-23 DIAGNOSIS — M109 Gout, unspecified: Secondary | ICD-10-CM

## 2011-09-23 DIAGNOSIS — M79673 Pain in unspecified foot: Secondary | ICD-10-CM

## 2011-09-23 LAB — URIC ACID: Uric Acid, Serum: 8.1 mg/dL — ABNORMAL HIGH (ref 2.4–7.0)

## 2011-09-23 MED ORDER — IBUPROFEN 600 MG PO TABS: 600.0000 mg | ORAL_TABLET | Freq: Three times a day (TID) | ORAL | Status: AC | PRN

## 2011-09-23 NOTE — Patient Instructions (Signed)
Gout  Gout is caused by a buildup of uric acid crystals in the joints. The crystals make your joints sore. This is like having sand in your joints. Repeat attacks are common. Gout can be treated.  HOME CARE     Do not take aspirin for pain.    Only take medicine as told by your doctor.    You may use cold treatments (ice) on painful joints.    Put ice in a plastic bag.    Place a towel between your skin and the bag.    Leave the ice on for 15 to 20 minutes at a time, 3 to 4 times a day.    Rest in bed as much as possible. When in bed, keep the sheets and blankets off your sore joints.    Keep the sore joints raised (elevated).    Use crutches if your legs or ankles hurt.    Drink enough water and fluids to keep your pee (urine) clear or pale yellow. This helps your body get rid of uric acid. Do not drink alcohol.    Follow diet instructions as told by your doctor.    Keep your body at a healthy weight.   GET HELP RIGHT AWAY IF:     You have a temperature by mouth above 102 F (38.9 C), not controlled by medicine.    You have watery poop (diarrhea).    You are throwing up (vomiting).    You do not feel better in 1 day, or you are getting worse.    Your joint hurts more.    You have the chills.   MAKE SURE YOU:     Understand these instructions.    Will watch your condition.    Will get help right away if you are not doing well or get worse.   Document Released: 02/13/2008 Document Revised: 04/25/2011 Document Reviewed: 08/14/2009  ExitCare Patient Information 2012 [employer].

## 2011-09-23 NOTE — Progress Notes (Signed)
Subjective:     Patient ID: Lisa [name], female   DOB: 1955-07-04, 56 y.o.   MRN: [id number]  HPI Woke up suddenly in middle of night with exquisite sharp pain across top of left foot. Was swollen and very sensitive to touch. Could not wiggle toes due to pain. Used ice pack and ibuprofen, which has relieved the pain.  This has happened before in her R foot. For past 4-5 weeks, has had excruciating pain and swelling of right big toe. Ibuprofen and ice usually helps the pain. Today, only left foot hurts.  Pain does not radiate. Is usually localized to big toe or top of all toes, and is always described as sharp. Swelling usually present with these flares. No redness.  Yesterday, ate fried chicken, mashed potatoes, and black eyed peas. Consumes about 13 beers each night.  No fevers, chills, night sweats.    Review of Systems     Objective:   Physical Exam Gen: well-appearing, nad Left Foot: TTP at L 2nd metatarsal. 1st metatarsal joint is not TTP. Non-erythematous across foot. Edema over 2nd metatarsal. Right foot: No TTP, No edema, Non-erythematous.    Assessment:          Plan:     Acute pain over single joint in left foot, relieved with NSAIDs and ice. Ddx includes gout, septic joint, osteoarthritis, cellulitis. No concern for infectious cause at this time. Esp with hx of chronic alcohol abuse, HTN, and single joint tenderness, gout is most likely diagnosis, although not classic 1st metatarsal in left foot. She has a hx of classic manifestation of gout in her right foot. Will treat with ibuprofen 600mg  q6 hours. Counseled on inciting factors for gout, including red meat and alcohol consumption. Lisa [name] says that she is willing to cut back, and we advised that we'd be happy to guide her, as alcohol needs to be tapered down slowly.

## 2011-09-24 DIAGNOSIS — M109 Gout, unspecified: Secondary | ICD-10-CM | POA: Insufficient documentation

## 2011-09-24 NOTE — Progress Notes (Signed)
  Subjective:    Patient ID: Lisa [name], female    DOB: [birth year], 56 y.o.   MRN: [id number]  HPI Work in appt for acute mid left foot pain Woke her from sleep last ngiht.  Was extremely painful and swollen.  Iced it and took ibuprofen,  Which has helped.  No trauma or injury.  Notes similar pain happened several weeks ago on right great toe, acute onset of pain ans swelling relieved by ice and ibuprofen.  Seems to be provoked by alcohol binges.   Had 13 cans of beer yesterday.  I have reviewed patient's  PMH, FH, and Social history and Medications as related to this visit.  No history of gout  Review of Systems     Objective:   Physical Exam GEN: Alert & Oriented, No acute distress CV:  Regular Rate & Rhythm, no murmur Respiratory:  Normal work of breathing, CTAB Abd:  + BS, soft, no tenderness to palpation Left foot:  Mod Painful over midfoot, small area of swelling, no erythema       Assessment & Plan:

## 2011-09-24 NOTE — Assessment & Plan Note (Signed)
Discussed relation of gout to alcohol use.  She is agreeable to cutting back although husband affirms he will maintain his current drinking level.  Encouraged her not to quit "cold [place]" but to instead gradually cut back.  Offered additional resources if interested.  Will follow-up with PCP

## 2011-09-24 NOTE — Assessment & Plan Note (Signed)
Previous attack classic for gout at first MTP, today's episode consistent given rapid onset and triggered after alcohol binge.  No evidence of cellulitis.  Getting good response with OTC NSAID and ice.  Will rx Ibuprofen 600 mg TID and continue to ice.  Uric acid elevated.   Clinically consistent with gout. Advised next time she has a flare to come in for evaluation, possible aspiration.  This far, has been mild and not recurrent.  If she continues to have flares, may consider prophylactic therapy.

## 2011-09-30 ENCOUNTER — Other Ambulatory Visit: Payer: Self-pay | Admitting: Family Medicine

## 2011-10-02 ENCOUNTER — Other Ambulatory Visit: Payer: Self-pay | Admitting: Family Medicine

## 2011-10-28 ENCOUNTER — Encounter: Payer: Self-pay | Admitting: Family Medicine

## 2011-10-28 ENCOUNTER — Ambulatory Visit (INDEPENDENT_AMBULATORY_CARE_PROVIDER_SITE_OTHER): Payer: Medicare Other | Admitting: Family Medicine

## 2011-10-28 VITALS — BP 138/96 | Temp 97.6°F | Ht 64.0 in | Wt 219.0 lb

## 2011-10-28 DIAGNOSIS — M109 Gout, unspecified: Secondary | ICD-10-CM

## 2011-10-28 DIAGNOSIS — F1010 Alcohol abuse, uncomplicated: Secondary | ICD-10-CM

## 2011-10-28 DIAGNOSIS — I10 Essential (primary) hypertension: Secondary | ICD-10-CM

## 2011-10-28 LAB — COMPREHENSIVE METABOLIC PANEL
ALT: 30 U/L (ref 0–35)
AST: 30 U/L (ref 0–37)
Albumin: 4.4 g/dL (ref 3.5–5.2)
Alkaline Phosphatase: 78 U/L (ref 39–117)
BUN: 15 mg/dL (ref 6–23)
CO2: 24 mEq/L (ref 19–32)
Calcium: 10.2 mg/dL (ref 8.4–10.5)
Chloride: 103 mEq/L (ref 96–112)
Creat: 0.74 mg/dL (ref 0.50–1.10)
Glucose, Bld: 108 mg/dL — ABNORMAL HIGH (ref 70–99)
Potassium: 3.8 mEq/L (ref 3.5–5.3)
Sodium: 141 mEq/L (ref 135–145)
Total Bilirubin: 0.6 mg/dL (ref 0.3–1.2)
Total Protein: 6.8 g/dL (ref 6.0–8.3)

## 2011-10-28 LAB — CBC
HCT: 45.3 % (ref 36.0–46.0)
Hemoglobin: 15.7 g/dL — ABNORMAL HIGH (ref 12.0–15.0)
MCH: 31.9 pg (ref 26.0–34.0)
MCHC: 34.7 g/dL (ref 30.0–36.0)
MCV: 92.1 fL (ref 78.0–100.0)
Platelets: 262 10*3/uL (ref 150–400)
RBC: 4.92 MIL/uL (ref 3.87–5.11)
RDW: 13.4 % (ref 11.5–15.5)
WBC: 10.1 10*3/uL (ref 4.0–10.5)

## 2011-10-28 LAB — LIPID PANEL
Cholesterol: 218 mg/dL — ABNORMAL HIGH (ref 0–200)
HDL: 59 mg/dL (ref >39)
LDL Cholesterol: 134 mg/dL — ABNORMAL HIGH (ref 0–99)
Total CHOL/HDL Ratio: 3.7 Ratio
Triglycerides: 124 mg/dL (ref <150)
VLDL: 25 mg/dL (ref 0–40)

## 2011-10-28 NOTE — Assessment & Plan Note (Signed)
Cut back to half previous usage.  Unsure if she desires to cut back further.  Counseled on dangers of chronic alcohol use.  Also counseled on dangers of sudden cessation.  Discussed that medications can help with withdrawal symptoms as well as other resources available.   Patient will think about this and decide.   Stated "open door" policy to discuss

## 2011-10-28 NOTE — Assessment & Plan Note (Signed)
Resolved. Does not desire "any more medicines." Hold off on gout prevention currently, despite elevated uric acid levels.  Discussed she may indeed have another attack and dangers of chronic tophi.   Patient elected no medications at this time. If another attack, will start allopurinol.   She is cutting back on EtOH, see above.

## 2011-10-28 NOTE — Progress Notes (Signed)
  Subjective:    Patient ID: Lisa [name], female    DOB: [birth year], 56 y.o.   MRN: [id number]  HPI  1.  FU for gout:  Doing well currently.  No further attacks.  Pain relieved by Ibuprofen 800 mg.  Never had gout attack before.  Uric acid level came back high at 8.1  No pain currently, decreasing alcohol usage.    2. Hypertension:  Long-term problem for this patient.  No adverse effects from medication.  Not checking it regularly.  No HA, CP, dizziness, shortness of breath, palpitations, or LE swelling.   BP Readings from Last 3 Encounters:  10/28/11 142/100  09/23/11 130/82  02/05/11 139/97   3.  Chronic alcohol use:  States she is gradually cutting back.  CAGE questions positive for desire to cut back, otherwise negative.  Barriers to cutting back further are husband's continued usage.  He does not desire to cut back.  Declined any   The following portions of the patient's history were reviewed and updated as appropriate: allergies, current medications, past medical history, family and social history, and problem list.  Patient is a nonsmoker.  Review of Systems See HPI above for review of systems.       Objective:   Physical Exam BP 142/100  Temp(Src) 97.6 F (36.4 C) (Oral)  Ht 5\' 4"  (1.626 m)  Wt 219 lb (99.338 kg)  BMI 37.59 kg/m2 Gen: Well NAD HEENT: EOMI,  MMM Lungs: CTABL Nl WOB Heart: RRR no MRG Abd: NABS, NT, ND Exts: Non edematous BL  LE, warm and well perfused.  No joint swelling or tenderness. Skin:  No erythema noted       Assessment & Plan:

## 2011-10-28 NOTE — Patient Instructions (Signed)
It was good to see you today.  I'm glad that your foot's doing much better.  If you have another attack let me know and we'll start the preventative medicine.

## 2011-10-28 NOTE — Assessment & Plan Note (Signed)
Initially elevated. Repeat better systolic, but still elevated diastolic.   Patient admits to intermittent medication use. Counseled on compliance.  No changes today, to continue working on compliance. CMET, CBC, lipids today.

## 2011-11-05 ENCOUNTER — Other Ambulatory Visit: Payer: Self-pay | Admitting: Family Medicine

## 2011-12-30 ENCOUNTER — Other Ambulatory Visit: Payer: Self-pay | Admitting: Family Medicine

## 2012-01-22 ENCOUNTER — Other Ambulatory Visit: Payer: Self-pay | Admitting: Family Medicine

## 2012-02-05 ENCOUNTER — Other Ambulatory Visit: Payer: Self-pay | Admitting: Family Medicine

## 2012-02-27 ENCOUNTER — Ambulatory Visit (INDEPENDENT_AMBULATORY_CARE_PROVIDER_SITE_OTHER): Payer: Medicare Other | Admitting: *Deleted

## 2012-02-27 DIAGNOSIS — Z23 Encounter for immunization: Secondary | ICD-10-CM | POA: Diagnosis not present

## 2012-02-28 ENCOUNTER — Ambulatory Visit: Payer: Medicare Other

## 2012-03-02 ENCOUNTER — Other Ambulatory Visit: Payer: Self-pay | Admitting: Family Medicine

## 2012-03-05 ENCOUNTER — Other Ambulatory Visit: Payer: Self-pay | Admitting: Family Medicine

## 2012-03-06 ENCOUNTER — Encounter: Payer: Self-pay | Admitting: Family Medicine

## 2012-03-06 ENCOUNTER — Ambulatory Visit (INDEPENDENT_AMBULATORY_CARE_PROVIDER_SITE_OTHER): Payer: Medicare Other | Admitting: Family Medicine

## 2012-03-06 VITALS — BP 148/98 | HR 72 | Temp 98.4°F | Ht 65.0 in | Wt 224.9 lb

## 2012-03-06 DIAGNOSIS — I10 Essential (primary) hypertension: Secondary | ICD-10-CM

## 2012-03-06 DIAGNOSIS — E785 Hyperlipidemia, unspecified: Secondary | ICD-10-CM

## 2012-03-06 DIAGNOSIS — E039 Hypothyroidism, unspecified: Secondary | ICD-10-CM

## 2012-03-06 DIAGNOSIS — F1010 Alcohol abuse, uncomplicated: Secondary | ICD-10-CM

## 2012-03-06 DIAGNOSIS — K219 Gastro-esophageal reflux disease without esophagitis: Secondary | ICD-10-CM

## 2012-03-06 MED ORDER — ALBUTEROL SULFATE HFA 108 (90 BASE) MCG/ACT IN AERS: 2.0000 | INHALATION_SPRAY | RESPIRATORY_TRACT | Status: DC | PRN

## 2012-03-06 NOTE — Patient Instructions (Addendum)
We are checking your blood levels for thyroid today.   It was good to see you again today.  Follow up with me in 6 months.

## 2012-03-06 NOTE — Progress Notes (Signed)
  Subjective:    Patient ID: Lisa [name], female    DOB: Dec 11, 1955, 56 y.o.   MRN: [id number]  HPI  Hypertension:  Long-term problem for this patient.  No adverse effects from medication.  Not checking it regularly.  No HA, CP, dizziness, shortness of breath, palpitations, or LE swelling.   BP Readings from Last 3 Encounters:  03/06/12 148/98  10/28/11 138/96  09/23/11 130/82    HLD:  Last lipid panel listed below.    Currently is/not on Statin.  Denies any myalgias, icterus, jaundice.  Tolerating medications well.   Lab Results  Component Value Date   LDLCALC 134* 10/28/2011    Hypothyroidism:  Patient needs to have TSH drawn, last was in 2011.  Denies any hair gain or loss.  She and husband both prefer cold weather, no fevers or chills.  Progressively gaining weight over past several years but none acutely.  No changes in mood.    Review of Systems See HPI above for review of systems.       Objective:   Physical Exam Gen:  Alert, cooperative patient who appears stated age in no acute distress.  Vital signs reviewed. HEENT:  /AT.  EOMI, PERRL.  MMM, tonsils non-erythematous, non-edematous.  External ears WNL, Bilateral TM's normal without retraction, redness or bulging.  Cardiac:  Regular rate and rhythm without murmur auscultated.  Good S1/S2. Pulm:  Clear to auscultation bilaterally with good air movement.  No wheezes or rales noted.   Ext:  No clubbing/cyanosis/erythema.  No edema noted bilateral lower extremities.          Assessment & Plan:

## 2012-03-06 NOTE — Assessment & Plan Note (Signed)
Patient and husband gradually cutting back with goal of quitting Jan 1.

## 2012-03-06 NOTE — Assessment & Plan Note (Signed)
Not quite at goal.   Recommended diet changes.  Recheck in 6 months.   Consider statin at that point if still elevated.

## 2012-03-06 NOTE — Assessment & Plan Note (Signed)
Elevated today.  Has been at goal previously. FU in 1 month for recheck

## 2012-03-06 NOTE — Assessment & Plan Note (Signed)
Recheck tsh.  Synthroid refilled

## 2012-03-06 NOTE — Assessment & Plan Note (Signed)
Refilled nexium.   Removed ranitidine from med list as she is not taking this

## 2012-03-07 LAB — TSH: TSH: 3.883 u[IU]/mL (ref 0.350–4.500)

## 2012-03-09 ENCOUNTER — Encounter: Payer: Self-pay | Admitting: Family Medicine

## 2012-04-01 ENCOUNTER — Other Ambulatory Visit: Payer: Self-pay | Admitting: Family Medicine

## 2012-04-20 ENCOUNTER — Other Ambulatory Visit: Payer: Self-pay | Admitting: Family Medicine

## 2012-05-05 ENCOUNTER — Other Ambulatory Visit: Payer: Self-pay | Admitting: Family Medicine

## 2012-05-28 ENCOUNTER — Other Ambulatory Visit: Payer: Self-pay | Admitting: Family Medicine

## 2012-05-29 NOTE — Telephone Encounter (Signed)
Not our patient. Not in [facility] or Epic for [place].

## 2012-05-31 ENCOUNTER — Other Ambulatory Visit: Payer: Self-pay | Admitting: Family Medicine

## 2012-06-23 ENCOUNTER — Other Ambulatory Visit: Payer: Self-pay | Admitting: Family Medicine

## 2012-08-04 ENCOUNTER — Other Ambulatory Visit: Payer: Self-pay | Admitting: Family Medicine

## 2012-09-28 ENCOUNTER — Other Ambulatory Visit: Payer: Self-pay | Admitting: Family Medicine

## 2012-09-29 ENCOUNTER — Other Ambulatory Visit: Payer: Self-pay | Admitting: Family Medicine

## 2012-10-13 ENCOUNTER — Ambulatory Visit (INDEPENDENT_AMBULATORY_CARE_PROVIDER_SITE_OTHER): Payer: Medicare Other | Admitting: Family Medicine

## 2012-10-13 VITALS — BP 140/90 | HR 67 | Temp 97.9°F | Ht 64.0 in | Wt 228.0 lb

## 2012-10-13 DIAGNOSIS — E038 Other specified hypothyroidism: Secondary | ICD-10-CM | POA: Diagnosis not present

## 2012-10-13 DIAGNOSIS — I10 Essential (primary) hypertension: Secondary | ICD-10-CM | POA: Diagnosis not present

## 2012-10-13 DIAGNOSIS — D1779 Benign lipomatous neoplasm of other sites: Secondary | ICD-10-CM | POA: Diagnosis not present

## 2012-10-13 DIAGNOSIS — M2240 Chondromalacia patellae, unspecified knee: Secondary | ICD-10-CM

## 2012-10-13 DIAGNOSIS — M2241 Chondromalacia patellae, right knee: Secondary | ICD-10-CM

## 2012-10-13 DIAGNOSIS — E669 Obesity, unspecified: Secondary | ICD-10-CM

## 2012-10-13 DIAGNOSIS — D1720 Benign lipomatous neoplasm of skin and subcutaneous tissue of unspecified limb: Secondary | ICD-10-CM

## 2012-10-13 DIAGNOSIS — J309 Allergic rhinitis, unspecified: Secondary | ICD-10-CM

## 2012-10-13 DIAGNOSIS — F1010 Alcohol abuse, uncomplicated: Secondary | ICD-10-CM

## 2012-10-13 LAB — COMPREHENSIVE METABOLIC PANEL
ALT: 28 U/L (ref 0–35)
AST: 25 U/L (ref 0–37)
Albumin: 4 g/dL (ref 3.5–5.2)
Alkaline Phosphatase: 82 U/L (ref 39–117)
BUN: 13 mg/dL (ref 6–23)
CO2: 28 mEq/L (ref 19–32)
Calcium: 9.8 mg/dL (ref 8.4–10.5)
Chloride: 101 mEq/L (ref 96–112)
Creat: 0.74 mg/dL (ref 0.50–1.10)
Glucose, Bld: 114 mg/dL — ABNORMAL HIGH (ref 70–99)
Potassium: 4 mEq/L (ref 3.5–5.3)
Sodium: 140 mEq/L (ref 135–145)
Total Bilirubin: 0.7 mg/dL (ref 0.3–1.2)
Total Protein: 6.7 g/dL (ref 6.0–8.3)

## 2012-10-13 LAB — LIPID PANEL
Cholesterol: 212 mg/dL — ABNORMAL HIGH (ref 0–200)
HDL: 59 mg/dL (ref >39)
LDL Cholesterol: 112 mg/dL — ABNORMAL HIGH (ref 0–99)
Total CHOL/HDL Ratio: 3.6 Ratio
Triglycerides: 204 mg/dL — ABNORMAL HIGH (ref <150)
VLDL: 41 mg/dL — ABNORMAL HIGH (ref 0–40)

## 2012-10-13 LAB — CBC
HCT: 45.3 % (ref 36.0–46.0)
Hemoglobin: 15.9 g/dL — ABNORMAL HIGH (ref 12.0–15.0)
MCH: 32.1 pg (ref 26.0–34.0)
MCHC: 35.1 g/dL (ref 30.0–36.0)
MCV: 91.3 fL (ref 78.0–100.0)
Platelets: 292 10*3/uL (ref 150–400)
RBC: 4.96 MIL/uL (ref 3.87–5.11)
RDW: 13.4 % (ref 11.5–15.5)
WBC: 9.9 10*3/uL (ref 4.0–10.5)

## 2012-10-13 LAB — TSH: TSH: 3.099 u[IU]/mL (ref 0.350–4.500)

## 2012-10-13 NOTE — Progress Notes (Signed)
Subjective:    Lisa [name] is a 57 y.o. female who presents to [facility] today for the following concerns:  1.  BL knee pain:  Present for past several months.  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  Pain and swelling in her knees present when washing floors and when squatting.  Describes as "grinding" and 'aching" in her knees.  No trauma, either currently or by history.  No swelling that she's noted.  Relieved by Tylenol.    2.  "Knot" in Right arm:  Present for years.  Has noted some tenderness radiating down her Right arm when she "pinches" the knot.  No weakness in her arm or hands.  No trauma here.  Wants to know what this is.    3.  Obesity:  Asking for something for weight loss.  Desires "a pill."  Has changed her diet and decreased her amount of EtOH intake (7 beers down from 13 since January of 2014).  Food intake mostly carbs, meat, few vegetables/fruits.  Little activity besides work.     The following portions of the patient's history were reviewed and updated as appropriate: allergies, current medications, past medical history, family and social history, and problem list. Patient is a nonsmoker.    PMH reviewed.  No past medical history on file. No past surgical history on file.  Medications reviewed. Current Outpatient Prescriptions  Medication Sig Dispense Refill  . albuterol (VENTOLIN HFA) 108 (90 BASE) MCG/ACT inhaler Inhale 2 puffs into the lungs every 4 (four) hours as needed. For wheezing  1 Inhaler  1  . aspirin (ADULT ASPIRIN EC LOW STRENGTH) 81 MG EC tablet Take 81 mg by mouth daily.        . cetirizine (ZYRTEC) 10 MG tablet take 1 tablet by mouth once daily  90 tablet  2  . FLOVENT HFA 110 MCG/ACT inhaler inhale 2 puffs by mouth every morning and 2 every evening  12 g  3  . fluticasone (FLONASE) 50 MCG/ACT nasal spray instill 2 sprays into each nostril once daily  16 g  5  . hydrochlorothiazide  (HYDRODIURIL) 25 MG tablet take 1 tablet by mouth every morning  90 tablet  2  . levothyroxine (SYNTHROID, LEVOTHROID) 25 MCG tablet take 1 tablet by mouth once daily (MUST MAKE APPOINTMENT FOR MORE REFILLS)  90 tablet  2  . metoprolol succinate (TOPROL-XL) 100 MG 24 hr tablet take 1 tablet by mouth once daily for high blood pressure  90 tablet  2  . montelukast (SINGULAIR) 10 MG tablet take 1 tablet by mouth once daily  30 tablet  4  . NEXIUM 40 MG capsule take 1 capsule by mouth once daily if needed for REFLUX  30 each  4   No current facility-administered medications for this visit.    ROS as above otherwise neg.  No chest pain, palpitations, SOB, Fever, Chills, Abd pain, N/V/D.   Objective:   Physical Exam BP 151/91  Pulse 67  Temp(Src) 97.9 F (36.6 C) (Oral)  Ht 5\' 4"  (1.626 m)  Wt 228 lb (103.42 kg)  BMI 39.12 kg/m2 Gen:  Alert, cooperative patient who appears stated age in no acute distress.  Vital signs reviewed. HEENT: EOMI,  MMM Cardiac:  Regular rate and rhythm without murmur auscultated.  Good S1/S2. Pulm:  Clear to auscultation bilaterally with good air movement.  No wheezes or rales noted.  Ext:  Right arm with subcutaneous nodule noted medial aspect of arm just inferior to axilla.  Mobile, rubbery, consistent with lipoma.  Minimally tender to palpation.  Knees:  RIght and Left knee bptj WNL.  BL knees: No redness or swelling noted.  Good passive/active range of motion of knee without pain.  Internal and external hip rotation good without pain.  No tenderness at medial or lateral joint line.  Unable to appreciate any joint effusion.  Anterior/posterior drawer tests, McMurray's, Lachmann's testing all negative.  Patello-femoral testing is positive for some tenderness BL.      No results found for this or any previous visit (from the past 72 hour(s)).

## 2012-10-13 NOTE — Patient Instructions (Signed)
You can try stopping the Singulair.  Use the Zyrtec when you need it.    We'll check some blood work today.  Let me know if your knees aren't getting better or your arm's bothering you more.

## 2012-10-14 ENCOUNTER — Encounter: Payer: Self-pay | Admitting: Family Medicine

## 2012-10-14 DIAGNOSIS — D1720 Benign lipomatous neoplasm of skin and subcutaneous tissue of unspecified limb: Secondary | ICD-10-CM | POA: Insufficient documentation

## 2012-10-14 DIAGNOSIS — M2241 Chondromalacia patellae, right knee: Secondary | ICD-10-CM | POA: Insufficient documentation

## 2012-10-14 NOTE — Assessment & Plan Note (Signed)
Has not taken medications today. Recommended to FU in 2 weeks for recheck of BP, take meds before nurse visit.

## 2012-10-14 NOTE — Assessment & Plan Note (Signed)
Reassurance provided as exam consistent with lipoma and present for years without any changes.   FU if she notes change in size or consistency or any worsening pain.

## 2012-10-14 NOTE — Assessment & Plan Note (Signed)
Currently controlled with Zyrtec.  Wants to stop her Fluticasone as hasn't needed this and causes headaches.

## 2012-10-14 NOTE — Assessment & Plan Note (Signed)
Not interested in cutting back further from 7 beer daily.

## 2012-10-14 NOTE — Assessment & Plan Note (Signed)
Discussed quadriceps strengthening will help with this, provided straight leg raise demonstration to help.   Mild currently, relieved by Tylenol.  No need for prescription meds. FU PRN

## 2012-10-14 NOTE — Assessment & Plan Note (Signed)
Discussed healthy eating habits, especially "half-plate" method.  Discouraged use of medications.   To increase activity more than just activity at work.

## 2012-10-26 ENCOUNTER — Other Ambulatory Visit: Payer: Self-pay | Admitting: Physician Assistant

## 2012-10-26 ENCOUNTER — Other Ambulatory Visit: Payer: Self-pay | Admitting: Family Medicine

## 2012-10-27 ENCOUNTER — Other Ambulatory Visit: Payer: Self-pay | Admitting: Family Medicine

## 2012-11-30 ENCOUNTER — Other Ambulatory Visit: Payer: Self-pay | Admitting: Family Medicine

## 2012-12-01 ENCOUNTER — Telehealth: Payer: Self-pay | Admitting: Family Medicine

## 2012-12-01 NOTE — Telephone Encounter (Signed)
Called pt and she is not sure why she needs to come back in and see the doctor.  She states she just saw the doctor and was told to stop taking the

## 2012-12-01 NOTE — Telephone Encounter (Signed)
Addendum: pepcid.  Pt doesn't know why she has to come in, she is doing well.  Shontae Rosiles, Darlyne Russian, CMA

## 2012-12-01 NOTE — Telephone Encounter (Signed)
OK, I got a letter from her insurance her nexium dose need to be cut back if doing well on Nexium,she can follow up with Gwendolyn Grant when he get's back.

## 2012-12-01 NOTE — Telephone Encounter (Signed)
Lisa [name]'s patient need to schedule f/u to reassess her GERD for potential Nexium dose reduction. Please call to schedule with me or resident in [place] team.

## 2012-12-02 ENCOUNTER — Telehealth: Payer: Self-pay | Admitting: Family Medicine

## 2012-12-02 NOTE — Telephone Encounter (Signed)
LMOM advising pt to sched F/U appt [id number]

## 2012-12-02 NOTE — Telephone Encounter (Signed)
Message copied by Barnie Alderman on Wed Dec 02, 2012  8:41 AM ------      Message from: Janit Pagan T      Created: Tue Dec 01, 2012  8:12 AM       Wal[facility]'s patient need to schedule f/u to reassess her GERD for potential Nexium dose reduction. Please call to schedule with me or resident in [facility] team. ------

## 2013-01-01 ENCOUNTER — Other Ambulatory Visit: Payer: Self-pay | Admitting: Family Medicine

## 2013-01-01 ENCOUNTER — Telehealth: Payer: Self-pay | Admitting: Family Medicine

## 2013-01-01 MED ORDER — LEVOTHYROXINE SODIUM 25 MCG PO TABS: ORAL_TABLET | ORAL | Status: DC

## 2013-01-01 NOTE — Telephone Encounter (Signed)
Called patient. Received fax from [facility]  regarding GERD treatment. Double therapy with ranitidine and nexium.  Asked patient if she needed double therapy. She said she did not. Does not need ranitidine.   D/c ranitidine, pharmacy called. She also request refill for synthroid, synthroid refilled via  EPIC

## 2013-02-15 ENCOUNTER — Other Ambulatory Visit: Payer: Self-pay | Admitting: Family Medicine

## 2013-03-02 ENCOUNTER — Encounter: Payer: Self-pay | Admitting: Family Medicine

## 2013-03-02 ENCOUNTER — Telehealth: Payer: Self-pay | Admitting: *Deleted

## 2013-03-02 ENCOUNTER — Ambulatory Visit (INDEPENDENT_AMBULATORY_CARE_PROVIDER_SITE_OTHER): Payer: Medicare Other | Admitting: Family Medicine

## 2013-03-02 VITALS — BP 150/81 | HR 69 | Temp 98.0°F | Ht 64.0 in | Wt 223.0 lb

## 2013-03-02 DIAGNOSIS — H3530 Unspecified macular degeneration: Secondary | ICD-10-CM | POA: Insufficient documentation

## 2013-03-02 DIAGNOSIS — J309 Allergic rhinitis, unspecified: Secondary | ICD-10-CM | POA: Diagnosis not present

## 2013-03-02 DIAGNOSIS — I10 Essential (primary) hypertension: Secondary | ICD-10-CM

## 2013-03-02 DIAGNOSIS — K219 Gastro-esophageal reflux disease without esophagitis: Secondary | ICD-10-CM

## 2013-03-02 DIAGNOSIS — Z23 Encounter for immunization: Secondary | ICD-10-CM

## 2013-03-02 DIAGNOSIS — E669 Obesity, unspecified: Secondary | ICD-10-CM

## 2013-03-02 DIAGNOSIS — F1010 Alcohol abuse, uncomplicated: Secondary | ICD-10-CM

## 2013-03-02 NOTE — Telephone Encounter (Signed)
Spoke with patient about this referral and she has been to an eye doctor previously and the doctor is no longer at [facility] eye center. Patient states that she will make her own appointment.Farrell Ours, CMA

## 2013-03-02 NOTE — Patient Instructions (Addendum)
It was good to see you today.  Make sure you go back to the Eye doctor when they call you for an appt.   Come back in 3-4 months.

## 2013-03-02 NOTE — Assessment & Plan Note (Addendum)
Notes this is worsening.  Has started to bump into objects.  Occasionally sees "squiggly lines" across her vision.  Has been to [facility] in past. Will refer back to them and recommend she be soon relatively soon.

## 2013-03-03 NOTE — Assessment & Plan Note (Signed)
Declined any counseling regarding this. She is still pre-contemplative

## 2013-03-03 NOTE — Assessment & Plan Note (Addendum)
Can attempt trial of Singulair. If symptoms recur she should restart. Did warn her also that may worsen her asthma although she is not needed as an inhaler for several months.

## 2013-03-03 NOTE — Progress Notes (Signed)
Patient ID: Lisa [name], female   DOB: May 01, 1956, 57 y.o.   MRN: [id number] Subjective:    [name] is a 57 y.o. female who presents to [facility] today for FU for chronic conditions:  1. Hypertension:  Long-term problem for this patient.  No adverse effects from medication.  Not checking it regularly.  States she is taking this regularly.  No HA, CP, dizziness, shortness of breath, palpitations, or LE swelling.   BP Readings from Last 3 Encounters:  03/02/13 150/81  10/13/12 140/90  03/06/12 148/98   2.  GERD:  Controlled currently.  Stopped Ranitidine, only on PPI.  Still with some moderate (4-6 beers) alcohol intake per day.  Denies any actual abdominal pain. History of GERD for past 10-15 years. No nausea vomiting  3.  Allergic rhinitis:  Has been on Singulair and Zyrtec for several years.  Strong personal history of allergies.  Usually worse in the fall.  Wants to know if she can stop Singulair.    4.  Obesity:  Awakens at night and snacks in the middle of the night.  States this is the main reason for her obesity. Eats meats and cheese in the middle of the night. Rest of diet during the day consists of simple carbohydrates  Prev health: Flu shot today.  Provided information regarding obtaining mammogram.  The following portions of the patient's history were reviewed and updated as appropriate: allergies, current medications, past medical history, family and social history, and problem list. Patient is a nonsmoker.    PMH reviewed.  Past Medical History  Diagnosis Date  . Alcoholism     Chronic -- not interested in cutting back.  Has cut back from 12 beers daily in 2014.    Marland Kitchen GERD (gastroesophageal reflux disease)   . Asthma   . Hypertension   . Allergy   . Hypothyroidism    Past Surgical History  Procedure Laterality Date  . Colonoscopy  2010    [facility] - Repeat in 10 years as normal findings     Medications reviewed. Current Outpatient Prescriptions  Medication Sig  Dispense Refill  . albuterol (VENTOLIN HFA) 108 (90 BASE) MCG/ACT inhaler Inhale 2 puffs into the lungs every 4 (four) hours as needed. For wheezing  1 Inhaler  1  . aspirin (ADULT ASPIRIN EC LOW STRENGTH) 81 MG EC tablet Take 81 mg by mouth daily.        . cetirizine (ZYRTEC) 10 MG tablet take 1 tablet by mouth once daily  90 tablet  2  . FLOVENT HFA 110 MCG/ACT inhaler inhale 2 puffs by mouth every morning and 2 every evening  12 g  3  . fluticasone (FLONASE) 50 MCG/ACT nasal spray instill 2 sprays into each nostril once daily  16 g  5  . hydrochlorothiazide (HYDRODIURIL) 25 MG tablet take 1 tablet by mouth every morning  90 tablet  2  . levothyroxine (SYNTHROID, LEVOTHROID) 25 MCG tablet take 1 tablet by mouth once daily  90 tablet  0  . metoprolol succinate (TOPROL-XL) 100 MG 24 hr tablet take 1 tablet by mouth once daily for high blood pressure  90 tablet  2  . montelukast (SINGULAIR) 10 MG tablet take 1 tablet by mouth once daily  30 tablet  4  . NEXIUM 40 MG capsule take 1 capsule by mouth once daily if needed for REFLUX  30 capsule  4   No current facility-administered medications for this visit.    ROS as above  otherwise neg.  No chest pain, palpitations, SOB, Fever, Chills, Abd pain, N/V/D.   Objective:   Physical Exam BP 150/81  Pulse 69  Temp(Src) 98 F (36.7 C) (Oral)  Ht 5\' 4"  (1.626 m)  Wt 223 lb (101.152 kg)  BMI 38.26 kg/m2 Gen:  Alert, cooperative patient who appears stated age in no acute distress.  Vital signs reviewed. When looking at me she has to look slightly laterally to be able to see my face secondary to her macular degeneration. Therefore it looks like she is not quite maintaining eye contact HEENT: EOMI,  MMM.  Upper and lower dentures in place.   Cardiac:  Regular rate and rhythm without murmur auscultated.  Good S1/S2. Pulm:  Clear to auscultation bilaterally with good air movement.  No wheezes or rales noted.   Abd:  Soft/nondistended/nontender.  Good  bowel sounds throughout all four quadrants.  No masses noted.  Exts: Non edematous BL  LE Psych: Pleasant and conversant.  No results found for this or any previous visit (from the past 72 hour(s)).

## 2013-03-03 NOTE — Assessment & Plan Note (Signed)
Not quite at goal. Patient currently declines any additional blood pressure medicines. We will need to readdress this at next visit.

## 2013-03-03 NOTE — Assessment & Plan Note (Signed)
Currently controlled. PPI only, no H2 blockers at this time

## 2013-03-03 NOTE — Assessment & Plan Note (Signed)
Discussed a tear eating habits including more fruits and vegetables

## 2013-03-12 ENCOUNTER — Other Ambulatory Visit: Payer: Self-pay

## 2013-03-12 DIAGNOSIS — Z1231 Encounter for screening mammogram for malignant neoplasm of breast: Secondary | ICD-10-CM

## 2013-03-24 ENCOUNTER — Other Ambulatory Visit: Payer: Self-pay | Admitting: Family Medicine

## 2013-03-29 ENCOUNTER — Other Ambulatory Visit: Payer: Self-pay | Admitting: Family Medicine

## 2013-04-02 ENCOUNTER — Ambulatory Visit: Payer: Medicare Other

## 2013-05-03 ENCOUNTER — Other Ambulatory Visit: Payer: Self-pay | Admitting: Family Medicine

## 2013-05-25 DIAGNOSIS — H3530 Unspecified macular degeneration: Secondary | ICD-10-CM | POA: Diagnosis not present

## 2013-06-28 DIAGNOSIS — H353190 Nonexudative age-related macular degeneration, unspecified eye, stage unspecified: Secondary | ICD-10-CM | POA: Diagnosis not present

## 2013-06-28 DIAGNOSIS — H3553 Other dystrophies primarily involving the sensory retina: Secondary | ICD-10-CM | POA: Diagnosis not present

## 2013-06-28 DIAGNOSIS — H2510 Age-related nuclear cataract, unspecified eye: Secondary | ICD-10-CM | POA: Diagnosis not present

## 2013-08-23 ENCOUNTER — Other Ambulatory Visit: Payer: Self-pay | Admitting: Family Medicine

## 2013-08-24 ENCOUNTER — Telehealth: Payer: Self-pay | Admitting: *Deleted

## 2013-08-24 NOTE — Telephone Encounter (Signed)
Prior Authorization received from [employer] for Esomeprazole. Formulary and PA form placed in provider box for completion. Derl Barrow, RN

## 2013-08-25 ENCOUNTER — Telehealth: Payer: Self-pay | Admitting: Family Medicine

## 2013-08-25 MED ORDER — PANTOPRAZOLE SODIUM 40 MG PO TBEC: 40.0000 mg | DELAYED_RELEASE_TABLET | Freq: Every day | ORAL | Status: DC

## 2013-08-25 NOTE — Telephone Encounter (Signed)
Switch to protonix which is preferred.

## 2013-08-31 NOTE — Telephone Encounter (Signed)
Placed a call to [employer] regarding coverage for Protonix.  Per the Pharmacist, Protonix, Prevacid, Prilosec and Aciphex is not covered by insurance.  Pt may have to purchase medication OTC per Pharmacist.  Lisa Barrow, RN

## 2013-08-31 NOTE — Telephone Encounter (Signed)
Advised patient that Protonix was sent in in replace of Nexium. She states her pharmacy has not received anything. Please call pharmacy.

## 2013-09-01 ENCOUNTER — Telehealth: Payer: Self-pay | Admitting: Family Medicine

## 2013-09-01 NOTE — Telephone Encounter (Signed)
Spoke with patient regarding medication issue.  Explained that Dr. Mingo Amber had not had the chance to review last correspondence for medication refill.   Said per pharmacy she may have to purchase OTC med until a rx can be sent that's approved by [employer] [facility].  Patient agreeable.

## 2013-09-06 NOTE — Telephone Encounter (Signed)
Relayed message,patient purchased some over OTC yesterday.[place]

## 2013-09-24 ENCOUNTER — Other Ambulatory Visit: Payer: Self-pay | Admitting: *Deleted

## 2013-09-27 MED ORDER — FLUTICASONE PROPIONATE HFA 110 MCG/ACT IN AERO: INHALATION_SPRAY | RESPIRATORY_TRACT | Status: DC

## 2013-11-16 ENCOUNTER — Other Ambulatory Visit: Payer: Self-pay | Admitting: Family Medicine

## 2013-12-17 ENCOUNTER — Encounter: Payer: Self-pay | Admitting: Family Medicine

## 2013-12-17 ENCOUNTER — Ambulatory Visit (INDEPENDENT_AMBULATORY_CARE_PROVIDER_SITE_OTHER): Payer: Medicare Other | Admitting: Family Medicine

## 2013-12-17 VITALS — BP 140/93 | HR 67 | Temp 97.6°F | Wt 225.0 lb

## 2013-12-17 DIAGNOSIS — R5381 Other malaise: Secondary | ICD-10-CM | POA: Diagnosis not present

## 2013-12-17 DIAGNOSIS — Z0000 Encounter for general adult medical examination without abnormal findings: Secondary | ICD-10-CM

## 2013-12-17 DIAGNOSIS — I10 Essential (primary) hypertension: Secondary | ICD-10-CM

## 2013-12-17 DIAGNOSIS — J45909 Unspecified asthma, uncomplicated: Secondary | ICD-10-CM | POA: Diagnosis not present

## 2013-12-17 DIAGNOSIS — E039 Hypothyroidism, unspecified: Secondary | ICD-10-CM

## 2013-12-17 DIAGNOSIS — H8110 Benign paroxysmal vertigo, unspecified ear: Secondary | ICD-10-CM

## 2013-12-17 DIAGNOSIS — H3530 Unspecified macular degeneration: Secondary | ICD-10-CM

## 2013-12-17 DIAGNOSIS — E785 Hyperlipidemia, unspecified: Secondary | ICD-10-CM

## 2013-12-17 LAB — COMPREHENSIVE METABOLIC PANEL
ALT: 52 U/L — ABNORMAL HIGH (ref 0–35)
AST: 46 U/L — ABNORMAL HIGH (ref 0–37)
Albumin: 4.2 g/dL (ref 3.5–5.2)
Alkaline Phosphatase: 85 U/L (ref 39–117)
BUN: 9 mg/dL (ref 6–23)
CO2: 26 mEq/L (ref 19–32)
Calcium: 9.8 mg/dL (ref 8.4–10.5)
Chloride: 101 mEq/L (ref 96–112)
Creat: 0.71 mg/dL (ref 0.50–1.10)
Glucose, Bld: 118 mg/dL — ABNORMAL HIGH (ref 70–99)
Potassium: 3.9 mEq/L (ref 3.5–5.3)
Sodium: 138 mEq/L (ref 135–145)
Total Bilirubin: 0.6 mg/dL (ref 0.2–1.2)
Total Protein: 6.8 g/dL (ref 6.0–8.3)

## 2013-12-17 LAB — CBC
HCT: 44.5 % (ref 36.0–46.0)
Hemoglobin: 16.2 g/dL — ABNORMAL HIGH (ref 12.0–15.0)
MCH: 32.4 pg (ref 26.0–34.0)
MCHC: 36.4 g/dL — ABNORMAL HIGH (ref 30.0–36.0)
MCV: 89 fL (ref 78.0–100.0)
Platelets: 302 10*3/uL (ref 150–400)
RBC: 5 MIL/uL (ref 3.87–5.11)
RDW: 13.2 % (ref 11.5–15.5)
WBC: 8.8 10*3/uL (ref 4.0–10.5)

## 2013-12-17 LAB — TSH: TSH: 3.762 u[IU]/mL (ref 0.350–4.500)

## 2013-12-17 LAB — LIPID PANEL
Cholesterol: 183 mg/dL (ref 0–200)
HDL: 55 mg/dL (ref >39)
LDL Cholesterol: 87 mg/dL (ref 0–99)
Total CHOL/HDL Ratio: 3.3 Ratio
Triglycerides: 204 mg/dL — ABNORMAL HIGH (ref <150)
VLDL: 41 mg/dL — ABNORMAL HIGH (ref 0–40)

## 2013-12-17 MED ORDER — METOPROLOL SUCCINATE ER 100 MG PO TB24: ORAL_TABLET | ORAL | Status: DC

## 2013-12-17 NOTE — Patient Instructions (Addendum)
Make sure you sign up for a mammogram.  Start the exercise program we talked about.  Stand 3-4 times a week during commercial breaks.  Call the ophthalmologist to see if there's anything else to do for your eyes.    I'm checking labs today to make sure that nothing else is making your dizzy.    It was good to see you today!

## 2013-12-17 NOTE — Progress Notes (Signed)
Subjective:    Lisa [name] is a 58 y.o. female who presents to [facility] today for several concerns:  1.  Vertigo symptoms:  Started about a month ago.  Based on head position/bending over.  First was about 2 months ago, recurred several weeks ago.  Lasted only a few moments.  Has history of vertigoin past, controlled with Meclizine.  No further episodes except for those two.    2.  No strength in legs:  Climbs steps and walks at work.  No exercise outside of work.  States this is worsening. Has always had to brace herself due to bad vision, but now bracing herself for strength.    3. Hypertension:  Long-term problem for this patient.  No adverse effects from medication.  Not checking it regularly.  No HA, CP, dizziness, shortness of breath, palpitations, or LE swelling.   BP Readings from Last 3 Encounters:  12/17/13 140/93  03/02/13 150/81  10/13/12 140/90   4.  Macular degeneration:  Followed by [facility], Dr. Arnoldo Morale.  Sent her to Dr. Zadie Rhine, told nothing she could do for this.  Feels that her eyesight is affecting her balance/walking.     Prev health:  Currently overdue for mammogram.  ROS as above per HPI, otherwise neg.  Pertinently, no chest pain, palpitations, SOB, Fever, Chills, Abd pain, N/V/D.   The following portions of the patient's history were reviewed and updated as appropriate: allergies, current medications, past medical history, family and social history, and problem list. Patient is a nonsmoker.    PMH reviewed.  Past Medical History  Diagnosis Date  . Alcoholism     Chronic -- not interested in cutting back.  Has cut back from 12 beers daily in 2014.    Marland Kitchen GERD (gastroesophageal reflux disease)   . Asthma   . Hypertension   . Allergy   . Hypothyroidism    Past Surgical History  Procedure Laterality Date  . Colonoscopy  2010    [facility] - Repeat in 10 years as normal findings     Medications reviewed. Current Outpatient Prescriptions   Medication Sig Dispense Refill  . albuterol (VENTOLIN HFA) 108 (90 BASE) MCG/ACT inhaler Inhale 2 puffs into the lungs every 4 (four) hours as needed. For wheezing  1 Inhaler  1  . aspirin (ADULT ASPIRIN EC LOW STRENGTH) 81 MG EC tablet Take 81 mg by mouth daily.        . cetirizine (ZYRTEC) 10 MG tablet take 1 tablet by mouth once daily  90 tablet  2  . fluticasone (FLONASE) 50 MCG/ACT nasal spray instill 2 sprays into each nostril once daily  16 g  5  . fluticasone (FLOVENT HFA) 110 MCG/ACT inhaler inhale 2 puffs by mouth every morning and 2 every evening  12 g  3  . hydrochlorothiazide (HYDRODIURIL) 25 MG tablet take 1 tablet by mouth every morning  90 tablet  2  . levothyroxine (SYNTHROID, LEVOTHROID) 25 MCG tablet take 1 tablet by mouth once daily  90 tablet  0  . metoprolol succinate (TOPROL-XL) 100 MG 24 hr tablet take 1 tablet by mouth once daily for high blood pressure  90 tablet  2  . montelukast (SINGULAIR) 10 MG tablet take 1 tablet by mouth once daily  30 tablet  4  . NEXIUM 40 MG capsule take 1 capsule by mouth once daily if needed for REFLUX  30 capsule  4  . pantoprazole (PROTONIX) 40 MG tablet Take 1 tablet (40  mg total) by mouth daily.  30 tablet  3   No current facility-administered medications for this visit.     Objective:   Physical Exam BP 140/93  Pulse 67  Temp(Src) 97.6 F (36.4 C) (Oral)  Wt 225 lb (102.059 kg) Gen:  Alert, cooperative patient who appears stated age in no acute distress.  Vital signs reviewed.  She looks just laterally to me, as is normal for patients with bad macular degeneration. HEENT: EOMI,  MMM.  Minimal lateral nystagmus.   Cardiac:  Regular rate and rhythm without murmur auscultated.  Good S1/S2. Pulm:  Clear to auscultation bilaterally with good air movement.  No wheezes or rales noted.   Exts: Non edematous BL  LE, warm and well perfused.  Neuro:  Strength 4/5 BL hip flexors.  5/5 hamstring and quad strength.  Normal gait  No  results found for this or any previous visit (from the past 72 hour(s)).

## 2013-12-17 NOTE — Assessment & Plan Note (Signed)
History of vertigo -- which was much more persistent.  This is more episodic.  Return to clinic/call if episodes return.

## 2013-12-17 NOTE — Assessment & Plan Note (Signed)
TSH today 

## 2013-12-17 NOTE — Assessment & Plan Note (Signed)
Recommended to call and see if there is ANYthing she can do to improve this.   Affects her balance and she fears falling.

## 2013-12-17 NOTE — Assessment & Plan Note (Signed)
Discussed past LDL's.  Patient very hesitant about starting statin. Wants to recheck today before initiating.

## 2013-12-17 NOTE — Assessment & Plan Note (Signed)
Has not needed rescue inhaler for months.

## 2013-12-17 NOTE — Assessment & Plan Note (Signed)
At goal, no changes

## 2013-12-17 NOTE — Assessment & Plan Note (Signed)
See instructions for hip flexor strengthening program. FU next appt to see how much this has helped.

## 2013-12-17 NOTE — Assessment & Plan Note (Signed)
Discussed need for mammogram.  Patient encouraged to schedule.

## 2014-01-20 ENCOUNTER — Other Ambulatory Visit: Payer: Self-pay | Admitting: *Deleted

## 2014-01-21 MED ORDER — MONTELUKAST SODIUM 10 MG PO TABS: ORAL_TABLET | ORAL | Status: DC

## 2014-01-31 ENCOUNTER — Other Ambulatory Visit: Payer: Self-pay | Admitting: Family Medicine

## 2014-02-09 ENCOUNTER — Other Ambulatory Visit: Payer: Self-pay | Admitting: Family Medicine

## 2014-02-28 DIAGNOSIS — Z23 Encounter for immunization: Secondary | ICD-10-CM | POA: Diagnosis not present

## 2014-03-28 ENCOUNTER — Other Ambulatory Visit: Payer: Self-pay | Admitting: Family Medicine

## 2014-04-20 ENCOUNTER — Other Ambulatory Visit: Payer: Self-pay | Admitting: Family Medicine

## 2014-04-20 DIAGNOSIS — Z1231 Encounter for screening mammogram for malignant neoplasm of breast: Secondary | ICD-10-CM

## 2014-04-22 ENCOUNTER — Ambulatory Visit (HOSPITAL_COMMUNITY)
Admission: RE | Admit: 2014-04-22 | Discharge: 2014-04-22 | Disposition: A | Discharge status: Home / Self Care | Payer: Medicare Other | Source: Ambulatory Visit | Attending: Family Medicine | Admitting: Family Medicine

## 2014-04-22 DIAGNOSIS — Z1231 Encounter for screening mammogram for malignant neoplasm of breast: Secondary | ICD-10-CM | POA: Diagnosis not present

## 2014-06-14 ENCOUNTER — Ambulatory Visit (INDEPENDENT_AMBULATORY_CARE_PROVIDER_SITE_OTHER): Payer: Medicare Other | Admitting: Family Medicine

## 2014-06-14 ENCOUNTER — Encounter: Payer: Self-pay | Admitting: Family Medicine

## 2014-06-14 VITALS — BP 144/96 | HR 64 | Temp 97.6°F | Ht 64.0 in | Wt 219.2 lb

## 2014-06-14 DIAGNOSIS — Z0000 Encounter for general adult medical examination without abnormal findings: Secondary | ICD-10-CM | POA: Diagnosis not present

## 2014-06-14 DIAGNOSIS — I10 Essential (primary) hypertension: Secondary | ICD-10-CM

## 2014-06-14 DIAGNOSIS — H3530 Unspecified macular degeneration: Secondary | ICD-10-CM | POA: Diagnosis not present

## 2014-06-14 DIAGNOSIS — J4540 Moderate persistent asthma, uncomplicated: Secondary | ICD-10-CM

## 2014-06-14 DIAGNOSIS — J3089 Other allergic rhinitis: Secondary | ICD-10-CM

## 2014-06-14 NOTE — Assessment & Plan Note (Signed)
Controlled with nexium.  Has failed other meds.  Will call when needs refills. Counseled on risks/benefits of longstanding PPI - she elects to continue due to discomfort of symptoms when she is without her PPI.

## 2014-06-14 NOTE — Assessment & Plan Note (Signed)
Needs pap smear.  She will schedule today.

## 2014-06-14 NOTE — Assessment & Plan Note (Signed)
No recent flares even with weather change.  She is doing well with her controller medications.

## 2014-06-14 NOTE — Progress Notes (Signed)
Subjective:    Lisa [name] is a 59 y.o. female who presents to [facility] today:  1.  GERD:  Longstanding. Controlled with Nexium.  Has tried and failed multiple other meds before, including prilosec.  Takes daily.  Has run out before and suffered rebound reflux.  No dysphagia or alarm symptoms.    2.  Hypertension:  Long-term problem for this patient.  No adverse effects from medication.  Not checking it regularly.  No HA, CP, dizziness, shortness of breath, palpitations, or LE swelling.   BP Readings from Last 3 Encounters:  06/14/14 144/96  12/17/13 140/93  03/02/13 150/81    3.  Chronic allergic rhinitis:  Controlled with Allegra and Flonase.  She is doing well with this currently.  Almost out of her allegra.  Last flare was before the weather turned.  NO fevers/chills.     Prev health:  Needs pap smear.    ROS as above per HPI, otherwise neg.  Pertinently, no chest pain, palpitations, SOB, Fever, Chills, Abd pain, N/V/D.   The following portions of the patient's history were reviewed and updated as appropriate: allergies, current medications, past medical history, family and social history, and problem list. Patient is a nonsmoker.    PMH reviewed.  Past Medical History  Diagnosis Date  . Alcoholism     Chronic -- not interested in cutting back.  Has cut back from 12 beers daily in 2014.    Marland Kitchen GERD (gastroesophageal reflux disease)   . Asthma   . Hypertension   . Allergy   . Hypothyroidism    Past Surgical History  Procedure Laterality Date  . Colonoscopy  2010    [facility] - Repeat in 10 years as normal findings     Medications reviewed. Current Outpatient Prescriptions  Medication Sig Dispense Refill  . albuterol (VENTOLIN HFA) 108 (90 BASE) MCG/ACT inhaler Inhale 2 puffs into the lungs every 4 (four) hours as needed. For wheezing 1 Inhaler 1  . aspirin (ADULT ASPIRIN EC LOW STRENGTH) 81 MG EC tablet Take 81 mg by mouth daily.      . cetirizine (ZYRTEC) 10 MG tablet take  1 tablet by mouth once daily 90 tablet 2  . fluticasone (FLONASE) 50 MCG/ACT nasal spray instill 2 sprays into each nostril once daily 16 g 5  . fluticasone (FLOVENT HFA) 110 MCG/ACT inhaler inhale 2 puffs by mouth every morning and 2 every evening 12 g 3  . hydrochlorothiazide (HYDRODIURIL) 25 MG tablet take 1 tablet by mouth every morning 90 tablet 2  . levothyroxine (SYNTHROID, LEVOTHROID) 25 MCG tablet take 1 tablet by mouth once daily 90 tablet 3  . metoprolol succinate (TOPROL-XL) 100 MG 24 hr tablet take 1 tablet by mouth once daily for high blood pressure 90 tablet 2  . montelukast (SINGULAIR) 10 MG tablet take 1 tablet by mouth once daily 30 tablet 4  . NEXIUM 40 MG capsule take 1 capsule by mouth once daily if needed for REFLUX. 30 capsule 4  . pantoprazole (PROTONIX) 40 MG tablet Take 1 tablet (40 mg total) by mouth daily. 30 tablet 3   No current facility-administered medications for this visit.     Objective:   Physical Exam BP 144/96 mmHg  Pulse 64  Temp(Src) 97.6 F (36.4 C) (Oral)  Ht 5\' 4"  (1.626 m)  Wt 219 lb 3.2 oz (99.428 kg)  BMI 37.61 kg/m2 Gen:  Alert, cooperative patient who appears stated age in no acute distress.  Vital signs reviewed.  HEENT: EOMI,  MMM.  Nasal mucosa normal appearing.  Ears clear BL.  Cardiac:  Regular rate and rhythm without murmur auscultated.  Good S1/S2. Pulm:  Clear to auscultation bilaterally with good air movement.  No wheezes or rales noted.   Abd:  Soft/nondistended/nontender.  Good bowel sounds throughout all four quadrants.  No masses noted.  Exts: Non edematous BL  LE, warm and well perfused.   No results found for this or any previous visit (from the past 72 hour(s)).

## 2014-06-14 NOTE — Assessment & Plan Note (Signed)
Will call when needs Allegra refills.   Currently controlled.

## 2014-06-14 NOTE — Assessment & Plan Note (Signed)
Just barely above goal.  Has been at goal at prior OV's.   No change to medications today.  She is actively working on losing weight, which should help this as well.

## 2014-06-14 NOTE — Patient Instructions (Signed)
Good job on using weight!  Things look good to me today.  Let me know when you need refills.    I'll see you back in 3-6 months.

## 2014-06-14 NOTE — Assessment & Plan Note (Signed)
Vision poor.  Told by ophtho there's not much to do for this for her.  She has FU with them later this year.

## 2014-06-23 ENCOUNTER — Other Ambulatory Visit: Payer: Self-pay | Admitting: Family Medicine

## 2014-07-11 ENCOUNTER — Other Ambulatory Visit: Payer: Self-pay | Admitting: Family Medicine

## 2014-08-23 ENCOUNTER — Other Ambulatory Visit: Payer: Self-pay | Admitting: Family Medicine

## 2014-09-19 ENCOUNTER — Other Ambulatory Visit: Payer: Self-pay | Admitting: Family Medicine

## 2014-10-05 ENCOUNTER — Other Ambulatory Visit: Payer: Self-pay | Admitting: Family Medicine

## 2014-10-05 NOTE — Telephone Encounter (Signed)
Will forward to MD.  This is the first request received for medication. Jazmin Hartsell,CMA

## 2014-10-05 NOTE — Telephone Encounter (Signed)
Pt called to check the status of her request for a refill on Flovent . jw

## 2014-10-06 MED ORDER — FLUTICASONE PROPIONATE HFA 110 MCG/ACT IN AERO: INHALATION_SPRAY | RESPIRATORY_TRACT | Status: DC

## 2014-10-26 ENCOUNTER — Other Ambulatory Visit: Payer: Self-pay | Admitting: Family Medicine

## 2014-10-26 MED ORDER — HYDROCHLOROTHIAZIDE 25 MG PO TABS: 25.0000 mg | ORAL_TABLET | Freq: Every morning | ORAL | Status: DC

## 2014-10-26 NOTE — Telephone Encounter (Signed)
Pt called and needs a refill on her hydrochlorothiazide. jw

## 2014-11-17 ENCOUNTER — Other Ambulatory Visit: Payer: Self-pay | Admitting: Family Medicine

## 2015-02-07 ENCOUNTER — Ambulatory Visit (INDEPENDENT_AMBULATORY_CARE_PROVIDER_SITE_OTHER): Payer: Medicare Other | Admitting: Family Medicine

## 2015-02-07 ENCOUNTER — Encounter: Payer: Self-pay | Admitting: Family Medicine

## 2015-02-07 ENCOUNTER — Other Ambulatory Visit (HOSPITAL_COMMUNITY)
Admission: RE | Admit: 2015-02-07 | Discharge: 2015-02-07 | Disposition: A | Discharge status: Home / Self Care | Payer: Medicare Other | Source: Ambulatory Visit | Attending: Family Medicine | Admitting: Family Medicine

## 2015-02-07 VITALS — BP 164/101 | HR 73 | Temp 97.8°F | Ht 64.0 in | Wt 222.2 lb

## 2015-02-07 DIAGNOSIS — Z23 Encounter for immunization: Secondary | ICD-10-CM

## 2015-02-07 DIAGNOSIS — E039 Hypothyroidism, unspecified: Secondary | ICD-10-CM

## 2015-02-07 DIAGNOSIS — K219 Gastro-esophageal reflux disease without esophagitis: Secondary | ICD-10-CM

## 2015-02-07 DIAGNOSIS — Z124 Encounter for screening for malignant neoplasm of cervix: Secondary | ICD-10-CM | POA: Diagnosis not present

## 2015-02-07 DIAGNOSIS — J3089 Other allergic rhinitis: Secondary | ICD-10-CM

## 2015-02-07 DIAGNOSIS — M10071 Idiopathic gout, right ankle and foot: Secondary | ICD-10-CM

## 2015-02-07 DIAGNOSIS — F1010 Alcohol abuse, uncomplicated: Secondary | ICD-10-CM | POA: Diagnosis not present

## 2015-02-07 DIAGNOSIS — M2011 Hallux valgus (acquired), right foot: Secondary | ICD-10-CM | POA: Diagnosis not present

## 2015-02-07 DIAGNOSIS — J4540 Moderate persistent asthma, uncomplicated: Secondary | ICD-10-CM

## 2015-02-07 DIAGNOSIS — Z0000 Encounter for general adult medical examination without abnormal findings: Secondary | ICD-10-CM | POA: Diagnosis not present

## 2015-02-07 DIAGNOSIS — Z1151 Encounter for screening for human papillomavirus (HPV): Secondary | ICD-10-CM | POA: Diagnosis not present

## 2015-02-07 DIAGNOSIS — I10 Essential (primary) hypertension: Secondary | ICD-10-CM | POA: Diagnosis not present

## 2015-02-07 DIAGNOSIS — M109 Gout, unspecified: Secondary | ICD-10-CM

## 2015-02-07 DIAGNOSIS — M21619 Bunion of unspecified foot: Secondary | ICD-10-CM | POA: Insufficient documentation

## 2015-02-07 LAB — CBC
HCT: 49.1 % — ABNORMAL HIGH (ref 36.0–46.0)
Hemoglobin: 16.8 g/dL — ABNORMAL HIGH (ref 12.0–15.0)
MCH: 31.5 pg (ref 26.0–34.0)
MCHC: 34.2 g/dL (ref 30.0–36.0)
MCV: 92.1 fL (ref 78.0–100.0)
MPV: 10.8 fL (ref 8.6–12.4)
Platelets: 316 K/uL (ref 150–400)
RBC: 5.33 MIL/uL — ABNORMAL HIGH (ref 3.87–5.11)
RDW: 13.3 % (ref 11.5–15.5)
WBC: 9.1 K/uL (ref 4.0–10.5)

## 2015-02-07 MED ORDER — METOPROLOL SUCCINATE ER 100 MG PO TB24: 100.0000 mg | ORAL_TABLET | Freq: Every day | ORAL | Status: DC

## 2015-02-07 MED ORDER — COLCHICINE 0.6 MG PO TABS: 0.6000 mg | ORAL_TABLET | Freq: Every day | ORAL | Status: DC | PRN

## 2015-02-07 MED ORDER — ESOMEPRAZOLE MAGNESIUM 40 MG PO CPDR: 40.0000 mg | DELAYED_RELEASE_CAPSULE | Freq: Every day | ORAL | Status: DC

## 2015-02-07 MED ORDER — LEVOTHYROXINE SODIUM 25 MCG PO TABS: 25.0000 ug | ORAL_TABLET | Freq: Every day | ORAL | Status: DC

## 2015-02-07 MED ORDER — HYDROCHLOROTHIAZIDE 25 MG PO TABS: 25.0000 mg | ORAL_TABLET | Freq: Every morning | ORAL | Status: DC

## 2015-02-07 MED ORDER — MONTELUKAST SODIUM 10 MG PO TABS: 10.0000 mg | ORAL_TABLET | Freq: Every day | ORAL | Status: DC

## 2015-02-07 MED ORDER — ALBUTEROL SULFATE HFA 108 (90 BASE) MCG/ACT IN AERS: 2.0000 | INHALATION_SPRAY | RESPIRATORY_TRACT | Status: DC | PRN

## 2015-02-07 MED ORDER — CETIRIZINE HCL 10 MG PO TABS: 10.0000 mg | ORAL_TABLET | Freq: Every day | ORAL | Status: DC

## 2015-02-07 NOTE — Assessment & Plan Note (Signed)
Refill for Synthroid.  Check TSH

## 2015-02-07 NOTE — Progress Notes (Signed)
Subjective:    Lisa [name] is a 59 y.o. female who presents to [facility] today for several issues:  1. Hypertension:  Long-term problem for this patient.  No adverse effects from medication.  Not checking it regularly.  No HA, CP, dizziness, shortness of breath, palpitations, or LE swelling.  Hasn't taken her metoprolol for past several days.  Has been taking HCTZ as prescribed.    BP Readings from Last 3 Encounters:  02/07/15 164/101  06/14/14 144/96  12/17/13 140/93    2.  Gout attack last week:  Had to use some of her husband's colchicine.  Still "tender."  Can bear weight but painful.  Warmth and redness are improved.  Still drinks 2-5 beers daily.   3.  GERD:  Under control with Nexium.  Knows triggers.  Hasn't had any recently.  No chest pain or dysphagia.  No weight loss. Last Nexium pill today.    Prev health:  Currently overdue for Pap smear.  ROS as above per HPI, otherwise neg.  Pertinently, no chest pain, palpitations, SOB, Fever, Chills, Abd pain, N/V/D.   The following portions of the patient's history were reviewed and updated as appropriate: allergies, current medications, past medical history, family and social history, and problem list. Patient is a nonsmoker.    PMH reviewed.  Past Medical History  Diagnosis Date  . Alcoholism     Chronic -- not interested in cutting back.  Has cut back from 12 beers daily in 2014.    Marland Kitchen GERD (gastroesophageal reflux disease)   . Asthma   . Hypertension   . Allergy   . Hypothyroidism    Past Surgical History  Procedure Laterality Date  . Colonoscopy  2010    [facility] - Repeat in 10 years as normal findings     Medications reviewed. Current Outpatient Prescriptions  Medication Sig Dispense Refill  . albuterol (VENTOLIN HFA) 108 (90 BASE) MCG/ACT inhaler Inhale 2 puffs into the lungs every 4 (four) hours as needed. For wheezing 1 Inhaler 1  . aspirin (ADULT ASPIRIN EC LOW STRENGTH) 81 MG EC tablet Take 81 mg by mouth daily.       . cetirizine (ZYRTEC) 10 MG tablet take 1 tablet by mouth once daily 90 tablet 2  . fluticasone (FLONASE) 50 MCG/ACT nasal spray instill 2 sprays into each nostril once daily 16 g 5  . fluticasone (FLOVENT HFA) 110 MCG/ACT inhaler inhale 2 puffs by mouth every morning and 2 every evening 12 g 3  . hydrochlorothiazide (HYDRODIURIL) 25 MG tablet Take 1 tablet (25 mg total) by mouth every morning. 90 tablet 2  . levothyroxine (SYNTHROID, LEVOTHROID) 25 MCG tablet take 1 tablet by mouth once daily 90 tablet 3  . metoprolol succinate (TOPROL-XL) 100 MG 24 hr tablet take 1 tablet by mouth once daily for high blood pressure 90 tablet 2  . montelukast (SINGULAIR) 10 MG tablet take 1 tablet by mouth once daily 30 tablet 4  . NEXIUM 40 MG capsule take 1 capsule by mouth once daily if needed 30 capsule 6  . pantoprazole (PROTONIX) 40 MG tablet Take 1 tablet (40 mg total) by mouth daily. 30 tablet 3   No current facility-administered medications for this visit.     Objective:   Physical Exam BP 164/101 mmHg  Pulse 73  Temp(Src) 97.8 F (36.6 C) (Oral)  Ht 5\' 4"  (1.626 m)  Wt 222 lb 3.2 oz (100.789 kg)  BMI 38.12 kg/m2 Gen:  Alert, cooperative patient who  appears stated age in no acute distress.  Vital signs reviewed. HEENT: EOMI,  MMM.  Sideways glance consistent with macular degeneration. TM's good BL. Neck:  No goiter Cardiac:  Regular rate and rhythm without murmur auscultated.  Good S1/S2. Pulm:  Clear to auscultation bilaterally with good air movement.  No wheezes or rales noted.   Abd:  Soft/nondistended/nontender.   GYN:  External genitalia within normal limits.  Vaginal mucosa pink, moist, slightly atrophied rugae.  Nonfriable cervix without lesions, no discharge or bleeding noted on speculum exam.  Bimanual exam revealed normal, nongravid uterus.  No cervical motion tenderness. No adnexal masses bilaterally.  Pap performed.   Exts: Non edematous BL  LE, warm and well perfused.  MSK:  Right great toe with some erythema and mild TTP.  Also bunion noted here.   No results found for this or any previous visit (from the past 72 hour(s)).

## 2015-02-07 NOTE — Assessment & Plan Note (Signed)
Mentions this is worse with heat.  Now out of her rescue inhaler.  Taking daily Flovent.   Refill provided today.

## 2015-02-07 NOTE — Assessment & Plan Note (Signed)
Again today.  Counseled on continued cutting back on alcohol.  Measure uric acid. Colchicine to treat.   FU next visit.

## 2015-02-07 NOTE — Assessment & Plan Note (Signed)
Refill for Nexium today.  Will need prior auth it looks like.  Omeprazole to tide her over.  Controlled.

## 2015-02-07 NOTE — Assessment & Plan Note (Signed)
Pap, flu, Tdap today.

## 2015-02-07 NOTE — Assessment & Plan Note (Signed)
No longer taking Fluticasone.  Started causing headache.  Will retry next spring.

## 2015-02-07 NOTE — Patient Instructions (Addendum)
It was good to see you again today.    It won't work as well as the Nexium, as you know, but over the counter Omeprazole can tide you over until the prior auth goes through.  Take 2 pills (40 mg).   I have refilled all the rest of your medicines.    Blood work today.   Take the Colchicine as needed for pain relief for gout.

## 2015-02-07 NOTE — Assessment & Plan Note (Signed)
Counseled to cut back.

## 2015-02-08 LAB — COMPREHENSIVE METABOLIC PANEL
ALT: 43 U/L — ABNORMAL HIGH (ref 6–29)
AST: 36 U/L — ABNORMAL HIGH (ref 10–35)
Albumin: 4.4 g/dL (ref 3.6–5.1)
Alkaline Phosphatase: 79 U/L (ref 33–130)
BUN: 9 mg/dL (ref 7–25)
CO2: 28 mmol/L (ref 20–31)
Calcium: 10.1 mg/dL (ref 8.6–10.4)
Chloride: 97 mmol/L — ABNORMAL LOW (ref 98–110)
Creat: 0.72 mg/dL (ref 0.50–1.05)
Glucose, Bld: 118 mg/dL — ABNORMAL HIGH (ref 65–99)
Potassium: 3.8 mmol/L (ref 3.5–5.3)
Sodium: 139 mmol/L (ref 135–146)
Total Bilirubin: 0.9 mg/dL (ref 0.2–1.2)
Total Protein: 6.9 g/dL (ref 6.1–8.1)

## 2015-02-08 LAB — CYTOLOGY - PAP

## 2015-02-08 LAB — LIPID PANEL
Cholesterol: 210 mg/dL — ABNORMAL HIGH (ref 125–200)
HDL: 54 mg/dL (ref >=46)
LDL Cholesterol: 107 mg/dL (ref <130)
Total CHOL/HDL Ratio: 3.9 Ratio (ref <=5.0)
Triglycerides: 247 mg/dL — ABNORMAL HIGH (ref <150)
VLDL: 49 mg/dL — ABNORMAL HIGH (ref <30)

## 2015-02-08 LAB — URIC ACID: Uric Acid, Serum: 7.8 mg/dL — ABNORMAL HIGH (ref 2.4–7.0)

## 2015-02-08 LAB — TSH: TSH: 3.246 u[IU]/mL (ref 0.350–4.500)

## 2015-02-09 ENCOUNTER — Encounter: Payer: Self-pay | Admitting: Family Medicine

## 2015-02-09 NOTE — Addendum Note (Signed)
Addended by: Londell Moh T on: 02/09/2015 08:49 AM   Modules accepted: Orders

## 2015-03-24 ENCOUNTER — Ambulatory Visit (INDEPENDENT_AMBULATORY_CARE_PROVIDER_SITE_OTHER): Payer: Medicare Other | Admitting: Internal Medicine

## 2015-03-24 ENCOUNTER — Encounter: Payer: Self-pay | Admitting: Internal Medicine

## 2015-03-24 VITALS — Ht 64.0 in | Wt 221.7 lb

## 2015-03-24 DIAGNOSIS — L03115 Cellulitis of right lower limb: Secondary | ICD-10-CM | POA: Diagnosis present

## 2015-03-24 MED ORDER — PREDNISONE 20 MG PO TABS: 20.0000 mg | ORAL_TABLET | Freq: Every day | ORAL | Status: DC

## 2015-03-24 MED ORDER — CEPHALEXIN 500 MG PO CAPS: 500.0000 mg | ORAL_CAPSULE | Freq: Two times a day (BID) | ORAL | Status: DC

## 2015-03-24 NOTE — Progress Notes (Signed)
   Subjective:    Patient ID: Lisa [name], female    DOB: 1956-01-31, 59 y.o.   MRN: [id number]  HPI  Lisa [name] is a 59 yo F with PMH of HTN presenting for a bug bite.   Lisa [name] reports that she received some mosquito bites on her R lower leg two days ago. Initially the bites were very small, but they have since grown in size. She scratched them a lot, as they were very itchy. She applied Neosporin and cortisone cream to the bites, with no symptomatic relief. Yesterday, she noticed a large blister had formed around one of the bites. The blister has since ruptured, and now there is crusting and drainage at that site. She denies fevers, chills, nausea or vomiting. She was working in her backyard with her husband when she received the bites. She did not physically see the mosquito bite her, but does not think it was any other type of bug. She says her reactions to mosquito bites are usually more severe than other people's, but normally resolve after putting rubbing alcohol on the site.  She is not currently immunocompromised, and reports that she has only HTN well-controlled with two medications.   Review of Systems See HPI    Objective:   Physical Exam  Constitutional: She is oriented to person, place, and time. She appears well-developed and well-nourished. No distress.  HENT:  Head: Normocephalic and atraumatic.  Musculoskeletal:  Significant area of erythema (18 cm x 11 cm at largest point) with central lesion on R calf. Non-tender, indurated, and warm to touch. Lesion has crusting and some serosanguinous drainage. Four other small lesions with surrounding erythema present on R calf as well.   Neurological: She is alert and oriented to person, place, and time.  Skin: She is not diaphoretic.  Psychiatric: She has a normal mood and affect. Her behavior is normal.      Assessment & Plan:  Cellulitis of leg, right Worsening. Patient currently does not have systemic signs of infection,  but given the size of the erythema and speed at which it has progressed, will treat with oral rather than topical antibiotics.  - Keflex 500 mg BID for 7 days - Prednisone 20 mg daily for 3 days for inflammation - Follow-up in three days for reevaluation  - Gave strict return precautions   Adin Hector, MD PGY-1 [facility]

## 2015-03-24 NOTE — Patient Instructions (Signed)
It was nice meeting you and your husband today, Lisa [name]!  To treat your skin infection, I would like you to take an antibiotic (Keflex) by mouth two times a day for 7 days. You can also take the steroid pill (prednisone) once a day for three days to help with the inflammation and itching.   I would like to see you back early next week to make sure the infection is improving.   If you develop fevers or chills, please call our office or go to the emergency room. Also please call our office if the redness continues to spread despite taking antibiotics.   If you have any questions, please feel free to call the clinic.   Be well,  Dr. Avon Gully

## 2015-03-24 NOTE — Assessment & Plan Note (Signed)
Worsening. Patient currently does not have systemic signs of infection, but given the size of the erythema and speed at which it has progressed, will treat with oral rather than topical antibiotics.  - Keflex 500 mg BID for 7 days - Prednisone 20 mg daily for 3 days for inflammation - Follow-up in three days for reevaluation  - Gave strict return precautions

## 2015-03-28 ENCOUNTER — Ambulatory Visit: Payer: Medicare Other | Admitting: Internal Medicine

## 2015-03-29 ENCOUNTER — Encounter: Payer: Self-pay | Admitting: Internal Medicine

## 2015-03-29 ENCOUNTER — Ambulatory Visit (INDEPENDENT_AMBULATORY_CARE_PROVIDER_SITE_OTHER): Payer: Medicare Other | Admitting: Internal Medicine

## 2015-03-29 VITALS — BP 128/88 | HR 76 | Temp 97.5°F | Ht 64.0 in | Wt 221.0 lb

## 2015-03-29 DIAGNOSIS — L03115 Cellulitis of right lower limb: Secondary | ICD-10-CM

## 2015-03-29 NOTE — Patient Instructions (Signed)
It was nice seeing you and your husband today, Lisa [name]!   I'm so glad to see that your skin infection has improved. Please continue to take the antibiotics for the next two days. Since the infection is so much better, there is no need to follow-up again.   Please feel free to call the clinic with any questions or concerns.   Be well,  Dr. Avon Gully

## 2015-03-29 NOTE — Progress Notes (Signed)
   Subjective:    Patient ID: Lisa [name], female    DOB: 1955/08/26, 59 y.o.   MRN: [id number]  HPI  Lisa [name] is a 59 yo F presenting for cellulitis follow-up.   Cellulitis Lisa [name] presented 5 days ago with an infected mosquito bite. She had significant erythema and induration surrounding the bite, with serosanguinous discharge. She was started on Keflex 500 mg PO for 7 days, and prednisone 20 mg for 3 days. She has completed the prednisone course and is on day #5 of Keflex. Her symptoms quickly improved, and she has no complaints today. Denies fevers, chills, itching, N/V, new rashes or any new bites.    Review of Systems See HPI.    Objective:   Physical Exam  Constitutional: She appears well-developed and well-nourished. No distress.  HENT:  Head: Normocephalic and atraumatic.  Skin:  Healing insect bite covered by scab on R calf with no drainage or discharge. No erythema or induration surrounding lesion. Four smaller lesions with small scabs in various stages on healing on same leg. No erythema or induration surrounding these lesions.   Psychiatric: She has a normal mood and affect. Her behavior is normal.      Assessment & Plan:  Cellulitis of leg, right Improved after 5 days of Keflex and prednisone.  - Continue Keflex for two more days to complete 7 day course    Adin Hector, MD PGY-1 [facility]

## 2015-03-29 NOTE — Assessment & Plan Note (Signed)
Improved after 5 days of Keflex and prednisone.  - Continue Keflex for two more days to complete 7 day course

## 2015-04-03 ENCOUNTER — Telehealth: Payer: Self-pay | Admitting: Family Medicine

## 2015-04-03 ENCOUNTER — Ambulatory Visit (HOSPITAL_COMMUNITY)
Admission: RE | Admit: 2015-04-03 | Discharge: 2015-04-03 | Disposition: A | Discharge status: Home / Self Care | Payer: Medicare Other | Source: Ambulatory Visit | Attending: Family Medicine | Admitting: Family Medicine

## 2015-04-03 ENCOUNTER — Ambulatory Visit (INDEPENDENT_AMBULATORY_CARE_PROVIDER_SITE_OTHER): Payer: Medicare Other | Admitting: Family Medicine

## 2015-04-03 VITALS — BP 141/84 | HR 79 | Temp 97.5°F | Wt 221.1 lb

## 2015-04-03 DIAGNOSIS — M79671 Pain in right foot: Secondary | ICD-10-CM

## 2015-04-03 DIAGNOSIS — M7989 Other specified soft tissue disorders: Secondary | ICD-10-CM | POA: Diagnosis not present

## 2015-04-03 MED ORDER — IBUPROFEN 600 MG PO TABS: 600.0000 mg | ORAL_TABLET | Freq: Three times a day (TID) | ORAL | Status: DC | PRN

## 2015-04-03 NOTE — Assessment & Plan Note (Signed)
Eversion injury with most likely grade 1 or grade 2 ankle sprain. Having tenderness to palpation over distal medial malleolus - x-rays ordered - Pending x-ray results will determine plan of treatment

## 2015-04-03 NOTE — Patient Instructions (Signed)
Thank you for coming in,   Please go get the x-rays now and I will call you with the results.    Sign up for My Chart to have easy access to your labs results, and communication with your Primary care physician   Please feel free to call with any questions or concerns at any time, at [phone]. --Dr. Raeford Razor

## 2015-04-03 NOTE — Progress Notes (Signed)
   Subjective:    Patient ID: Lisa [name], female    DOB: [birth year], 59 y.o.   MRN: [id number]  Seen for Same day visit for   CC: right ankle pain   She was walking out of her friends house and had an eversion injury. She heard a "pop" when it occurred.  Location: right ankle  Pain started: last Wednesday Pain is: feels like a cramp  Severity: 7-8/10 Medications tried: ibuprofen  Recent trauma: yes Similar pain previously: no  Symptoms Redness: no Swelling: yes Fever: no Weakness: no Weight loss: no Rash: no   Review of Systems   See HPI for ROS. Objective:  BP 141/84 mmHg  Pulse 79  Temp(Src) 97.5 F (36.4 C) (Oral)  Wt 221 lb 1.6 oz (100.29 kg)  General: NAD Foot Exam:  Laterality: right Appearance: ecchymosis on the anterior lateral aspect of the right foot Gait: limp Edema: mild effusion noted throughout the foot Tenderness: tenderness to palpation on the distal aspect of the medial malleolus as well as a lateral malleolus. Also having tenderness to palpation along the shaft of the fifth metatarsal. No tenderness palpation of the navicular bone or other metatarsals Range of Motion: normal active Laxity: none Neurovascularly intact: normal Maneuvers: Anterior drawer: negative Strength:  Dorsiflexion: 5/5 Plantarflexion: 5/5 Inversion: 5/5 Eversion: 5/5    Assessment & Plan:   Right foot pain Eversion injury with most likely grade 1 or grade 2 ankle sprain. Having tenderness to palpation over distal medial malleolus - x-rays ordered - Pending x-ray results will determine plan of treatment

## 2015-04-03 NOTE — Telephone Encounter (Signed)
Informed of results.   Rosemarie Ax, MD PGY-3, [facility] 04/03/2015, 1:57 PM

## 2015-08-01 ENCOUNTER — Ambulatory Visit (INDEPENDENT_AMBULATORY_CARE_PROVIDER_SITE_OTHER): Payer: Medicare Other | Admitting: Family Medicine

## 2015-08-01 ENCOUNTER — Encounter: Payer: Self-pay | Admitting: Family Medicine

## 2015-08-01 VITALS — BP 144/77 | HR 70 | Temp 97.9°F | Ht 64.0 in | Wt 224.4 lb

## 2015-08-01 DIAGNOSIS — H9313 Tinnitus, bilateral: Secondary | ICD-10-CM | POA: Diagnosis not present

## 2015-08-01 DIAGNOSIS — E039 Hypothyroidism, unspecified: Secondary | ICD-10-CM

## 2015-08-01 DIAGNOSIS — I10 Essential (primary) hypertension: Secondary | ICD-10-CM

## 2015-08-01 DIAGNOSIS — M109 Gout, unspecified: Secondary | ICD-10-CM

## 2015-08-01 DIAGNOSIS — H9319 Tinnitus, unspecified ear: Secondary | ICD-10-CM | POA: Insufficient documentation

## 2015-08-01 DIAGNOSIS — H8110 Benign paroxysmal vertigo, unspecified ear: Secondary | ICD-10-CM | POA: Diagnosis not present

## 2015-08-01 DIAGNOSIS — M10071 Idiopathic gout, right ankle and foot: Secondary | ICD-10-CM

## 2015-08-01 MED ORDER — LOSARTAN POTASSIUM 50 MG PO TABS
50.0000 mg | ORAL_TABLET | Freq: Every day | ORAL | Status: DC
Start: 2015-08-01 — End: 2015-10-03

## 2015-08-01 NOTE — Assessment & Plan Note (Signed)
Controlled.  

## 2015-08-01 NOTE — Assessment & Plan Note (Signed)
Check next visit 

## 2015-08-01 NOTE — Assessment & Plan Note (Signed)
Recurrent. Switch to Cozaar to lower uric acid.  FU in 2 weeks to recheck creatinine.

## 2015-08-01 NOTE — Patient Instructions (Signed)
Stop taking the hydrochlorothiazide.    Start the Cozaar tomorrow.    It was good to see you again today.

## 2015-08-01 NOTE — Progress Notes (Signed)
Subjective:    Lisa [name] is a 60 y.o. female who presents to [facility] today for :  1.  Hypertension:  Long-term problem for this patient.  No adverse effects from medication.  Not checking it regularly.  No HA, CP, dizziness, shortness of breath, palpitations, or LE swelling.   BP Readings from Last 3 Encounters:  08/01/15 144/77  04/03/15 141/84  03/29/15 128/88   2.  Gout:  Has had 2 attacks in past several months. Worse in feet/podagra.  Relieved with prednisone when she has this available, otherwise colchicine.  No fevers or chills.    3. Tinnitus:  Started several months ago.  Intermittent.  Worse at night.  No ear pain.  No decreased hearing.  No loss of balance.  Not taking extra strength ASA.      ROS as above per HPI, otherwise neg.    The following portions of the patient's history were reviewed and updated as appropriate: allergies, current medications, past medical history, family and social history, and problem list. Patient is a nonsmoker.    PMH reviewed.  Past Medical History  Diagnosis Date  . Alcoholism ([place])     Chronic -- not interested in cutting back.  Has cut back from 12 beers daily in 2014.    Marland Kitchen GERD (gastroesophageal reflux disease)   . Asthma   . Hypertension   . Allergy   . Hypothyroidism    Past Surgical History  Procedure Laterality Date  . Colonoscopy  2010    [facility] - Repeat in 10 years as normal findings     Medications reviewed. Current Outpatient Prescriptions  Medication Sig Dispense Refill  . albuterol (VENTOLIN HFA) 108 (90 BASE) MCG/ACT inhaler Inhale 2 puffs into the lungs every 4 (four) hours as needed. For wheezing 1 Inhaler 1  . aspirin (ADULT ASPIRIN EC LOW STRENGTH) 81 MG EC tablet Take 81 mg by mouth daily.      . cetirizine (ZYRTEC) 10 MG tablet Take 1 tablet (10 mg total) by mouth daily. 90 tablet 2  . colchicine (COLCRYS) 0.6 MG tablet Take 1 tablet (0.6 mg total) by mouth daily as needed. 25 tablet 1  . esomeprazole  (NEXIUM) 40 MG capsule Take 1 capsule (40 mg total) by mouth daily at 12 noon. 30 capsule 6  . fluticasone (FLOVENT HFA) 110 MCG/ACT inhaler inhale 2 puffs by mouth every morning and 2 every evening 12 g 3  . hydrochlorothiazide (HYDRODIURIL) 25 MG tablet Take 1 tablet (25 mg total) by mouth every morning. 90 tablet 2  . ibuprofen (ADVIL,MOTRIN) 600 MG tablet Take 1 tablet (600 mg total) by mouth every 8 (eight) hours as needed. 20 tablet 0  . levothyroxine (SYNTHROID, LEVOTHROID) 25 MCG tablet Take 1 tablet (25 mcg total) by mouth daily. 90 tablet 3  . metoprolol succinate (TOPROL-XL) 100 MG 24 hr tablet Take 1 tablet (100 mg total) by mouth daily. for high blood pressure 90 tablet 2  . montelukast (SINGULAIR) 10 MG tablet Take 1 tablet (10 mg total) by mouth daily. 30 tablet 4  . cephALEXin (KEFLEX) 500 MG capsule Take 1 capsule (500 mg total) by mouth 2 (two) times daily. (Patient not taking: Reported on 08/01/2015) 14 capsule 0  . predniSONE (DELTASONE) 20 MG tablet Take 1 tablet (20 mg total) by mouth daily with breakfast. (Patient not taking: Reported on 08/01/2015) 3 tablet 0   No current facility-administered medications for this visit.     Objective:   Physical Exam  BP 144/77 mmHg  Pulse 70  Temp(Src) 97.9 F (36.6 C) (Oral)  Ht 5\' 4"  (1.626 m)  Wt 224 lb 6.4 oz (101.787 kg)  BMI 38.50 kg/m2 Gen:  Alert, cooperative patient who appears stated age in no acute distress.  Vital signs reviewed. HEENT: EOMI,  MMM Cardiac:  Regular rate and rhythm without murmur auscultated.   Pulm:  Clear to auscultation bilaterally  Exts: Non edematous BL  LE, warm and well perfused.  MSK:  No joint pains noted.    No results found for this or any previous visit (from the past 72 hour(s)).

## 2015-08-01 NOTE — Assessment & Plan Note (Signed)
Not causing large amount of discomfort at this time. Will see if stopping HCTZ helps.  FU if worsens.

## 2015-08-01 NOTE — Assessment & Plan Note (Signed)
Stop HCTZ as she's had increased gout attacks. Switch to Cozaar for both BP and to lower uric acid.

## 2015-08-14 ENCOUNTER — Telehealth: Payer: Self-pay | Admitting: *Deleted

## 2015-08-14 NOTE — Telephone Encounter (Signed)
Patient stated that after she started taking the Cozaar she started having back pain near her kidneys.  She stopped taking Cozaar about 1-1.5 wk due to the pain.  She started back taking the HCTZ.  Patient is unsure if she should continue the Cozaar or the HCTZ.  Please advise.  Derl Barrow, RN

## 2015-08-16 NOTE — Telephone Encounter (Signed)
Called and spoke with patient.  She began having back pain after being on Cozaar for about a week.  In middle of her back, described as dull ache.  Stopped Cozaar, and pain went away after several days.  She restarted her HCTZ.    Warned her that HCTZ will likely re-trigger gout.  After talking, we decided she should retry the Cozaar and set up a lab visit about a week or so afterwards to recheck creatinine.  If back pain returns, she should stop Cozaar and let me know.  We'll try something else at that point.

## 2015-08-18 ENCOUNTER — Other Ambulatory Visit: Payer: Medicare Other

## 2015-08-28 ENCOUNTER — Other Ambulatory Visit: Payer: Medicare Other

## 2015-08-28 DIAGNOSIS — I10 Essential (primary) hypertension: Secondary | ICD-10-CM | POA: Diagnosis not present

## 2015-08-28 LAB — BASIC METABOLIC PANEL
BUN: 12 mg/dL (ref 7–25)
CO2: 22 mmol/L (ref 20–31)
Calcium: 9.7 mg/dL (ref 8.6–10.4)
Chloride: 107 mmol/L (ref 98–110)
Creat: 0.75 mg/dL (ref 0.50–1.05)
Glucose, Bld: 99 mg/dL (ref 65–99)
Potassium: 4.6 mmol/L (ref 3.5–5.3)
Sodium: 140 mmol/L (ref 135–146)

## 2015-08-28 NOTE — Progress Notes (Signed)
Bmp done today Lisa [name] 

## 2015-09-05 ENCOUNTER — Other Ambulatory Visit: Payer: Self-pay | Admitting: *Deleted

## 2015-09-06 ENCOUNTER — Other Ambulatory Visit: Payer: Self-pay | Admitting: *Deleted

## 2015-09-06 MED ORDER — ESOMEPRAZOLE MAGNESIUM 40 MG PO CPDR
40.0000 mg | DELAYED_RELEASE_CAPSULE | Freq: Every day | ORAL | Status: DC
Start: 2015-09-06 — End: 2016-04-01

## 2015-09-06 NOTE — Telephone Encounter (Signed)
Patient called to check the status of this request.  States that she contacted pharmacy on Monday about needing this filled. Jazmin Hartsell,CMA

## 2015-09-19 ENCOUNTER — Other Ambulatory Visit: Payer: Self-pay | Admitting: *Deleted

## 2015-09-21 MED ORDER — FLUTICASONE PROPIONATE HFA 110 MCG/ACT IN AERO: INHALATION_SPRAY | RESPIRATORY_TRACT | Status: DC

## 2015-09-21 MED ORDER — MONTELUKAST SODIUM 10 MG PO TABS: 10.0000 mg | ORAL_TABLET | Freq: Every day | ORAL | Status: DC

## 2015-09-21 NOTE — Telephone Encounter (Signed)
Patient calling again requesting refills on flovent and singulair

## 2015-10-03 ENCOUNTER — Other Ambulatory Visit: Payer: Self-pay | Admitting: *Deleted

## 2015-10-04 MED ORDER — LOSARTAN POTASSIUM 50 MG PO TABS: 50.0000 mg | ORAL_TABLET | Freq: Every day | ORAL | Status: DC

## 2015-10-04 NOTE — Telephone Encounter (Signed)
2nd request.  Martin, Tamika L, RN  

## 2015-10-04 NOTE — Telephone Encounter (Signed)
Pt is calling because she needs a refill on her Losartan. She said the pharmacy has been faxing [place] since Sunday 10/01/15. She only has 1 pill left. Please call in today. jw

## 2015-11-14 ENCOUNTER — Other Ambulatory Visit: Payer: Self-pay | Admitting: *Deleted

## 2015-11-14 MED ORDER — COLCHICINE 0.6 MG PO TABS: 0.6000 mg | ORAL_TABLET | Freq: Every day | ORAL | Status: DC | PRN

## 2015-12-14 ENCOUNTER — Other Ambulatory Visit: Payer: Self-pay | Admitting: Family Medicine

## 2015-12-14 ENCOUNTER — Telehealth: Payer: Self-pay | Admitting: *Deleted

## 2015-12-14 NOTE — Telephone Encounter (Signed)
I have completed her letter.  We can either send it to her or let her come pick it up.  Please call and let her know this is done and ask which is easier for her.    Thanks!  JW

## 2015-12-14 NOTE — Telephone Encounter (Signed)
Patient received a jury duty summons and would like MD to write a letter excusing her due to her macular degeneration. States she is unable to drive due to this and has problems reading.

## 2015-12-15 NOTE — Telephone Encounter (Signed)
Pt informed.  She request that the letter be mailed.   Will mail now. Rollins Wrightson, Salome Spotted, CMA

## 2015-12-15 NOTE — Telephone Encounter (Signed)
LVM for pt to call. If she calls, please ask her if she wants [place] to mail the letter or if she wants to pick it up. Ottis Stain, CMA

## 2016-01-23 ENCOUNTER — Encounter: Payer: Self-pay | Admitting: Family Medicine

## 2016-01-23 ENCOUNTER — Ambulatory Visit (INDEPENDENT_AMBULATORY_CARE_PROVIDER_SITE_OTHER): Payer: Medicare Other | Admitting: Family Medicine

## 2016-01-23 VITALS — BP 130/72 | HR 70 | Temp 97.8°F | Ht 64.0 in | Wt 228.2 lb

## 2016-01-23 DIAGNOSIS — Z23 Encounter for immunization: Secondary | ICD-10-CM

## 2016-01-23 DIAGNOSIS — K219 Gastro-esophageal reflux disease without esophagitis: Secondary | ICD-10-CM | POA: Diagnosis not present

## 2016-01-23 DIAGNOSIS — I10 Essential (primary) hypertension: Secondary | ICD-10-CM

## 2016-01-23 DIAGNOSIS — E039 Hypothyroidism, unspecified: Secondary | ICD-10-CM | POA: Diagnosis not present

## 2016-01-23 NOTE — Patient Instructions (Signed)
It was good to see you today!  We'll do bloodwork next visit.  Let me know if the gout flares up again

## 2016-01-23 NOTE — Assessment & Plan Note (Signed)
Controlled today no changes.

## 2016-01-23 NOTE — Assessment & Plan Note (Signed)
She declined blood tests today. We'll follow-up in 3 months and check her TSH at that point.

## 2016-01-23 NOTE — Assessment & Plan Note (Signed)
Discussed risks and benefits of long-term PPI use. She like to continue this for now. Will revisit in the future. She does not currently need a refill today.

## 2016-01-23 NOTE — Progress Notes (Signed)
Subjective:    Lisa [name] is a 60 y.o. female who presents to [facility] today for GERD:  1.  GERD:  Controlled with her PPI. If try transitioning her off this in the past. She isn't able tolerate this. No weight loss. No hematemesis. No melena. Worse after spicy meals and she has cut down on these. Still consuming daily alcohol.  2.  Hypertension:  Long-term problem for this patient.  No adverse effects from medication.  Not checking it regularly.  No HA, CP, dizziness, shortness of breath, palpitations, or LE swelling.   BP Readings from Last 3 Encounters:  01/23/16 130/72  08/01/15 (!) 144/77  04/03/15 (!) 141/84    ROS as above.    The following portions of the patient's history were reviewed and updated as appropriate: allergies, current medications, past medical history, family and social history, and problem list. Patient is a nonsmoker.    PMH reviewed.  Past Medical History:  Diagnosis Date  . Alcoholism ([place])    Chronic -- not interested in cutting back.  Has cut back from 12 beers daily in 2014.    Marland Kitchen Allergy   . Asthma   . GERD (gastroesophageal reflux disease)   . Hypertension   . Hypothyroidism    Past Surgical History:  Procedure Laterality Date  . COLONOSCOPY  2010   [facility] - Repeat in 10 years as normal findings     Medications reviewed. Current Outpatient Prescriptions  Medication Sig Dispense Refill  . albuterol (VENTOLIN HFA) 108 (90 BASE) MCG/ACT inhaler Inhale 2 puffs into the lungs every 4 (four) hours as needed. For wheezing 1 Inhaler 1  . aspirin (ADULT ASPIRIN EC LOW STRENGTH) 81 MG EC tablet Take 81 mg by mouth daily.      . cetirizine (ZYRTEC) 10 MG tablet take 1 tablet by mouth once daily 90 tablet 2  . colchicine (COLCRYS) 0.6 MG tablet Take 1 tablet (0.6 mg total) by mouth daily as needed. 25 tablet 1  . esomeprazole (NEXIUM) 40 MG capsule Take 1 capsule (40 mg total) by mouth daily at 12 noon. 30 capsule 6  . fluticasone (FLOVENT HFA) 110  MCG/ACT inhaler inhale 2 puffs by mouth every morning and 2 every evening 12 g 3  . ibuprofen (ADVIL,MOTRIN) 600 MG tablet Take 1 tablet (600 mg total) by mouth every 8 (eight) hours as needed. 20 tablet 0  . levothyroxine (SYNTHROID, LEVOTHROID) 25 MCG tablet Take 1 tablet (25 mcg total) by mouth daily. 90 tablet 3  . losartan (COZAAR) 50 MG tablet Take 1 tablet (50 mg total) by mouth daily. 90 tablet 2  . metoprolol succinate (TOPROL-XL) 100 MG 24 hr tablet Take 1 tablet (100 mg total) by mouth daily. for high blood pressure 90 tablet 2  . montelukast (SINGULAIR) 10 MG tablet Take 1 tablet (10 mg total) by mouth daily. 30 tablet 4   No current facility-administered medications for this visit.      Objective:   Physical Exam BP 130/72   Pulse 70   Temp 97.8 F (36.6 C) (Oral)   Ht 5\' 4"  (1.626 m)   Wt 228 lb 3.2 oz (103.5 kg)   BMI 39.17 kg/m  Gen:  Alert, cooperative patient who appears stated age in no acute distress.  Vital signs reviewed. HEENT: EOMI,  MMM Cardiac:  Regular rate and rhythm without murmur auscultated.  Good S1/S2. Pulm:  Clear to auscultation bilaterally with good air movement.  No wheezes or rales noted.  Abd:  Soft/nondistended/nontender.   Exts: Non edematous BL  LE, warm and well perfused.   No results found for this or any previous visit (from the past 72 hour(s)).

## 2016-02-07 ENCOUNTER — Other Ambulatory Visit: Payer: Self-pay | Admitting: Family Medicine

## 2016-03-01 ENCOUNTER — Other Ambulatory Visit: Payer: Self-pay | Admitting: Family Medicine

## 2016-03-08 ENCOUNTER — Other Ambulatory Visit: Payer: Self-pay | Admitting: Family Medicine

## 2016-04-01 ENCOUNTER — Other Ambulatory Visit: Payer: Self-pay | Admitting: Family Medicine

## 2016-06-07 ENCOUNTER — Other Ambulatory Visit: Payer: Self-pay | Admitting: Family Medicine

## 2016-06-17 ENCOUNTER — Other Ambulatory Visit: Payer: Self-pay | Admitting: Family Medicine

## 2016-07-09 ENCOUNTER — Encounter: Payer: Self-pay | Admitting: Family Medicine

## 2016-07-09 ENCOUNTER — Ambulatory Visit (INDEPENDENT_AMBULATORY_CARE_PROVIDER_SITE_OTHER): Payer: Medicare Other | Admitting: Family Medicine

## 2016-07-09 VITALS — BP 136/86 | HR 66 | Temp 98.2°F | Ht 64.0 in | Wt 225.0 lb

## 2016-07-09 DIAGNOSIS — Z1159 Encounter for screening for other viral diseases: Secondary | ICD-10-CM

## 2016-07-09 DIAGNOSIS — Z114 Encounter for screening for human immunodeficiency virus [HIV]: Secondary | ICD-10-CM

## 2016-07-09 DIAGNOSIS — I10 Essential (primary) hypertension: Secondary | ICD-10-CM | POA: Diagnosis not present

## 2016-07-09 DIAGNOSIS — Z0000 Encounter for general adult medical examination without abnormal findings: Secondary | ICD-10-CM

## 2016-07-09 DIAGNOSIS — K219 Gastro-esophageal reflux disease without esophagitis: Secondary | ICD-10-CM | POA: Diagnosis not present

## 2016-07-09 DIAGNOSIS — M109 Gout, unspecified: Secondary | ICD-10-CM

## 2016-07-09 DIAGNOSIS — E039 Hypothyroidism, unspecified: Secondary | ICD-10-CM

## 2016-07-09 DIAGNOSIS — E785 Hyperlipidemia, unspecified: Secondary | ICD-10-CM

## 2016-07-09 LAB — COMPREHENSIVE METABOLIC PANEL
ALT: 23 U/L (ref 6–29)
AST: 24 U/L (ref 10–35)
Albumin: 4.3 g/dL (ref 3.6–5.1)
Alkaline Phosphatase: 77 U/L (ref 33–130)
BUN: 13 mg/dL (ref 7–25)
CO2: 21 mmol/L (ref 20–31)
Calcium: 10 mg/dL (ref 8.6–10.4)
Chloride: 104 mmol/L (ref 98–110)
Creat: 0.8 mg/dL (ref 0.50–0.99)
Glucose, Bld: 90 mg/dL (ref 65–99)
Potassium: 4.3 mmol/L (ref 3.5–5.3)
Sodium: 142 mmol/L (ref 135–146)
Total Bilirubin: 0.6 mg/dL (ref 0.2–1.2)
Total Protein: 6.8 g/dL (ref 6.1–8.1)

## 2016-07-09 LAB — CBC
HCT: 44.5 % (ref 35.0–45.0)
Hemoglobin: 15 g/dL (ref 11.7–15.5)
MCH: 31.6 pg (ref 27.0–33.0)
MCHC: 33.7 g/dL (ref 32.0–36.0)
MCV: 93.7 fL (ref 80.0–100.0)
MPV: 11 fL (ref 7.5–12.5)
Platelets: 289 10*3/uL (ref 140–400)
RBC: 4.75 MIL/uL (ref 3.80–5.10)
RDW: 13.3 % (ref 11.0–15.0)
WBC: 12 10*3/uL — ABNORMAL HIGH (ref 3.8–10.8)

## 2016-07-09 LAB — LIPID PANEL
Cholesterol: 208 mg/dL — ABNORMAL HIGH (ref <200)
HDL: 65 mg/dL (ref >50)
LDL Cholesterol: 117 mg/dL — ABNORMAL HIGH (ref <100)
Total CHOL/HDL Ratio: 3.2 Ratio (ref <5.0)
Triglycerides: 129 mg/dL (ref <150)
VLDL: 26 mg/dL (ref <30)

## 2016-07-09 NOTE — Patient Instructions (Signed)
It was good to see you today  We are checking labs today. I'll let you know the results. Let me know if you need any more refills for things.  Things look good from my standpoint.    I'll see you back in 6 months

## 2016-07-09 NOTE — Progress Notes (Signed)
Subjective:    Lisa [name] is a 61 y.o. female who presents to [facility] today for HTN:  1.   Hypertension:  Long-term problem for this patient.  No adverse effects from medication.  Not checking it regularly.  No HA, CP, dizziness, shortness of breath, palpitations, or LE swelling.   BP Readings from Last 3 Encounters:  07/09/16 136/86  01/23/16 130/72  08/01/15 (!) 144/77   2.  GERD:  Doing well from the standpoint. She has been on chronic PPIs for years. She's had no falls. No fractures. No weight loss or vomiting.  Triggers are large meals.    3.  HLD:  Last lipid panel listed below.    Currently is not on Statin.  NO chest pain or dyspea.  Not doing much for exercise.  .   Lab Results  Component Value Date   CHOL 210 (H) 02/07/2015   CHOL 183 12/17/2013   CHOL 212 (H) 10/13/2012   Lab Results  Component Value Date   HDL 54 02/07/2015   HDL 55 12/17/2013   HDL 59 10/13/2012   Lab Results  Component Value Date   LDLCALC 107 02/07/2015   LDLCALC 87 12/17/2013   LDLCALC 112 (H) 10/13/2012   Lab Results  Component Value Date   TRIG 247 (H) 02/07/2015   TRIG 204 (H) 12/17/2013   TRIG 204 (H) 10/13/2012   Lab Results  Component Value Date   CHOLHDL 3.9 02/07/2015   CHOLHDL 3.3 12/17/2013   CHOLHDL 3.6 10/13/2012    ROS as above per HPI.    The following portions of the patient's history were reviewed and updated as appropriate: allergies, current medications, past medical history, family and social history, and problem list. Patient is a nonsmoker.    PMH reviewed.  Past Medical History:  Diagnosis Date  . Alcoholism ([place])    Chronic -- not interested in cutting back.  Has cut back from 12 beers daily in 2014.    Marland Kitchen Allergy   . Asthma   . GERD (gastroesophageal reflux disease)   . Hypertension   . Hypothyroidism    Past Surgical History:  Procedure Laterality Date  . COLONOSCOPY  2010   [facility] - Repeat in 10 years as normal findings     Medications  reviewed. Current Outpatient Prescriptions  Medication Sig Dispense Refill  . albuterol (VENTOLIN HFA) 108 (90 BASE) MCG/ACT inhaler Inhale 2 puffs into the lungs every 4 (four) hours as needed. For wheezing 1 Inhaler 1  . aspirin (ADULT ASPIRIN EC LOW STRENGTH) 81 MG EC tablet Take 81 mg by mouth daily.      . cetirizine (ZYRTEC) 10 MG tablet take 1 tablet by mouth once daily 90 tablet 2  . COLCRYS 0.6 MG tablet take 1 tablet by mouth once daily if needed 25 tablet 1  . fluticasone (FLOVENT HFA) 110 MCG/ACT inhaler inhale 2 puffs by mouth every morning and 2 every evening 12 g 3  . ibuprofen (ADVIL,MOTRIN) 600 MG tablet Take 1 tablet (600 mg total) by mouth every 8 (eight) hours as needed. 20 tablet 0  . levothyroxine (SYNTHROID, LEVOTHROID) 25 MCG tablet take 1 tablet by mouth once daily 90 tablet 3  . losartan (COZAAR) 50 MG tablet take 1 tablet by mouth once daily 90 tablet 2  . metoprolol succinate (TOPROL-XL) 100 MG 24 hr tablet take 1 tablet by mouth once daily for high blood pressure 90 tablet 2  . montelukast (SINGULAIR) 10 MG tablet take  1 tablet by mouth once daily 30 tablet 4  . NEXIUM 40 MG capsule take 1 capsule by mouth once daily AT 12 NOON 30 capsule 6   No current facility-administered medications for this visit.      Objective:   Physical Exam BP 136/86   Pulse 66   Temp 98.2 F (36.8 C) (Oral)   Ht 5\' 4"  (1.626 m)   Wt 225 lb (102.1 kg)   SpO2 99%   BMI 38.62 kg/m  Gen:  Alert, cooperative patient who appears stated age in no acute distress.  Vital signs reviewed. HEENT: EOMI,  MMM.  Looks at me out of the corners of her eyes, consistent with macular degeneration.   Neck:  No LAD Cardiac:  Regular rate and rhythm without murmur auscultated.  Good S1/S2. Pulm:  Clear to auscultation bilaterally with good air movement.  No wheezes or rales noted.   Abd:  Soft/nondistended/nontender.   Exts: Non edematous BL  LE, warm and well perfused.   No results found for  this or any previous visit (from the past 72 hour(s)).

## 2016-07-09 NOTE — Assessment & Plan Note (Signed)
It has been 2 years since her last lipid panel. We are checking lipids today. She is not currently on a statin.

## 2016-07-09 NOTE — Assessment & Plan Note (Signed)
She is doing well without any recent gout attacks. She has Colcrys if she needs it.

## 2016-07-09 NOTE — Assessment & Plan Note (Signed)
Continuing her chronic PPI. Inability to tolerate any time off of PPI secondary to degree of rebound reflux

## 2016-07-09 NOTE — Assessment & Plan Note (Signed)
At goal today. No changes. She does not need any refills.

## 2016-07-09 NOTE — Assessment & Plan Note (Signed)
Checking hep C and HIV today.

## 2016-07-09 NOTE — Assessment & Plan Note (Signed)
On low-dose Synthroid. We are checking TSH today.

## 2016-07-10 LAB — HEPATITIS C ANTIBODY: HCV Ab: NEGATIVE

## 2016-07-10 LAB — HIV ANTIBODY (ROUTINE TESTING W REFLEX): HIV 1&2 Ab, 4th Generation: NONREACTIVE

## 2016-07-10 LAB — TSH: TSH: 5.72 mIU/L — ABNORMAL HIGH

## 2016-07-12 ENCOUNTER — Encounter: Payer: Self-pay | Admitting: Family Medicine

## 2016-08-22 ENCOUNTER — Other Ambulatory Visit: Payer: Self-pay | Admitting: Family Medicine

## 2016-08-26 NOTE — Telephone Encounter (Signed)
Pt is calling for a refill on her fluticasone. She said that the pharmacy requested this last Thursday. jw

## 2016-09-09 ENCOUNTER — Other Ambulatory Visit: Payer: Self-pay | Admitting: Family Medicine

## 2016-10-08 ENCOUNTER — Other Ambulatory Visit: Payer: Self-pay | Admitting: Family Medicine

## 2016-10-08 DIAGNOSIS — Z1231 Encounter for screening mammogram for malignant neoplasm of breast: Secondary | ICD-10-CM

## 2016-10-11 ENCOUNTER — Ambulatory Visit
Admission: RE | Admit: 2016-10-11 | Discharge: 2016-10-11 | Disposition: A | Discharge status: Home / Self Care | Payer: Medicare Other | Source: Ambulatory Visit | Attending: Family Medicine | Admitting: Family Medicine

## 2016-10-11 DIAGNOSIS — Z1231 Encounter for screening mammogram for malignant neoplasm of breast: Secondary | ICD-10-CM | POA: Diagnosis not present

## 2016-10-28 ENCOUNTER — Other Ambulatory Visit: Payer: Self-pay | Admitting: Family Medicine

## 2016-11-08 ENCOUNTER — Other Ambulatory Visit: Payer: Self-pay | Admitting: Family Medicine

## 2016-12-07 ENCOUNTER — Other Ambulatory Visit: Payer: Self-pay | Admitting: Family Medicine

## 2017-01-02 ENCOUNTER — Other Ambulatory Visit: Payer: Self-pay | Admitting: Family Medicine

## 2017-01-24 ENCOUNTER — Ambulatory Visit (INDEPENDENT_AMBULATORY_CARE_PROVIDER_SITE_OTHER): Payer: Medicare Other | Admitting: Family Medicine

## 2017-01-24 ENCOUNTER — Encounter: Payer: Self-pay | Admitting: Family Medicine

## 2017-01-24 DIAGNOSIS — Z0000 Encounter for general adult medical examination without abnormal findings: Secondary | ICD-10-CM

## 2017-01-24 DIAGNOSIS — F1010 Alcohol abuse, uncomplicated: Secondary | ICD-10-CM

## 2017-01-24 DIAGNOSIS — Z23 Encounter for immunization: Secondary | ICD-10-CM | POA: Diagnosis present

## 2017-01-24 DIAGNOSIS — I10 Essential (primary) hypertension: Secondary | ICD-10-CM

## 2017-01-24 DIAGNOSIS — R413 Other amnesia: Secondary | ICD-10-CM | POA: Insufficient documentation

## 2017-01-24 NOTE — Assessment & Plan Note (Signed)
Flu shot today 

## 2017-01-24 NOTE — Assessment & Plan Note (Signed)
Continues to cut down on drinking.  Precontemplative about complete EtOH cessation

## 2017-01-24 NOTE — Patient Instructions (Signed)
I'm glad things are going well.  If you have any more memory issues, be sure to let me know.  I'll see you back in 6 months

## 2017-01-24 NOTE — Assessment & Plan Note (Signed)
At goal.  Continue current therapy. 

## 2017-01-24 NOTE — Progress Notes (Signed)
Subjective:    Lisa [name] is a 61 y.o. female who presents to [facility] today for HTN:  1.   Hypertension:  Long-term problem for this patient.  No adverse effects from medication.  Not checking it regularly.  No HA, CP, dizziness, shortness of breath, palpitations, or LE swelling.   BP Readings from Last 3 Encounters:  01/24/17 112/68  07/09/16 136/86  01/23/16 130/72   2.  Memory issue:  Patient states she was at work and didn't realize she had left one room and entered another.  Couldn't remember walking into this new room.  This happened about one week ago. She has never had any sort of memory issues like this before. She did not have any falls. No weakness or numbness. No loss of consciousness. She was immediately able to realize which room she was in. Has not happened afterwards. No slurring of speech.   ROS as above per HPI.  Pertinently, no chest pain, palpitations, SOB, Fever, Chills, Abd pain, N/V/D.   The following portions of the patient's history were reviewed and updated as appropriate: allergies, current medications, past medical history, family and social history, and problem list. Patient is a nonsmoker.    PMH reviewed.  Past Medical History:  Diagnosis Date  . Alcoholism ([place])    Chronic -- not interested in cutting back.  Has cut back from 12 beers daily in 2014.    Marland Kitchen Allergy   . Asthma   . GERD (gastroesophageal reflux disease)   . Hypertension   . Hypothyroidism    Past Surgical History:  Procedure Laterality Date  . COLONOSCOPY  2010   [facility] - Repeat in 10 years as normal findings     Medications reviewed. Current Outpatient Prescriptions  Medication Sig Dispense Refill  . albuterol (VENTOLIN HFA) 108 (90 BASE) MCG/ACT inhaler Inhale 2 puffs into the lungs every 4 (four) hours as needed. For wheezing 1 Inhaler 1  . aspirin (ADULT ASPIRIN EC LOW STRENGTH) 81 MG EC tablet Take 81 mg by mouth daily.      . cetirizine (ZYRTEC) 10 MG tablet take 1 tablet by  mouth once daily 90 tablet 2  . COLCRYS 0.6 MG tablet take 1 tablet by mouth once daily if needed 25 tablet 1  . esomeprazole (NEXIUM) 40 MG capsule take 1 capsule by mouth once daily AT 12 NOON 30 capsule 6  . FLOVENT HFA 110 MCG/ACT inhaler inhale 2 puffs by mouth every 12 hours every morning and every evening 12 g 3  . levothyroxine (SYNTHROID, LEVOTHROID) 25 MCG tablet take 1 tablet by mouth once daily 90 tablet 3  . losartan (COZAAR) 50 MG tablet take 1 tablet by mouth once daily 90 tablet 2  . metoprolol succinate (TOPROL-XL) 100 MG 24 hr tablet take 1 tablet by mouth once daily 90 tablet 2  . montelukast (SINGULAIR) 10 MG tablet take 1 tablet by mouth once daily 30 tablet 4   No current facility-administered medications for this visit.      Objective:   Physical Exam BP 112/68   Pulse (!) 56   Temp 98.6 F (37 C) (Oral)   Ht 5\' 4"  (1.626 m)   Wt 225 lb (102.1 kg)   SpO2 95%   BMI 38.62 kg/m  Gen:  Alert, cooperative patient who appears stated age in no acute distress.  Vital signs reviewed. HEENT: EOMI,  MMM Cardiac:  Regular rate and rhythm without murmur auscultated.  Good S1/S2. Pulm:  Clear to  auscultation bilaterally with good air movement.  No wheezes or rales noted.   Abd:  Soft/nondistended/nontender.  Good bowel sounds throughout all four quadrants.  No masses noted.  Exts: Non edematous BL  LE, warm and well perfused.  Neuro: Alert and oriented 4. Mini-cog is 3/3.  Able to easily remember past presidents and spell "world" backwards.  No focal neuro deficits noted.   No results found for this or any previous visit (from the past 72 hour(s)).

## 2017-01-24 NOTE — Assessment & Plan Note (Signed)
Very acute -- less than 30 seconds.  No post-ictal symptoms.  No other neurological symptoms.  No syncope.  No evidence of stroke/CVA.  Discussed with her that if this recurs, she needs to return for full evaluation.

## 2017-03-25 ENCOUNTER — Other Ambulatory Visit: Payer: Self-pay | Admitting: *Deleted

## 2017-03-25 NOTE — Telephone Encounter (Signed)
Patient left message on nurseline requesting refill on losartan, states she only has 2 pills left.

## 2017-03-26 MED ORDER — LOSARTAN POTASSIUM 50 MG PO TABS: 50.0000 mg | ORAL_TABLET | Freq: Every day | ORAL | 2 refills | Status: DC

## 2017-05-30 ENCOUNTER — Other Ambulatory Visit: Payer: Self-pay | Admitting: *Deleted

## 2017-05-30 MED ORDER — ESOMEPRAZOLE MAGNESIUM 40 MG PO CPDR: DELAYED_RELEASE_CAPSULE | ORAL | 6 refills | Status: DC

## 2017-06-24 ENCOUNTER — Other Ambulatory Visit: Payer: Self-pay | Admitting: *Deleted

## 2017-06-24 MED ORDER — FLUTICASONE PROPIONATE HFA 110 MCG/ACT IN AERO: INHALATION_SPRAY | RESPIRATORY_TRACT | 0 refills | Status: DC

## 2017-06-27 ENCOUNTER — Other Ambulatory Visit: Payer: Self-pay

## 2017-06-27 ENCOUNTER — Ambulatory Visit (INDEPENDENT_AMBULATORY_CARE_PROVIDER_SITE_OTHER): Payer: Medicare Other | Admitting: Family Medicine

## 2017-06-27 ENCOUNTER — Encounter: Payer: Self-pay | Admitting: Family Medicine

## 2017-06-27 VITALS — BP 118/72 | HR 59 | Temp 98.1°F | Ht 64.0 in | Wt 226.8 lb

## 2017-06-27 DIAGNOSIS — I10 Essential (primary) hypertension: Secondary | ICD-10-CM

## 2017-06-27 DIAGNOSIS — E785 Hyperlipidemia, unspecified: Secondary | ICD-10-CM

## 2017-06-27 DIAGNOSIS — F1010 Alcohol abuse, uncomplicated: Secondary | ICD-10-CM

## 2017-06-27 DIAGNOSIS — H3530 Unspecified macular degeneration: Secondary | ICD-10-CM | POA: Diagnosis not present

## 2017-06-27 DIAGNOSIS — H259 Unspecified age-related cataract: Secondary | ICD-10-CM | POA: Diagnosis not present

## 2017-06-27 DIAGNOSIS — J4520 Mild intermittent asthma, uncomplicated: Secondary | ICD-10-CM | POA: Diagnosis not present

## 2017-06-27 DIAGNOSIS — K219 Gastro-esophageal reflux disease without esophagitis: Secondary | ICD-10-CM

## 2017-06-27 MED ORDER — ALBUTEROL SULFATE HFA 108 (90 BASE) MCG/ACT IN AERS: 2.0000 | INHALATION_SPRAY | Freq: Four times a day (QID) | RESPIRATORY_TRACT | 1 refills | Status: DC | PRN

## 2017-06-27 NOTE — Assessment & Plan Note (Signed)
Checking lipids today.

## 2017-06-27 NOTE — Assessment & Plan Note (Signed)
Down to 2 beers a day.

## 2017-06-27 NOTE — Patient Instructions (Signed)
It was good to see you again today.   I have referred you back to Dr. Katy Fitch for your eyes.    Come back and see me in 6 months.

## 2017-06-27 NOTE — Assessment & Plan Note (Signed)
Also at goal.  Mostly with brash-like symptoms.  Controlled with Nexium.  No weight loss or night sweats.

## 2017-06-27 NOTE — Assessment & Plan Note (Signed)
Still with ongoing vision issues.  She has known bilateral cataracts which are mild.  She would like to be referred back to ophthalmology.  We will place this today for possible cataract removal to see if this helps well with her vision based on ophthalmology recommendations.

## 2017-06-27 NOTE — Assessment & Plan Note (Signed)
At goal.  No change to medication regimen.  Checking labs today.

## 2017-06-27 NOTE — Progress Notes (Signed)
Subjective:    Lisa [name] is a 62 y.o. female who presents to [facility] today for HTN:  1.  Hypertension:  Long-term problem for this patient.  No adverse effects from medication.  Not checking it regularly.  No HA, CP, dizziness, shortness of breath, palpitations, or LE swelling.   BP Readings from Last 3 Encounters:  06/27/17 118/72  01/24/17 112/68  07/09/16 136/86   2.  GERD: Ongoing issue but better with Nexium.  He is to take this daily.  No weight loss.  No abdominal pain such as sharp or stabbing pain.  Her GERD-like symptoms are mostly brash.  No nausea or vomiting.  She is eating and drinking well.  3.  Gout:  No further attacks.  She still has colchicine at home.   4.  Eye issues:  Would like to be referred back to ophthalmology.  Eyesight continues to deteriorate.  Vision mostly decreased centrally, though some opaqueness throughout.  Has known cataracts.  Wants to discuss with optho whether cataract removal will help her vision at all.    ROS as above per HPI.  Pertinently, no chest pain, palpitations, SOB, Fever, Chills, Abd pain, N/V/D.   The following portions of the patient's history were reviewed and updated as appropriate: allergies, current medications, past medical history, family and social history, and problem list. Patient is a nonsmoker.    PMH reviewed.  Past Medical History:  Diagnosis Date  . Alcoholism ([place])    Chronic -- not interested in cutting back.  Has cut back from 12 beers daily in 2014.    Marland Kitchen Allergy   . Asthma   . GERD (gastroesophageal reflux disease)   . Hypertension   . Hypothyroidism    Past Surgical History:  Procedure Laterality Date  . COLONOSCOPY  2010   [facility] - Repeat in 10 years as normal findings     Medications reviewed. Current Outpatient Medications  Medication Sig Dispense Refill  . albuterol (VENTOLIN HFA) 108 (90 BASE) MCG/ACT inhaler Inhale 2 puffs into the lungs every 4 (four) hours as needed. For wheezing 1 Inhaler 1   . aspirin (ADULT ASPIRIN EC LOW STRENGTH) 81 MG EC tablet Take 81 mg by mouth daily.      . cetirizine (ZYRTEC) 10 MG tablet take 1 tablet by mouth once daily 90 tablet 2  . COLCRYS 0.6 MG tablet take 1 tablet by mouth once daily if needed 25 tablet 1  . esomeprazole (NEXIUM) 40 MG capsule take 1 capsule by mouth once daily AT 12 NOON 30 capsule 6  . fluticasone (FLOVENT HFA) 110 MCG/ACT inhaler inhale 2 puffs by mouth every 12 hours every morning and every evening 12 g 0  . levothyroxine (SYNTHROID, LEVOTHROID) 25 MCG tablet take 1 tablet by mouth once daily 90 tablet 3  . losartan (COZAAR) 50 MG tablet Take 1 tablet (50 mg total) daily by mouth. 90 tablet 2  . metoprolol succinate (TOPROL-XL) 100 MG 24 hr tablet take 1 tablet by mouth once daily 90 tablet 2  . montelukast (SINGULAIR) 10 MG tablet take 1 tablet by mouth once daily 30 tablet 4   No current facility-administered medications for this visit.      Objective:   Physical Exam BP 118/72   Pulse (!) 59   Temp 98.1 F (36.7 C) (Oral)   Ht 5\' 4"  (1.626 m)   Wt 226 lb 12.8 oz (102.9 kg)   SpO2 98%   BMI 38.93 kg/m  Gen:  Alert, cooperative patient who appears stated age in no acute distress.  Vital signs reviewed. HEENT: EOMI,  MMM. Faint opacity BL pupils.  She uses her peripheral vision to see.   Cardiac:  Regular rate and rhythm without murmur auscultated.  Good S1/S2. Pulm:  Clear to auscultation bilaterally with good air movement.  No wheezes or rales noted.   Abd:  Soft/nondistended/nontender.   Exts: Non edematous BL  LE, warm and well perfused.   No results found for this or any previous visit (from the past 72 hour(s)).

## 2017-06-27 NOTE — Assessment & Plan Note (Signed)
Controlled.  Only using her inhaler once every 1-2 weeks if that.  Uses her Flovent daily.  She needs a refill for her inhaler as last one has expired.

## 2017-06-28 LAB — COMPREHENSIVE METABOLIC PANEL
ALT: 22 IU/L (ref 0–32)
AST: 21 IU/L (ref 0–40)
Albumin/Globulin Ratio: 1.7 (ref 1.2–2.2)
Albumin: 4.3 g/dL (ref 3.6–4.8)
Alkaline Phosphatase: 80 IU/L (ref 39–117)
BUN/Creatinine Ratio: 12 (ref 12–28)
BUN: 9 mg/dL (ref 8–27)
Bilirubin Total: 0.6 mg/dL (ref 0.0–1.2)
CO2: 19 mmol/L — ABNORMAL LOW (ref 20–29)
Calcium: 9.9 mg/dL (ref 8.7–10.3)
Chloride: 103 mmol/L (ref 96–106)
Creatinine, Ser: 0.74 mg/dL (ref 0.57–1.00)
GFR calc Af Amer: 101 mL/min/{1.73_m2} (ref >59)
GFR calc non Af Amer: 88 mL/min/{1.73_m2} (ref >59)
Globulin, Total: 2.6 g/dL (ref 1.5–4.5)
Glucose: 96 mg/dL (ref 65–99)
Potassium: 4.7 mmol/L (ref 3.5–5.2)
Sodium: 142 mmol/L (ref 134–144)
Total Protein: 6.9 g/dL (ref 6.0–8.5)

## 2017-06-28 LAB — CBC
Hematocrit: 43.1 % (ref 34.0–46.6)
Hemoglobin: 14.8 g/dL (ref 11.1–15.9)
MCH: 31.8 pg (ref 26.6–33.0)
MCHC: 34.3 g/dL (ref 31.5–35.7)
MCV: 93 fL (ref 79–97)
Platelets: 278 10*3/uL (ref 150–379)
RBC: 4.66 x10E6/uL (ref 3.77–5.28)
RDW: 13 % (ref 12.3–15.4)
WBC: 8.8 10*3/uL (ref 3.4–10.8)

## 2017-06-28 LAB — LIPID PANEL
Chol/HDL Ratio: 3.5 ratio (ref 0.0–4.4)
Cholesterol, Total: 212 mg/dL — ABNORMAL HIGH (ref 100–199)
HDL: 60 mg/dL (ref >39)
LDL Calculated: 119 mg/dL — ABNORMAL HIGH (ref 0–99)
Triglycerides: 164 mg/dL — ABNORMAL HIGH (ref 0–149)
VLDL Cholesterol Cal: 33 mg/dL (ref 5–40)

## 2017-06-28 LAB — TSH: TSH: 3.74 u[IU]/mL (ref 0.450–4.500)

## 2017-06-30 ENCOUNTER — Encounter: Payer: Self-pay | Admitting: Family Medicine

## 2017-08-12 ENCOUNTER — Other Ambulatory Visit: Payer: Self-pay

## 2017-08-12 NOTE — Telephone Encounter (Signed)
Pt requesting refill of singulair. Her call back [phone] Wallace Cullens, RN

## 2017-08-13 MED ORDER — MONTELUKAST SODIUM 10 MG PO TABS: 10.0000 mg | ORAL_TABLET | Freq: Every day | ORAL | 6 refills | Status: DC

## 2017-08-29 ENCOUNTER — Other Ambulatory Visit: Payer: Self-pay | Admitting: Family Medicine

## 2017-10-22 ENCOUNTER — Other Ambulatory Visit: Payer: Self-pay | Admitting: Family Medicine

## 2017-11-22 ENCOUNTER — Other Ambulatory Visit: Payer: Self-pay | Admitting: Family Medicine

## 2017-11-28 ENCOUNTER — Other Ambulatory Visit: Payer: Self-pay | Admitting: Family Medicine

## 2018-01-12 ENCOUNTER — Other Ambulatory Visit: Payer: Self-pay

## 2018-01-12 ENCOUNTER — Encounter: Payer: Self-pay | Admitting: Family Medicine

## 2018-01-12 ENCOUNTER — Ambulatory Visit (INDEPENDENT_AMBULATORY_CARE_PROVIDER_SITE_OTHER): Payer: Medicare Other | Admitting: Family Medicine

## 2018-01-12 VITALS — BP 140/88 | HR 74 | Temp 98.2°F | Ht 64.0 in | Wt 229.0 lb

## 2018-01-12 DIAGNOSIS — B372 Candidiasis of skin and nail: Secondary | ICD-10-CM

## 2018-01-12 MED ORDER — NYSTATIN 100000 UNIT/GM EX POWD: Freq: Four times a day (QID) | CUTANEOUS | 3 refills | Status: DC

## 2018-01-12 NOTE — Patient Instructions (Addendum)
   Try Tide Free and Clear Detergent  Keep the area clean and dry---avoid getting sand in the area   Apply nystatin up to four times a day   Call if this is not improved

## 2018-01-12 NOTE — Progress Notes (Signed)
  Patient Name: Lisa [name] Date of Birth: 20-Apr-1956 Date of Visit: 01/12/18 PCP: Alveda Reasons, MD  Chief Complaint: Rash  Subjective: Lisa [name] is a pleasant 62 y.o. year old woman with a history of hypertension, hypothyroidism, and overweight status presenting with a several day history of rash in her left inframammary area.  She reports this is painful and at times itchy.  She has tried a number of creams on this including Lamisil and a steroid cream.  The rash is worsened with the steroid cream. She is planning to the beach later this week and hopes to improve this. Today she denies any similar prior rash.  She is wearing a cotton bra.  She denies change in detergent.    ROS:  ROS Negative except for as above I have reviewed the patient's medical, surgical, family, and social history as appropriate.   Vitals:   01/12/18 1118  BP: 140/88  Pulse: 74  Temp: 98.2 F (36.8 C)  SpO2: 97%   Filed Weights   01/12/18 1118  Weight: 229 lb (103.9 kg)   Vital signs reviewed HEENT: Normal dentition She is well-appearing.  Skin is warm she is fair skinned.  In the left inframammary area there is an erythematous papular area measuring nearly 10 cm x 6 cm oblong in shape.  There are satellite lesions is bright red. Lisa [name] was seen today for rash under left breast.  Diagnoses and all orders for this visit:  Candidal intertrigo -     nystatin (MYCOSTATIN/NYSTOP) powder; Apply topically 4 (four) times daily. -  Discussed keeping the area clean and dry.  We discussed patting the area dry after she showers we discussed changing her detergent to free and clear detergent.  We discussed the risk that this did not improve I would recommend a pill form or another topical formulation instead of nystatin.   Dorris Singh, MD Family Medicine Teaching Service

## 2018-01-14 ENCOUNTER — Telehealth: Payer: Self-pay

## 2018-01-14 DIAGNOSIS — L304 Erythema intertrigo: Secondary | ICD-10-CM

## 2018-01-14 MED ORDER — FLUCONAZOLE 150 MG PO TABS: 150.0000 mg | ORAL_TABLET | ORAL | 0 refills | Status: DC

## 2018-01-14 NOTE — Telephone Encounter (Signed)
To you 

## 2018-01-14 NOTE — Telephone Encounter (Signed)
Please call patient. Fluconazole 150 mg once a week for four weeks prescribed. This is at her pharmacy. Patient has already used an azole and nystatin.  Please advise patient that she should not take Colchicine (used for gout, last Rx in 2017) while she is on this medication.  Dorris Singh, MD  Family Medicine Teaching Service

## 2018-01-14 NOTE — Telephone Encounter (Signed)
Patient called regarding yeast rash under breasts. States the nystatin powder has helped the itching only and would like it cleared up before going out of town of Friday. Stated MD mentioned calling in a pill for this.  Call back is [phone].  Danley Danker, RN [facility] RN)

## 2018-01-15 NOTE — Telephone Encounter (Signed)
Attempted to call pt, no answer. Will try again later. Deseree Blount, CMA  

## 2018-01-16 ENCOUNTER — Encounter: Payer: Self-pay | Admitting: *Deleted

## 2018-01-16 NOTE — Telephone Encounter (Signed)
Attempted to call again.  No answer.  Created and sent a [employer]. Alyissa Whidbee, Salome Spotted, CMA

## 2018-01-19 ENCOUNTER — Other Ambulatory Visit: Payer: Self-pay | Admitting: Family Medicine

## 2018-01-20 ENCOUNTER — Telehealth: Payer: Self-pay

## 2018-01-20 NOTE — Telephone Encounter (Signed)
Patient left message that rash under breast not improved with Nystatin powder and fluconazole tablet. In fact, is now under other breast. Would like advice on what to do next to improve this.  Call back is [phone]  Danley Danker, RN [facility])

## 2018-01-20 NOTE — Telephone Encounter (Signed)
Called patient. Reports left inframammary area improving, new rash on right. Recently returned from beach. Will continue current care (Nystatin, clean, dry area, oral azole once per week). Reviewed reasons to call or return to care.

## 2018-01-27 ENCOUNTER — Other Ambulatory Visit: Payer: Self-pay

## 2018-01-27 ENCOUNTER — Encounter: Payer: Self-pay | Admitting: Family Medicine

## 2018-01-27 ENCOUNTER — Ambulatory Visit (INDEPENDENT_AMBULATORY_CARE_PROVIDER_SITE_OTHER): Payer: Medicare Other | Admitting: Family Medicine

## 2018-01-27 VITALS — BP 128/82 | HR 68 | Temp 98.1°F | Ht 64.0 in | Wt 229.2 lb

## 2018-01-27 DIAGNOSIS — F1010 Alcohol abuse, uncomplicated: Secondary | ICD-10-CM | POA: Diagnosis not present

## 2018-01-27 DIAGNOSIS — H3530 Unspecified macular degeneration: Secondary | ICD-10-CM

## 2018-01-27 DIAGNOSIS — Z23 Encounter for immunization: Secondary | ICD-10-CM

## 2018-01-27 DIAGNOSIS — E785 Hyperlipidemia, unspecified: Secondary | ICD-10-CM | POA: Diagnosis not present

## 2018-01-27 DIAGNOSIS — B372 Candidiasis of skin and nail: Secondary | ICD-10-CM | POA: Diagnosis not present

## 2018-01-27 MED ORDER — FLUTICASONE PROPIONATE HFA 44 MCG/ACT IN AERO: 1.0000 | INHALATION_SPRAY | Freq: Two times a day (BID) | RESPIRATORY_TRACT | 12 refills | Status: DC

## 2018-01-27 MED ORDER — CLOTRIMAZOLE-BETAMETHASONE 1-0.05 % EX CREA: 1.0000 "application " | TOPICAL_CREAM | Freq: Two times a day (BID) | CUTANEOUS | 0 refills | Status: DC

## 2018-01-27 NOTE — Patient Instructions (Signed)
Things look good.    Checking some labs today.  Use the Lotrisone cream twice a day for a week.  Only this by itself.    Stop after a week and switch to lamisil.  Alternate daily Lamisil with Nystatin powder.

## 2018-01-27 NOTE — Progress Notes (Signed)
Lisa [name] is a 62 y.o. female who presents to [place] today for a comprehensive physical examination:  CPE  Concerns: Only concern today is that she continues to have rash under her breasts and arms.  It is equally irritating under her breasts but worse in her right armpit.  She has tried nystatin powder as well as Lamisil cream.  Lamisil cream seems to help mostly with the itching but she stopped this on the recommendation of prior physician.  Started nystatin which does not seem to be helping much although it does keep the area dry.  No new contacts.  Other family members do not have a similar rash.  Last physical last year Tetanus up-to-date Flu vaccine up-to-date Eye exam: Yearly but it is been sometime since she is seeing ophthalmologist.  She does have macular degeneration and is asking for referral to ophthalmology. Dental exam every six months.   PMH reviewed. Patient is a nonsmoker.   Past Medical History:  Diagnosis Date  . Alcoholism ([place])    Chronic -- not interested in cutting back.  Has cut back from 12 beers daily in 2014.    Marland Kitchen Allergy   . Asthma   . GERD (gastroesophageal reflux disease)   . Hypertension   . Hypothyroidism    Past Surgical History:  Procedure Laterality Date  . COLONOSCOPY  2010   [facility] - Repeat in 10 years as normal findings     Medications reviewed. Current Outpatient Medications  Medication Sig Dispense Refill  . albuterol (VENTOLIN HFA) 108 (90 Base) MCG/ACT inhaler Inhale 2 puffs into the lungs every 6 (six) hours as needed. For wheezing 8 g 1  . aspirin (ADULT ASPIRIN EC LOW STRENGTH) 81 MG EC tablet Take 81 mg by mouth daily.      . cetirizine (ZYRTEC) 10 MG tablet TAKE 1 TABLET BY MOUTH ONCE DAILY 90 tablet 1  . clotrimazole-betamethasone (LOTRISONE) cream Apply 1 application topically 2 (two) times daily. 30 g 0  . esomeprazole (NEXIUM) 40 MG capsule take 1 capsule by mouth once daily AT 12 NOON 30 capsule 6  .  fluconazole (DIFLUCAN) 150 MG tablet Take 1 tablet (150 mg total) by mouth once a week. 4 tablet 0  . fluticasone (FLOVENT HFA) 44 MCG/ACT inhaler Inhale 1 puff into the lungs 2 (two) times daily. 1 Inhaler 12  . levothyroxine (SYNTHROID, LEVOTHROID) 25 MCG tablet take 1 tablet by mouth once daily 90 tablet 3  . losartan (COZAAR) 50 MG tablet Take 1 tablet (50 mg total) daily by mouth. 90 tablet 2  . metoprolol succinate (TOPROL-XL) 100 MG 24 hr tablet TAKE 1 TABLET BY MOUTH ONCE DAILY 90 tablet 2  . montelukast (SINGULAIR) 10 MG tablet Take 1 tablet (10 mg total) by mouth daily. 30 tablet 6  . nystatin (MYCOSTATIN/NYSTOP) powder Apply topically 4 (four) times daily. 60 g 3   No current facility-administered medications for this visit.     Social: Smoking history: Denies Alcohol use: She has now cut back to 3-4 beers a day.  Her husband is completely quit hopefully this will help with her cessation efforts as well. Illicit drug use: Denies Relationship status:   Married Occupation: Retired  Family History: No family history of ocular disease.  Review of Systems  Constitutional: Negative for fever.  HENT: Negative for congestion, ear discharge, ear pain and hearing loss.  Eyes: Negative for blurred vision.  Respiratory: Negative for cough and wheezing.   Cardiovascular: Negative  for chest pain, palpitations and leg swelling.  Gastrointestinal: Negative for nausea, vomiting and abdominal pain.  Genitourinary: Negative for dysuria, hematuria and flank pain.  Musculoskeletal: Negative for neck pain.  Skin: Rash as noted above. Neurological: Negative for dizziness and headaches.  Psychiatric/Behavioral: Negative for depression and suicidal ideas.   Exam: BP 128/82   Pulse 68   Temp 98.1 F (36.7 C) (Oral)   Ht 5\' 4"  (1.626 m)   Wt 229 lb 3.2 oz (104 kg)   SpO2 99%   BMI 39.34 kg/m  Gen:  Alert, cooperative patient who appears stated age in no acute distress.  Vital signs  reviewed. Head: [place]/AT.   Eyes:  EOMI, PERRL.   Ears:  External ears WNL, Bilateral TM's normal without retraction, redness or bulging. Nose:  Septum midline  Mouth:  MMM, tonsils non-erythematous, non-edematous.   Neck: No masses or thyromegaly or limitation in range of motion.  No cervical lymphadenopathy. Pulm:  Clear to auscultation bilaterally with good air movement.  No wheezes or rales noted.   Cardiac:  Regular rate and rhythm without murmur auscultated.  Good S1/S2. Abd:  Soft/nondistended/nontender.  Good bowel sounds throughout all four quadrants.  No masses noted.  Ext:  No clubbing/cyanosis/erythema.  No edema noted bilateral lower extremities. Skin: She does have intertrigo and inflamed/erythematous skin under both breasts bilaterally as well as her lateral armpits.  It is indeed more than erythematous under her right armpit.  Does appear to be Candida intertrigo with satellite lesions.  No other rash noted throughout. Neuro:  Grossly normal, no gait abnormalities Psych:  Not depressed or anxious appearing.  Conversant and engaged  Impression/Plan: 1. Complete Physical Examination: Doing well.  See problem list about rash 3.  Vaccines: Up-to-date 4.  Screening cholesterol: obtain FLP.

## 2018-01-28 ENCOUNTER — Encounter: Payer: Self-pay | Admitting: Family Medicine

## 2018-01-28 LAB — BASIC METABOLIC PANEL
BUN/Creatinine Ratio: 13 (ref 12–28)
BUN: 10 mg/dL (ref 8–27)
CO2: 22 mmol/L (ref 20–29)
Calcium: 10.1 mg/dL (ref 8.7–10.3)
Chloride: 104 mmol/L (ref 96–106)
Creatinine, Ser: 0.78 mg/dL (ref 0.57–1.00)
GFR calc Af Amer: 95 mL/min/{1.73_m2} (ref >59)
GFR calc non Af Amer: 82 mL/min/{1.73_m2} (ref >59)
Glucose: 87 mg/dL (ref 65–99)
Potassium: 4.3 mmol/L (ref 3.5–5.2)
Sodium: 143 mmol/L (ref 134–144)

## 2018-01-28 LAB — CBC
Hematocrit: 45.1 % (ref 34.0–46.6)
Hemoglobin: 15 g/dL (ref 11.1–15.9)
MCH: 31.4 pg (ref 26.6–33.0)
MCHC: 33.3 g/dL (ref 31.5–35.7)
MCV: 95 fL (ref 79–97)
Platelets: 278 10*3/uL (ref 150–450)
RBC: 4.77 x10E6/uL (ref 3.77–5.28)
RDW: 13.6 % (ref 12.3–15.4)
WBC: 11 10*3/uL — ABNORMAL HIGH (ref 3.4–10.8)

## 2018-01-28 LAB — LIPID PANEL
Chol/HDL Ratio: 3.5 ratio (ref 0.0–4.4)
Cholesterol, Total: 210 mg/dL — ABNORMAL HIGH (ref 100–199)
HDL: 60 mg/dL (ref >39)
LDL Calculated: 116 mg/dL — ABNORMAL HIGH (ref 0–99)
Triglycerides: 169 mg/dL — ABNORMAL HIGH (ref 0–149)
VLDL Cholesterol Cal: 34 mg/dL (ref 5–40)

## 2018-01-29 ENCOUNTER — Encounter: Payer: Self-pay | Admitting: Family Medicine

## 2018-01-29 DIAGNOSIS — B372 Candidiasis of skin and nail: Secondary | ICD-10-CM | POA: Insufficient documentation

## 2018-01-29 NOTE — Assessment & Plan Note (Signed)
Referral to ophthalmology today.  She would just like to have any recommendations for anything else she can do to help with her eyesight.

## 2018-01-29 NOTE — Assessment & Plan Note (Signed)
Fairly inflamed on exam.  Going to send in 1 weeks worth of Lotrisone, mostly just to get ahead of her inflammation and itching as she is so uncomfortable.  Please see AVS for full details.  At that point we will switch to Lamisil.  I do agree that powder would likely work into these moist areas but she was experiencing more relief with the Lamisil rather than nystatin we will continue with this.  In 4 months to assess for improvement.

## 2018-01-29 NOTE — Assessment & Plan Note (Signed)
Checking lipid panel today. 

## 2018-01-29 NOTE — Assessment & Plan Note (Signed)
Improving.  As above hopeful that her husband cessation will lead to her continuing to cut back.

## 2018-02-11 ENCOUNTER — Telehealth: Payer: Self-pay

## 2018-02-11 NOTE — Telephone Encounter (Signed)
Pt called nurse line and left VM wanting her insurance information updated. Will forward to admin.

## 2018-02-25 ENCOUNTER — Other Ambulatory Visit: Payer: Self-pay | Admitting: Family Medicine

## 2018-02-26 ENCOUNTER — Other Ambulatory Visit: Payer: Self-pay | Admitting: Family Medicine

## 2018-03-04 ENCOUNTER — Other Ambulatory Visit: Payer: Self-pay | Admitting: Family Medicine

## 2018-03-11 ENCOUNTER — Other Ambulatory Visit: Payer: Self-pay | Admitting: Family Medicine

## 2018-03-16 ENCOUNTER — Other Ambulatory Visit: Payer: Self-pay | Admitting: Family Medicine

## 2018-07-29 ENCOUNTER — Ambulatory Visit (INDEPENDENT_AMBULATORY_CARE_PROVIDER_SITE_OTHER): Payer: Medicare Other | Admitting: Family Medicine

## 2018-07-29 ENCOUNTER — Other Ambulatory Visit: Payer: Self-pay

## 2018-07-29 ENCOUNTER — Encounter: Payer: Self-pay | Admitting: Family Medicine

## 2018-07-29 VITALS — BP 100/58 | HR 81 | Temp 98.6°F | Ht 64.0 in | Wt 233.8 lb

## 2018-07-29 DIAGNOSIS — M25562 Pain in left knee: Secondary | ICD-10-CM | POA: Diagnosis not present

## 2018-07-29 DIAGNOSIS — G8929 Other chronic pain: Secondary | ICD-10-CM

## 2018-07-29 DIAGNOSIS — M25561 Pain in right knee: Secondary | ICD-10-CM | POA: Diagnosis not present

## 2018-07-29 DIAGNOSIS — Z0000 Encounter for general adult medical examination without abnormal findings: Secondary | ICD-10-CM

## 2018-07-29 NOTE — Progress Notes (Signed)
Lisa [name] is a 63 y.o. female who presents to [place] today for a comprehensive physical examination:  CPE  Concerns:  Bilateral knee pain.  This has become a chronic issue since last time I saw her.  Present for several months.  Worse with prolonged standing and going up stairs.  No falls.  No injuries.  Knees intermittently swollen.  Has tried anything for relief.    Last physical last year Tetanus up to date Flu vaccine this year Eye exam:  Regularly.  Has macular degeneration Dental exam every six months.   PMH reviewed. Patient is a nonsmoker.   Past Medical History:  Diagnosis Date  . Alcoholism ([place])    Chronic -- not interested in cutting back.  Has cut back from 12 beers daily in 2014.    Marland Kitchen Allergy   . Asthma   . GERD (gastroesophageal reflux disease)   . Hypertension   . Hypothyroidism    Past Surgical History:  Procedure Laterality Date  . COLONOSCOPY  2010   [facility] - Repeat in 10 years as normal findings     Medications reviewed. Current Outpatient Medications  Medication Sig Dispense Refill  . albuterol (PROVENTIL HFA;VENTOLIN HFA) 108 (90 Base) MCG/ACT inhaler INHALE 2 PUFFS BY MOUTH EVERY 6 HOURS IF NEEDED FOR WHEEZING 54 g 0  . aspirin (ADULT ASPIRIN EC LOW STRENGTH) 81 MG EC tablet Take 81 mg by mouth daily.      . cetirizine (ZYRTEC) 10 MG tablet TAKE 1 TABLET BY MOUTH ONCE DAILY 90 tablet 1  . clotrimazole-betamethasone (LOTRISONE) cream Apply 1 application topically 2 (two) times daily. 30 g 0  . esomeprazole (NEXIUM) 40 MG capsule TAKE 1 CAPSULE BY MOUTH ONCE DAILY AT NOON 210 capsule 0  . fluconazole (DIFLUCAN) 150 MG tablet Take 1 tablet (150 mg total) by mouth once a week. 4 tablet 0  . fluticasone (FLOVENT HFA) 44 MCG/ACT inhaler Inhale 1 puff into the lungs 2 (two) times daily. 1 Inhaler 12  . levothyroxine (SYNTHROID, LEVOTHROID) 25 MCG tablet TAKE 1 TABLET BY MOUTH ONCE DAILY 90 tablet 1  . losartan (COZAAR) 50 MG tablet TAKE 1  TABLET BY MOUTH ONCE DAILY 90 tablet 3  . metoprolol succinate (TOPROL-XL) 100 MG 24 hr tablet TAKE 1 TABLET BY MOUTH ONCE DAILY 90 tablet 2  . montelukast (SINGULAIR) 10 MG tablet TAKE 1 TABLET(10 MG) BY MOUTH DAILY 90 tablet 2  . nystatin (MYCOSTATIN/NYSTOP) powder Apply topically 4 (four) times daily. 60 g 3   No current facility-administered medications for this visit.     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    Review of Systems  Constitutional: Negative for fever.  HENT: Negative for congestion, ear discharge, ear pain and hearing loss.   Eyes: Negative for blurred vision.  Respiratory: Negative for cough and wheezing.   Cardiovascular: Negative for chest pain, palpitations and leg swelling.  Gastrointestinal: Negative for nausea, vomiting and abdominal pain.  Genitourinary: Negative for dysuria, hematuria and flank pain.  Musculoskeletal: Negative for neck pain.  Skin: Negative for rash.  Neurological: Negative for dizziness and headaches.  Psychiatric/Behavioral: Negative for depression and suicidal ideas.   Exam: BP (!) 100/58   Pulse 81   Temp 98.6 F (37 C) (Oral)  Ht 5\' 4"  (1.626 m)   Wt 233 lb 12.8 oz (106.1 kg)   SpO2 97%   BMI 40.13 kg/m  Gen:  Alert, cooperative patient who appears stated age in no acute distress.  Vital signs reviewed. Head: [place]/AT.   Eyes:  EOMI, PERRL.   Ears:  External ears WNL, Bilateral TM's normal without retraction, redness or bulging. Nose:  Septum midline  Mouth:  MMM, tonsils non-erythematous, non-edematous.   Neck: No masses or thyromegaly or limitation in range of motion.  No cervical lymphadenopathy. Pulm:  Clear to auscultation bilaterally with good air movement.  No wheezes or rales noted.   Cardiac:  Regular rate and rhythm  without murmur auscultated.  Good S1/S2. Abd:  Soft/nondistended/nontender.  Good bowel sounds throughout all four quadrants.  No masses noted.  Ext:  No clubbing/cyanosis/erythema.  MSK:  TTP along medial joint lines BL, RIght more so than Left.  No joint laxity.  Some tenderness on RIght with varus manipulation, otherwise negative testing.   Neuro:  Grossly normal, no gait abnormalities Psych:  Not depressed or anxious appearing.  Conversant and engaged  Impression/Plan: 1. Complete Physical Examination: anticipatory guidance provided.   2.  BL knee pain:  Patient with chronic knee pain.  Mild to moderate currently.  Sending for BL standing knee xrays to look for presumed OA.  No ligamentous concerns, etc.  Plan to treat based on results of xrays.

## 2018-07-29 NOTE — Patient Instructions (Signed)
Go for the knee xrays sometime this week.  I'll let you know the results.  It was great to see you today, and great to have taken care of you!

## 2018-07-31 ENCOUNTER — Encounter: Payer: Self-pay | Admitting: Family Medicine

## 2018-07-31 ENCOUNTER — Ambulatory Visit
Admission: RE | Admit: 2018-07-31 | Discharge: 2018-07-31 | Disposition: A | Discharge status: Home / Self Care | Payer: Medicare Other | Source: Ambulatory Visit | Attending: Family Medicine | Admitting: Family Medicine

## 2018-07-31 ENCOUNTER — Other Ambulatory Visit: Payer: Self-pay

## 2018-07-31 DIAGNOSIS — M1712 Unilateral primary osteoarthritis, left knee: Secondary | ICD-10-CM | POA: Diagnosis not present

## 2018-07-31 DIAGNOSIS — M25561 Pain in right knee: Principal | ICD-10-CM

## 2018-07-31 DIAGNOSIS — M25562 Pain in left knee: Principal | ICD-10-CM

## 2018-07-31 DIAGNOSIS — M1711 Unilateral primary osteoarthritis, right knee: Secondary | ICD-10-CM | POA: Diagnosis not present

## 2018-07-31 DIAGNOSIS — G8929 Other chronic pain: Secondary | ICD-10-CM

## 2018-08-04 ENCOUNTER — Telehealth: Payer: Self-pay | Admitting: *Deleted

## 2018-08-04 NOTE — Telephone Encounter (Signed)
Patient would like to know the results of her xrays. Christen Bame, CMA

## 2018-08-05 NOTE — Telephone Encounter (Signed)
**  Late note:  Called and spoke with patient yesterday.   Discussed xrays showed mild degeneration.  OTC analgesics for now for pain relief.  Discussed potential corticosteroid injection in future for any improvement.  She declined PT for now.  FU in clinic in 2-3 months.

## 2018-08-27 ENCOUNTER — Other Ambulatory Visit: Payer: Self-pay | Admitting: Family Medicine

## 2018-08-31 ENCOUNTER — Telehealth: Payer: Self-pay | Admitting: Family Medicine

## 2018-08-31 NOTE — Telephone Encounter (Signed)
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

## 2018-09-16 ENCOUNTER — Other Ambulatory Visit: Payer: Self-pay | Admitting: Family Medicine

## 2018-10-09 ENCOUNTER — Other Ambulatory Visit: Payer: Self-pay | Admitting: Family Medicine

## 2018-10-16 ENCOUNTER — Encounter: Payer: Self-pay | Admitting: Family Medicine

## 2018-10-16 ENCOUNTER — Other Ambulatory Visit: Payer: Self-pay

## 2018-10-16 ENCOUNTER — Ambulatory Visit (INDEPENDENT_AMBULATORY_CARE_PROVIDER_SITE_OTHER): Payer: Medicare Other | Admitting: Family Medicine

## 2018-10-16 ENCOUNTER — Ambulatory Visit: Payer: Medicare Other

## 2018-10-16 VITALS — BP 128/80 | HR 63

## 2018-10-16 DIAGNOSIS — M1710 Unilateral primary osteoarthritis, unspecified knee: Secondary | ICD-10-CM | POA: Insufficient documentation

## 2018-10-16 DIAGNOSIS — Z6841 Body Mass Index (BMI) 40.0 and over, adult: Secondary | ICD-10-CM

## 2018-10-16 DIAGNOSIS — M25561 Pain in right knee: Secondary | ICD-10-CM

## 2018-10-16 DIAGNOSIS — G8929 Other chronic pain: Secondary | ICD-10-CM | POA: Diagnosis not present

## 2018-10-16 MED ORDER — METHYLPREDNISOLONE ACETATE 40 MG/ML IJ SUSP
40.0000 mg | Freq: Once | INTRAMUSCULAR | Status: AC
  Administered 2018-10-16: 40 mg via INTRA_ARTICULAR

## 2018-10-16 NOTE — Assessment & Plan Note (Signed)
>>  ASSESSMENT AND PLAN FOR CHRONIC PAIN OF RIGHT KNEE WRITTEN ON 10/16/2018 10:29 AM BY RUMBALL, ALISON, DO  Progressively worsened with standing and walking. Prior standing AP XR with mild degeneration. Likely related to degenerative arthritic changes. No history or PE findings concerning for infection, gout, fracture, DVT, tumor, compartment syndrome. Also believe obesity contributing, counseled on lifestyle interventions utilizing diet and exercise. Steroid injection given today with relief and no immediate complications. Believe this will hopefully help her become more active as well. Return precautions reviewed, see AVS.

## 2018-10-16 NOTE — Patient Instructions (Signed)
It was great to see you!  Our plans for today:  - Continue to take tylenol and ibuprofen as needed for pain. - If you develop redness, swelling, fever, or discharge from where we injected you today, let us know.  Take care and seek immediate care sooner if you develop any concerns.   Dr. [facility]

## 2018-10-16 NOTE — Assessment & Plan Note (Signed)
Progressively worsened with standing and walking. Prior standing AP XR with mild degeneration. Likely related to degenerative arthritic changes. No history or PE findings concerning for infection, gout, fracture, DVT, tumor, compartment syndrome. Also believe obesity contributing, counseled on lifestyle interventions utilizing diet and exercise. Steroid injection given today with relief and no immediate complications. Believe this will hopefully help her become more active as well. Return precautions reviewed, see AVS.

## 2018-10-16 NOTE — Progress Notes (Signed)
  Subjective:   Patient ID: Lisa [name]    DOB: [birth year], 63 y.o. female   MRN: [id number]  Lisa [name] is a 63 y.o. female with a history of HTN, allergies, asthma, GERD, hypothyroidism, chondromalacia of b/l patellae, obesity, HLD, alcohol use, macular degeneration here for   Knee pain - bilateral, R>L, previously been seen for same and told could get injections.  - Standing AP knee XR with mild degenerative changes. - nothing makes better. Has tried tylenol, ibuprofen, pain cream but is not helping recently. - has h/o gout, previously had in foot - walking and standing makes worse. - can't lay on R side b/c pain - no redness, swelling, fevers - no trauma - works at [employer], now working more hours, 8 hours at a time. Every time put weight on it, it would pop. - can still do ADLs, but now with more pain.  Review of Systems:  Per HPI.  [place], medications and smoking status reviewed.  Objective:   BP 128/80   Pulse 63   SpO2 99%  Vitals and nursing note reviewed.  General: obese female, in no acute distress with non-toxic appearance Skin: warm, dry, no rashes or lesions MSK: ROM grossly intact, 4/5 quadricept strength on the R, 5/5 on the L, gait normal. Pain with general PROM of R knee. No crepitus appreciated. Negative anterior/posterior drawer. No joint laxity. Negative FABER, FADIR of bilateral hips. Neuro: Alert and oriented, speech normal  Right Knee INJECTION: Patient was given informed consent, signed copy in the chart. Appropriate time out was taken. Area prepped and draped in usual sterile fashion. 1 cc of methylprednisolone 40 mg/ml plus  4 cc of 1% lidocaine without epinephrine was injected into the right knee using a(n) anterior medial approach. The patient tolerated the procedure well. There were no complications. Post procedure instructions were given.  Assessment & Plan:   Chronic pain of right knee Progressively worsened with standing and walking.  Prior standing AP XR with mild degeneration. Likely related to degenerative arthritic changes. No history or PE findings concerning for infection, gout, fracture, DVT, tumor, compartment syndrome. Also believe obesity contributing, counseled on lifestyle interventions utilizing diet and exercise. Steroid injection given today with relief and no immediate complications. Believe this will hopefully help her become more active as well. Return precautions reviewed, see AVS.    OBESITY, NOS Contributing to bilateral knee pain and degenerative changes, counseled on lifestyle interventions utilizing diet and exercise.  No orders of the defined types were placed in this encounter.  Meds ordered this encounter  Medications  . methylPREDNISolone acetate (DEPO-MEDROL) injection 40 mg    Rory Percy, DO PGY-2, [facility] 10/16/2018 10:31 AM

## 2018-10-16 NOTE — Assessment & Plan Note (Signed)
Contributing to bilateral knee pain and degenerative changes, counseled on lifestyle interventions utilizing diet and exercise.

## 2018-11-27 ENCOUNTER — Other Ambulatory Visit: Payer: Self-pay

## 2018-11-27 MED ORDER — MONTELUKAST SODIUM 10 MG PO TABS: ORAL_TABLET | ORAL | 2 refills | Status: DC

## 2018-12-08 ENCOUNTER — Other Ambulatory Visit: Payer: Self-pay

## 2018-12-08 ENCOUNTER — Ambulatory Visit (INDEPENDENT_AMBULATORY_CARE_PROVIDER_SITE_OTHER): Payer: Medicare Other | Admitting: Family Medicine

## 2018-12-08 ENCOUNTER — Encounter: Payer: Self-pay | Admitting: Family Medicine

## 2018-12-08 VITALS — BP 122/74 | HR 57

## 2018-12-08 DIAGNOSIS — G8929 Other chronic pain: Secondary | ICD-10-CM

## 2018-12-08 DIAGNOSIS — E785 Hyperlipidemia, unspecified: Secondary | ICD-10-CM

## 2018-12-08 DIAGNOSIS — E039 Hypothyroidism, unspecified: Secondary | ICD-10-CM

## 2018-12-08 DIAGNOSIS — M25561 Pain in right knee: Secondary | ICD-10-CM

## 2018-12-08 DIAGNOSIS — Z1239 Encounter for other screening for malignant neoplasm of breast: Secondary | ICD-10-CM

## 2018-12-08 NOTE — Assessment & Plan Note (Signed)
Counseled patient that gel shots can be helpful, although insurance often does not want to pay for these.  However, if she is interested, I will be happy to make a referral to an Ortho office that performs these.  We discussed that she may need a knee replacement down the line if her pain becomes unbearable, but until then, staying active and eating healthily will reduce the pain that she experiences on her right knee through weight loss.

## 2018-12-08 NOTE — Assessment & Plan Note (Signed)
Will check lipid panel today 

## 2018-12-08 NOTE — Progress Notes (Signed)
   Subjective:    Lisa [name] - 63 y.o. female MRN [id number]  Date of birth: December 10, 1955  CC:  Lisa [name] is here for follow up of knee pain and healthcare maintenance.  HPI:  Right knee pain Ms. Dorsi says that her right knee pain was alleviated partially by the hydrocortisone shot she received in late May.  Her pain occurs along the medial joint line.  She stays active cleaning houses and working at [employer] and is tolerating her knee pain.  She is interested in getting gel shots if possible in the future.  Health Maintenance:  -Mammogram ordered today Health Maintenance Due  Topic Date Due  . MAMMOGRAM  10/12/2018    -  reports that she has quit smoking. She quit smokeless tobacco use about 31 years ago. - Review of Systems: Per HPI. - Past Medical History: Patient Active Problem List   Diagnosis Date Noted  . Chronic pain of right knee 10/16/2018  . Candidal intertrigo 01/29/2018  . Memory change 01/24/2017  . Tinnitus 08/01/2015  . Bunion of great toe 02/07/2015  . Macular degeneration 03/02/2013  . Chondromalacia of both patellae 10/14/2012  . Hyperlipidemia with target LDL less than 130 03/06/2012  . Asthma, chronic 08/28/2009  . Allergic rhinitis 01/28/2008  . LACTOSE INTOLERANCE 09/02/2006  . Hypothyroidism 07/17/2006  . OBESITY, NOS 07/17/2006  . Alcohol abuse 07/17/2006  . HYPERTENSION, BENIGN SYSTEMIC 07/17/2006  . GASTROESOPHAGEAL REFLUX, NO ESOPHAGITIS 07/17/2006  . MENOPAUSAL SYNDROME 07/17/2006   - Medications: reviewed and updated   Objective:   Physical Exam BP 122/74   Pulse (!) 57   SpO2 98%  Gen: NAD, alert, cooperative with exam, well-appearing, pleasant, obese HEENT: NCAT, clear conjunctiva, supple neck CV: Mildly bradycardic, regular rhythm, good S1/S2, no murmur, no edema  Resp: CTABL, no wheezes, non-labored Abd: SNTND, BS present, no guarding or organomegaly Skin: no rashes, normal turgor  Neuro: no gross deficits.  Psych:  good insight, alert and oriented        Assessment & Plan:   Hypothyroidism Continues to take levothyroxine 50 mcg daily.  Will check a TSH today.  Hyperlipidemia with target LDL less than 130 Will check lipid panel today.  Chronic pain of right knee Counseled patient that gel shots can be helpful, although insurance often does not want to pay for these.  However, if she is interested, I will be happy to make a referral to an Ortho office that performs these.  We discussed that she may need a knee replacement down the line if her pain becomes unbearable, but until then, staying active and eating healthily will reduce the pain that she experiences on her right knee through weight loss.    Maia Breslow, M.D. 12/08/2018, 9:12 AM PGY-3, [facility]

## 2018-12-08 NOTE — Patient Instructions (Addendum)
It was nice meeting you today Ms. Deen!  Today, we checked your TSH level for your thyroid and your cholesterol level.  We will let you know those results once they return.  I will see you back in about 6 months.  Please call to make the appointment for your mammogram at your earliest convenience.  If you have any questions or concerns, please feel free to call the clinic.   Be well,  Dr. Shan Levans

## 2018-12-08 NOTE — Assessment & Plan Note (Signed)
>>  ASSESSMENT AND PLAN FOR CHRONIC PAIN OF RIGHT KNEE WRITTEN ON 12/08/2018  9:12 AM BY WINFREY, AMANDA C, MD  Counseled patient that gel shots can be helpful, although insurance often does not want to pay for these.  However, if she is interested, I will be happy to make a referral to an Ortho office that performs these.  We discussed that she may need a knee replacement down the line if her pain becomes unbearable, but until then, staying active and eating healthily will reduce the pain that she experiences on her right knee through weight loss.

## 2018-12-08 NOTE — Assessment & Plan Note (Signed)
Continues to take levothyroxine 50 mcg daily.  Will check a TSH today.

## 2018-12-09 LAB — LIPID PANEL
Chol/HDL Ratio: 3.6 ratio (ref 0.0–4.4)
Cholesterol, Total: 208 mg/dL — ABNORMAL HIGH (ref 100–199)
HDL: 58 mg/dL (ref >39)
LDL Calculated: 112 mg/dL — ABNORMAL HIGH (ref 0–99)
Triglycerides: 190 mg/dL — ABNORMAL HIGH (ref 0–149)
VLDL Cholesterol Cal: 38 mg/dL (ref 5–40)

## 2018-12-09 LAB — TSH: TSH: 3.45 u[IU]/mL (ref 0.450–4.500)

## 2018-12-15 ENCOUNTER — Encounter: Payer: Self-pay | Admitting: Family Medicine

## 2018-12-15 ENCOUNTER — Telehealth: Payer: Self-pay | Admitting: Family Medicine

## 2018-12-15 NOTE — Telephone Encounter (Signed)
I left a voicemail on patient's home number telling her that her TSH level was normal, so we do not need to adjust her thyroid medication.  Also told her that her cholesterol is a little bit elevated, but since she does not have diabetes, does not smoke, and had well-controlled blood pressure at her last visit, her ASCVD risk is only 4% in the next 10 years.  Told her that lifestyle modification is the best first step in controlling her cholesterol, but we can always add on a lipid lowering medication if she would like.  We will also continue to monitor this yearly.

## 2019-01-20 ENCOUNTER — Ambulatory Visit (INDEPENDENT_AMBULATORY_CARE_PROVIDER_SITE_OTHER): Payer: Medicare Other | Admitting: Family Medicine

## 2019-01-20 ENCOUNTER — Encounter: Payer: Self-pay | Admitting: Family Medicine

## 2019-01-20 ENCOUNTER — Other Ambulatory Visit: Payer: Self-pay

## 2019-01-20 VITALS — BP 132/84 | HR 64 | Wt 227.0 lb

## 2019-01-20 DIAGNOSIS — M1710 Unilateral primary osteoarthritis, unspecified knee: Secondary | ICD-10-CM | POA: Diagnosis not present

## 2019-01-20 DIAGNOSIS — Z1211 Encounter for screening for malignant neoplasm of colon: Secondary | ICD-10-CM

## 2019-01-20 DIAGNOSIS — Z23 Encounter for immunization: Secondary | ICD-10-CM

## 2019-01-20 MED ORDER — METHYLPREDNISOLONE ACETATE 40 MG/ML IJ SUSP
40.0000 mg | Freq: Once | INTRAMUSCULAR | Status: AC
  Administered 2019-01-20: 40 mg via INTRAMUSCULAR

## 2019-01-20 NOTE — Patient Instructions (Signed)
It was a pleasure to see you today! Thank you for choosing [facility] for your primary care. MARISSA RASSEL was seen for knee injection. Come back to the clinic if it starts to hurt again in a few months.   Today we gave you a steroid shot in both of her knees.  We are glad that the last one worked for 2-1/2 months.  As discussed there is a small chance of bleeding or infection so if anything seems to be concerning to let us know.  Assuming that this does work as well as the one did last time with starts to wear off in a few months call us back we can discuss possibility of another shot.  We also ordered a Cologuard test for your colon cancer screening, if you do not hear from someone about this in the next week or 2 please call back and check with [place].   Please bring all your medications to every doctors visit   Sign up for My Chart to have easy access to your labs results, and communication with your Primary care physician.     Please check-out at the front desk before leaving the clinic.     Best,  Dr. Sherene Sires FAMILY MEDICINE RESIDENT - PGY3 01/20/2019 10:02 AM

## 2019-01-22 DIAGNOSIS — M1710 Unilateral primary osteoarthritis, unspecified knee: Secondary | ICD-10-CM | POA: Insufficient documentation

## 2019-01-22 DIAGNOSIS — Z23 Encounter for immunization: Secondary | ICD-10-CM | POA: Insufficient documentation

## 2019-01-22 DIAGNOSIS — Z1211 Encounter for screening for malignant neoplasm of colon: Secondary | ICD-10-CM | POA: Insufficient documentation

## 2019-01-22 NOTE — Assessment & Plan Note (Signed)
Chronic arthritis of both knees, confirmed by imaging, did get roughly 2-1/2 months of relief with prior steroid injection to the right knee.  Would like to get both knees done today.  Did discuss that this was a symptom control and not a treatment for arthritis as well as possibility for complications.  Patient consents would like injection today.  Bilateral knee injections using 4-1 methylprednisolone and lidocaine.

## 2019-01-22 NOTE — Assessment & Plan Note (Signed)
Patient consents to flu shot 

## 2019-01-22 NOTE — Progress Notes (Signed)
    Subjective:  Lisa [name] is a 63 y.o. female who presents to the [facility] today with a chief complaint of chronic knee arthritis.   HPI: Arthritis of knee Chronic arthritis of both knees, confirmed by imaging, did get roughly 2-1/2 months of relief with prior steroid injection to the right knee.  Would like to get both knees done today.  No recent injuries although patient did have a mechanical fall when she tripped on the floor.  No significant change in function, it is worse in the morning until she gets going.  Did discuss that this was a symptom control and not a treatment for arthritis as well as possibility for complications.  Patient consents would like injection today.  Colon cancer screening Patient with prior colonoscopy with no concerning findings, is overdue for 1 now but would prefer to do Cologuard.  "  Please do not make me drink that stuff again".  No symptoms noted  Need for immunization against influenza Patient consents to flu shot  Objective:  Physical Exam: BP 132/84   Pulse 64   Wt 227 lb (103 kg)   SpO2 98%   BMI 38.96 kg/m   Gen: NAD, resting comfortably CV: Regular rate Pulm: NWOB MSK: Palpable crepitus to both knees on extension and flexion.  No erythema, warmth, obvious wounding, signs of infection Skin: warm, dry Neuro: grossly normal, moves all extremities Psych: Normal affect and thought content  No results found for this or any previous visit (from the past 72 hour(s)).   Assessment/Plan:  Colon cancer screening Patient with prior colonoscopy with no concerning findings, is overdue for 1 now but would prefer to do Cologuard  Cologuard ordered, no current symptoms concerning  Need for immunization against influenza Patient consents to flu shot  Arthritis of knee Chronic arthritis of both knees, confirmed by imaging, did get roughly 2-1/2 months of relief with prior steroid injection to the right knee.  Would like to get both knees done  today.  Did discuss that this was a symptom control and not a treatment for arthritis as well as possibility for complications.  Patient consents would like injection today.  Bilateral knee injections using 4-1 methylprednisolone and lidocaine.    Knee Injection: (bilateral) Written and verbal consent was obtained after discussing the risks and benefits of the procedure with the patient. The anterior knee was cleansed with alcohol swabs. 40 mg Depo-medrol and 4 cc 1% Lidocaine was injected using an antero medial approach using a 5 cc syringe and 25 gauge 11/2 in needle. No complications were encountered. Minimal blood loss. A band aid was applied.    Sherene Sires, DO FAMILY MEDICINE RESIDENT - PGY3 01/22/2019 9:18 AM

## 2019-01-22 NOTE — Assessment & Plan Note (Signed)
Patient with prior colonoscopy with no concerning findings, is overdue for 1 now but would prefer to do Cologuard  Cologuard ordered, no current symptoms concerning

## 2019-02-02 ENCOUNTER — Ambulatory Visit: Payer: Medicare Other

## 2019-02-18 LAB — COLOGUARD: Cologuard: NEGATIVE

## 2019-02-19 ENCOUNTER — Other Ambulatory Visit: Payer: Self-pay

## 2019-02-22 MED ORDER — LEVOTHYROXINE SODIUM 25 MCG PO TABS: 25.0000 ug | ORAL_TABLET | Freq: Every day | ORAL | 3 refills | Status: DC

## 2019-03-11 ENCOUNTER — Other Ambulatory Visit: Payer: Self-pay | Admitting: *Deleted

## 2019-03-11 MED ORDER — LOSARTAN POTASSIUM 50 MG PO TABS: 50.0000 mg | ORAL_TABLET | Freq: Every day | ORAL | 3 refills | Status: DC

## 2019-03-16 ENCOUNTER — Ambulatory Visit
Admission: RE | Admit: 2019-03-16 | Discharge: 2019-03-16 | Disposition: A | Discharge status: Home / Self Care | Payer: Medicare Other | Source: Ambulatory Visit | Attending: Family Medicine | Admitting: Family Medicine

## 2019-03-16 ENCOUNTER — Other Ambulatory Visit: Payer: Self-pay

## 2019-03-16 DIAGNOSIS — Z1239 Encounter for other screening for malignant neoplasm of breast: Secondary | ICD-10-CM

## 2019-04-22 ENCOUNTER — Other Ambulatory Visit: Payer: Self-pay

## 2019-04-23 MED ORDER — ESOMEPRAZOLE MAGNESIUM 40 MG PO CPDR: DELAYED_RELEASE_CAPSULE | ORAL | 0 refills | Status: DC

## 2019-05-18 ENCOUNTER — Other Ambulatory Visit: Payer: Self-pay

## 2019-05-18 MED ORDER — METOPROLOL SUCCINATE ER 100 MG PO TB24: 100.0000 mg | ORAL_TABLET | Freq: Every day | ORAL | 2 refills | Status: DC

## 2019-07-21 ENCOUNTER — Encounter: Payer: Self-pay | Admitting: Family Medicine

## 2019-07-21 ENCOUNTER — Ambulatory Visit (INDEPENDENT_AMBULATORY_CARE_PROVIDER_SITE_OTHER): Payer: Medicare Other | Admitting: Family Medicine

## 2019-07-21 ENCOUNTER — Other Ambulatory Visit: Payer: Self-pay

## 2019-07-21 VITALS — BP 136/88 | HR 63 | Ht 64.0 in | Wt 229.4 lb

## 2019-07-21 DIAGNOSIS — E785 Hyperlipidemia, unspecified: Secondary | ICD-10-CM | POA: Diagnosis not present

## 2019-07-21 DIAGNOSIS — Z1211 Encounter for screening for malignant neoplasm of colon: Secondary | ICD-10-CM

## 2019-07-21 DIAGNOSIS — M1710 Unilateral primary osteoarthritis, unspecified knee: Secondary | ICD-10-CM

## 2019-07-21 DIAGNOSIS — J988 Other specified respiratory disorders: Secondary | ICD-10-CM | POA: Diagnosis not present

## 2019-07-21 DIAGNOSIS — J398 Other specified diseases of upper respiratory tract: Secondary | ICD-10-CM | POA: Insufficient documentation

## 2019-07-21 DIAGNOSIS — I10 Essential (primary) hypertension: Secondary | ICD-10-CM | POA: Diagnosis not present

## 2019-07-21 MED ORDER — IPRATROPIUM BROMIDE 0.03 % NA SOLN: 2.0000 | Freq: Two times a day (BID) | NASAL | 0 refills | Status: DC

## 2019-07-21 NOTE — Assessment & Plan Note (Signed)
Patient counseled to repeat FIT testing later this year.

## 2019-07-21 NOTE — Assessment & Plan Note (Signed)
Slightly elevated but does not necessitate medication changes currently.  Will obtain BMP today and follow up at her appointment for knee injections.

## 2019-07-21 NOTE — Assessment & Plan Note (Signed)
Counseled patient to continue taking symptomatic therapy as she is doing and to give her symptoms time to improve.  We will add Atrovent nasal spray to see if this will help.  No signs of pneumonia or bacterial sinusitis today.

## 2019-07-21 NOTE — Progress Notes (Signed)
    SUBJECTIVE:   CHIEF COMPLAINT / HPI:   Congestion Started one week ago with a sore throat Had a runny nose with congestion soon after Has a cough now, feels like there is congestion in her chest Has tried Mucinex and Delcin cough syrup without much improvement Tried Afrin for the first few days No shortness of breath, no fever  Knee arthritis Hurts after days when she is on her feet a lot, which is frequent Last corticosteroid shots were in September Would like to make an appointment to get injections soon   PERTINENT  PMH / PSH: hyperlipidemia, obesity, macular degeneration, osteoarthritis of the knee  OBJECTIVE:   BP 136/88   Pulse 63   Ht 5\' 4"  (1.626 m)   Wt 229 lb 6.4 oz (104.1 kg)   SpO2 97%   BMI 39.38 kg/m   General: well appearing, appears stated age 64: tympanic membranes clear bilaterally, no oropharyngeal exudate but mild erythema Cardiac: RRR, no MRG Respiratory: CTAB, no rhonchi, rales, or wheezing, normal work of breathing Skin: no rashes or other lesions, warm and well perfused Psych: appropriate mood and affect   ASSESSMENT/PLAN:   HYPERTENSION, BENIGN SYSTEMIC Slightly elevated but does not necessitate medication changes currently.  Will obtain BMP today and follow up at her appointment for knee injections.  Hyperlipidemia with target LDL less than 130 Will obtain lipid panel today due to elevation in total cholesterol, LDL, and TGs at her last measurement in July 2020.  Will calculate ASCVD risk to determine if patient would benefit from statin after we obtain these results.  Colon cancer screening Patient counseled to repeat FIT testing later this year.  Congestion of upper respiratory tract Counseled patient to continue taking symptomatic therapy as she is doing and to give her symptoms time to improve.  We will add Atrovent nasal spray to see if this will help.  No signs of pneumonia or bacterial sinusitis today.  Arthritis of  knee Patient plans to make an appointment for bilateral corticosteroid knee injections.     Kathrene Alu, MD [place]

## 2019-07-21 NOTE — Assessment & Plan Note (Signed)
Patient plans to make an appointment for bilateral corticosteroid knee injections.

## 2019-07-21 NOTE — Assessment & Plan Note (Signed)
Will obtain lipid panel today due to elevation in total cholesterol, LDL, and TGs at her last measurement in July 2020.  Will calculate ASCVD risk to determine if patient would benefit from statin after we obtain these results.

## 2019-07-21 NOTE — Patient Instructions (Addendum)
It was nice seeing you today Ms. Grenz!  Please make an appointment to get your knee injections soon.  I am happy to do them, but you can also make an appointment with another provider if needed.  I have sent in ipratropium nose spray that you can use twice daily to help with your cold symptoms.  Please get your Covid shot as soon as you can.  You will need to do your Cologuard again at the end of this year.  If you have any questions or concerns, please feel free to call the clinic.   Be well,  Dr. Shan Levans

## 2019-07-22 LAB — LIPID PANEL
Chol/HDL Ratio: 3.4 ratio (ref 0.0–4.4)
Cholesterol, Total: 215 mg/dL — ABNORMAL HIGH (ref 100–199)
HDL: 63 mg/dL (ref >39)
LDL Chol Calc (NIH): 124 mg/dL — ABNORMAL HIGH (ref 0–99)
Triglycerides: 163 mg/dL — ABNORMAL HIGH (ref 0–149)
VLDL Cholesterol Cal: 28 mg/dL (ref 5–40)

## 2019-07-22 LAB — BASIC METABOLIC PANEL
BUN/Creatinine Ratio: 13 (ref 12–28)
BUN: 10 mg/dL (ref 8–27)
CO2: 21 mmol/L (ref 20–29)
Calcium: 10 mg/dL (ref 8.7–10.3)
Chloride: 103 mmol/L (ref 96–106)
Creatinine, Ser: 0.75 mg/dL (ref 0.57–1.00)
GFR calc Af Amer: 98 mL/min/{1.73_m2} (ref >59)
GFR calc non Af Amer: 85 mL/min/{1.73_m2} (ref >59)
Glucose: 100 mg/dL — ABNORMAL HIGH (ref 65–99)
Potassium: 4.1 mmol/L (ref 3.5–5.2)
Sodium: 142 mmol/L (ref 134–144)

## 2019-07-26 NOTE — Progress Notes (Signed)
Message left with results.  Adding a statin would only reduce her ASCVD risk by 1.5%, patient informed so that she can decide if she would like to add this or not.

## 2019-08-04 ENCOUNTER — Other Ambulatory Visit: Payer: Self-pay

## 2019-08-04 ENCOUNTER — Ambulatory Visit (INDEPENDENT_AMBULATORY_CARE_PROVIDER_SITE_OTHER): Payer: Medicare Other | Admitting: Family Medicine

## 2019-08-04 ENCOUNTER — Encounter: Payer: Self-pay | Admitting: Family Medicine

## 2019-08-04 VITALS — BP 132/90 | HR 91 | Wt 230.0 lb

## 2019-08-04 DIAGNOSIS — E785 Hyperlipidemia, unspecified: Secondary | ICD-10-CM

## 2019-08-04 DIAGNOSIS — M1710 Unilateral primary osteoarthritis, unspecified knee: Secondary | ICD-10-CM

## 2019-08-04 MED ORDER — METHYLPREDNISOLONE ACETATE 40 MG/ML IJ SUSP
40.0000 mg | Freq: Once | INTRAMUSCULAR | Status: AC
  Administered 2019-08-04: 40 mg via INTRAMUSCULAR

## 2019-08-04 MED ORDER — METHYLPREDNISOLONE ACETATE 40 MG/ML IJ SUSP
40.0000 mg | Freq: Once | INTRAMUSCULAR | Status: AC
  Administered 2019-08-04: 40 mg via INTRA_ARTICULAR

## 2019-08-04 NOTE — Assessment & Plan Note (Addendum)
Patient elected for bilateral corticosteroid injections of the knees today.  She can return in 3 months if needed for repeat injection.  Discussed that she may benefit from knee replacement in the future.

## 2019-08-04 NOTE — Patient Instructions (Signed)
It was nice seeing you today Ms. Psomas!  We agreed to check your cholesterol yearly rather than starting a cholesterol medication today.  You can return in about 3 months for another knee injection if needed.  If you have any questions or concerns, please feel free to call the clinic.   Be well,  Dr. Shan Levans

## 2019-08-04 NOTE — Progress Notes (Signed)
    SUBJECTIVE:   CHIEF COMPLAINT / HPI:   Bilateral knee pain Chronic osteoarthritis, relieved by corticosteroid injections in the past Last corticosteroid injection was in September 2020 Has an active job cleaning houses Pain has not worsened recently but is present daily  Hyperlipidemia Patient was told via phone message that her cholesterol is high but her ASCVD risk would only be reduced by 1.5% if we started a statin She would like to know what my opinion is today about starting this medication  PERTINENT  PMH / PSH: Hypertension, hyperlipidemia, obesity, bilateral osteoarthritis of the knee  OBJECTIVE:   BP 132/90   Pulse 91   Wt 230 lb (104.3 kg)   SpO2 98%   BMI 39.48 kg/m   General: well appearing, appears stated age Extremities: Left knee-no effusion or other visual abnormality, mildly tender to palpation of the medial joint line, mild tenderness with valgus and varus stress, negative posterior and anterior drawer sign, neurovascularly intact Right knee-no effusion or other visual abnormality, mildly tender to palpation of the medial joint line, no tenderness with valgus and varus stress, negative posterior and anterior drawer sign, neurovascularly intact   ASSESSMENT/PLAN:   Hyperlipidemia with target LDL less than 130 Since ASCVD risk reduction is negligible with the addition of a statin, will patient decided to not start a statin and to continue checking her cholesterol yearly.  We also reviewed lifestyle modifications that can help reduce cholesterol.  Arthritis of knee Patient elected for bilateral corticosteroid injections of the knees today.  She can return in 3 months if needed for repeat injection.  Discussed that she may benefit from knee replacement in the future.  After informed written consent timeout was performed, patient was seated on exam table. Left and right knees waere prepped with alcohol swab and utilizing anteromedial approach, patient's left  and right knees were injected intraarticularly with 4:1 bupivicaine: depomedrol. Patient tolerated the procedure well without immediate complications.    Kathrene Alu, MD [place]

## 2019-08-04 NOTE — Assessment & Plan Note (Signed)
Since ASCVD risk reduction is negligible with the addition of a statin, will patient decided to not start a statin and to continue checking her cholesterol yearly.  We also reviewed lifestyle modifications that can help reduce cholesterol.

## 2019-08-23 ENCOUNTER — Other Ambulatory Visit: Payer: Self-pay

## 2019-08-23 MED ORDER — MONTELUKAST SODIUM 10 MG PO TABS: ORAL_TABLET | ORAL | 2 refills | Status: DC

## 2019-09-01 ENCOUNTER — Telehealth: Payer: Self-pay

## 2019-09-01 NOTE — Telephone Encounter (Signed)
Lisa Manns, NP with [facility] calls nurse line to report A1C level during house visit. Reports hgb A1C of 5.9 (pre-diabetic range).   To PCP  Please advise  Lisa Grumbling, RN

## 2019-09-10 NOTE — Telephone Encounter (Signed)
Called Mr. Belmarez and Ms. Zucco and informed them of their A1c results.  I explained what this result means and the dietary changes they can make to help reduce the risk of progression to type 2 diabetes I also discussed the possibility of adding Metformin to prevent progression as well if they are interested.  She says that they will discuss and think about this and let me know if they are interested.

## 2019-10-01 ENCOUNTER — Emergency Department (HOSPITAL_COMMUNITY): Payer: Medicare Other

## 2019-10-01 ENCOUNTER — Other Ambulatory Visit: Payer: Self-pay

## 2019-10-01 ENCOUNTER — Observation Stay (HOSPITAL_COMMUNITY)
Admission: EM | Admit: 2019-10-01 | Discharge: 2019-10-02 | Disposition: A | Discharge status: Home / Self Care | Payer: Medicare Other | Attending: Family Medicine | Admitting: Family Medicine

## 2019-10-01 ENCOUNTER — Encounter (HOSPITAL_COMMUNITY): Payer: Self-pay | Admitting: Pediatrics

## 2019-10-01 DIAGNOSIS — E039 Hypothyroidism, unspecified: Secondary | ICD-10-CM | POA: Diagnosis present

## 2019-10-01 DIAGNOSIS — J45901 Unspecified asthma with (acute) exacerbation: Secondary | ICD-10-CM | POA: Diagnosis not present

## 2019-10-01 DIAGNOSIS — Z7951 Long term (current) use of inhaled steroids: Secondary | ICD-10-CM | POA: Insufficient documentation

## 2019-10-01 DIAGNOSIS — Z79899 Other long term (current) drug therapy: Secondary | ICD-10-CM | POA: Insufficient documentation

## 2019-10-01 DIAGNOSIS — I10 Essential (primary) hypertension: Secondary | ICD-10-CM | POA: Diagnosis not present

## 2019-10-01 DIAGNOSIS — E739 Lactose intolerance, unspecified: Secondary | ICD-10-CM | POA: Diagnosis present

## 2019-10-01 DIAGNOSIS — J4541 Moderate persistent asthma with (acute) exacerbation: Secondary | ICD-10-CM

## 2019-10-01 DIAGNOSIS — E785 Hyperlipidemia, unspecified: Secondary | ICD-10-CM | POA: Diagnosis present

## 2019-10-01 DIAGNOSIS — R0602 Shortness of breath: Secondary | ICD-10-CM

## 2019-10-01 DIAGNOSIS — Z6841 Body Mass Index (BMI) 40.0 and over, adult: Secondary | ICD-10-CM | POA: Diagnosis present

## 2019-10-01 DIAGNOSIS — Z7982 Long term (current) use of aspirin: Secondary | ICD-10-CM | POA: Diagnosis not present

## 2019-10-01 DIAGNOSIS — Z87891 Personal history of nicotine dependence: Secondary | ICD-10-CM | POA: Insufficient documentation

## 2019-10-01 DIAGNOSIS — Z79890 Hormone replacement therapy (postmenopausal): Secondary | ICD-10-CM | POA: Diagnosis not present

## 2019-10-01 DIAGNOSIS — Z6839 Body mass index (BMI) 39.0-39.9, adult: Secondary | ICD-10-CM | POA: Insufficient documentation

## 2019-10-01 DIAGNOSIS — E669 Obesity, unspecified: Secondary | ICD-10-CM | POA: Diagnosis not present

## 2019-10-01 DIAGNOSIS — K219 Gastro-esophageal reflux disease without esophagitis: Secondary | ICD-10-CM | POA: Diagnosis present

## 2019-10-01 DIAGNOSIS — I2699 Other pulmonary embolism without acute cor pulmonale: Secondary | ICD-10-CM | POA: Diagnosis not present

## 2019-10-01 DIAGNOSIS — Z20822 Contact with and (suspected) exposure to covid-19: Secondary | ICD-10-CM | POA: Diagnosis not present

## 2019-10-01 DIAGNOSIS — R062 Wheezing: Secondary | ICD-10-CM | POA: Diagnosis not present

## 2019-10-01 DIAGNOSIS — J45909 Unspecified asthma, uncomplicated: Secondary | ICD-10-CM | POA: Diagnosis present

## 2019-10-01 DIAGNOSIS — R05 Cough: Secondary | ICD-10-CM | POA: Diagnosis not present

## 2019-10-01 DIAGNOSIS — J309 Allergic rhinitis, unspecified: Secondary | ICD-10-CM | POA: Diagnosis present

## 2019-10-01 DIAGNOSIS — R0902 Hypoxemia: Secondary | ICD-10-CM | POA: Insufficient documentation

## 2019-10-01 DIAGNOSIS — R4702 Dysphasia: Secondary | ICD-10-CM | POA: Diagnosis not present

## 2019-10-01 DIAGNOSIS — Z743 Need for continuous supervision: Secondary | ICD-10-CM | POA: Diagnosis not present

## 2019-10-01 LAB — CBC WITH DIFFERENTIAL/PLATELET
Abs Immature Granulocytes: 0.05 10*3/uL (ref 0.00–0.07)
Basophils Absolute: 0.1 10*3/uL (ref 0.0–0.1)
Basophils Relative: 1 %
Eosinophils Absolute: 0.7 10*3/uL — ABNORMAL HIGH (ref 0.0–0.5)
Eosinophils Relative: 6 %
HCT: 47.1 % — ABNORMAL HIGH (ref 36.0–46.0)
Hemoglobin: 15.4 g/dL — ABNORMAL HIGH (ref 12.0–15.0)
Immature Granulocytes: 1 %
Lymphocytes Relative: 23 %
Lymphs Abs: 2.6 10*3/uL (ref 0.7–4.0)
MCH: 31 pg (ref 26.0–34.0)
MCHC: 32.7 g/dL (ref 30.0–36.0)
MCV: 95 fL (ref 80.0–100.0)
Monocytes Absolute: 0.5 10*3/uL (ref 0.1–1.0)
Monocytes Relative: 5 %
Neutro Abs: 7.2 10*3/uL (ref 1.7–7.7)
Neutrophils Relative %: 64 %
Platelets: 252 10*3/uL (ref 150–400)
RBC: 4.96 MIL/uL (ref 3.87–5.11)
RDW: 12.4 % (ref 11.5–15.5)
WBC: 11 10*3/uL — ABNORMAL HIGH (ref 4.0–10.5)
nRBC: 0 % (ref 0.0–0.2)

## 2019-10-01 LAB — COMPREHENSIVE METABOLIC PANEL
ALT: 25 U/L (ref 0–44)
AST: 27 U/L (ref 15–41)
Albumin: 3.9 g/dL (ref 3.5–5.0)
Alkaline Phosphatase: 61 U/L (ref 38–126)
Anion gap: 15 (ref 5–15)
BUN: 13 mg/dL (ref 8–23)
CO2: 24 mmol/L (ref 22–32)
Calcium: 9 mg/dL (ref 8.9–10.3)
Chloride: 100 mmol/L (ref 98–111)
Creatinine, Ser: 0.77 mg/dL (ref 0.44–1.00)
GFR calc Af Amer: >60 mL/min (ref >60)
GFR calc non Af Amer: >60 mL/min (ref >60)
Glucose, Bld: 152 mg/dL — ABNORMAL HIGH (ref 70–99)
Potassium: 3.6 mmol/L (ref 3.5–5.1)
Sodium: 139 mmol/L (ref 135–145)
Total Bilirubin: 1.1 mg/dL (ref 0.3–1.2)
Total Protein: 6.8 g/dL (ref 6.5–8.1)

## 2019-10-01 LAB — TROPONIN I (HIGH SENSITIVITY)
Troponin I (High Sensitivity): 15 ng/L (ref <18)
Troponin I (High Sensitivity): 41 ng/L — ABNORMAL HIGH (ref <18)
Troponin I (High Sensitivity): 17 ng/L (ref <18)
Troponin I (High Sensitivity): 33 ng/L — ABNORMAL HIGH (ref <18)

## 2019-10-01 LAB — D-DIMER, QUANTITATIVE: D-Dimer, Quant: 1.03 ug/mL-FEU — ABNORMAL HIGH (ref 0.00–0.50)

## 2019-10-01 LAB — HIV ANTIBODY (ROUTINE TESTING W REFLEX): HIV Screen 4th Generation wRfx: NONREACTIVE

## 2019-10-01 LAB — SARS CORONAVIRUS 2 BY RT PCR (HOSPITAL ORDER, PERFORMED IN ~~LOC~~ HOSPITAL LAB): SARS Coronavirus 2: NEGATIVE

## 2019-10-01 LAB — BRAIN NATRIURETIC PEPTIDE: B Natriuretic Peptide: 38.5 pg/mL (ref 0.0–100.0)

## 2019-10-01 MED ORDER — LEVOTHYROXINE SODIUM 25 MCG PO TABS
25.0000 ug | ORAL_TABLET | Freq: Every day | ORAL | Status: DC
  Administered 2019-10-02: 25 ug via ORAL
  Filled 2019-10-01: qty 1

## 2019-10-01 MED ORDER — GUAIFENESIN ER 600 MG PO TB12
600.0000 mg | ORAL_TABLET | Freq: Two times a day (BID) | ORAL | Status: DC
  Administered 2019-10-01 – 2019-10-02 (×2): 600 mg via ORAL
  Filled 2019-10-01 (×3): qty 1

## 2019-10-01 MED ORDER — METOPROLOL SUCCINATE ER 100 MG PO TB24
100.0000 mg | ORAL_TABLET | Freq: Every day | ORAL | Status: DC
  Administered 2019-10-02: 100 mg via ORAL
  Filled 2019-10-01: qty 1

## 2019-10-01 MED ORDER — ALBUTEROL SULFATE HFA 108 (90 BASE) MCG/ACT IN AERS
2.0000 | INHALATION_SPRAY | Freq: Once | RESPIRATORY_TRACT | Status: AC
  Administered 2019-10-01: 2 via RESPIRATORY_TRACT
  Filled 2019-10-01: qty 6.7

## 2019-10-01 MED ORDER — ASPIRIN EC 81 MG PO TBEC
81.0000 mg | DELAYED_RELEASE_TABLET | Freq: Every day | ORAL | Status: DC
  Administered 2019-10-02: 81 mg via ORAL
  Filled 2019-10-01: qty 1

## 2019-10-01 MED ORDER — LORATADINE 10 MG PO TABS
10.0000 mg | ORAL_TABLET | Freq: Every day | ORAL | Status: DC
  Administered 2019-10-01 – 2019-10-02 (×2): 10 mg via ORAL
  Filled 2019-10-01 (×2): qty 1

## 2019-10-01 MED ORDER — AZITHROMYCIN 250 MG PO TABS: 500.0000 mg | ORAL_TABLET | Freq: Once | ORAL | Status: DC

## 2019-10-01 MED ORDER — FLUTICASONE PROPIONATE HFA 110 MCG/ACT IN AERO: 2.0000 | INHALATION_SPRAY | Freq: Two times a day (BID) | RESPIRATORY_TRACT | Status: DC

## 2019-10-01 MED ORDER — IPRATROPIUM BROMIDE HFA 17 MCG/ACT IN AERS
2.0000 | INHALATION_SPRAY | Freq: Once | RESPIRATORY_TRACT | Status: AC
  Administered 2019-10-01: 2 via RESPIRATORY_TRACT
  Filled 2019-10-01: qty 12.9

## 2019-10-01 MED ORDER — ACETAMINOPHEN 325 MG PO TABS: 650.0000 mg | ORAL_TABLET | Freq: Four times a day (QID) | ORAL | Status: DC | PRN

## 2019-10-01 MED ORDER — PREDNISONE 20 MG PO TABS
40.0000 mg | ORAL_TABLET | Freq: Every day | ORAL | Status: DC
  Administered 2019-10-02: 40 mg via ORAL
  Filled 2019-10-01: qty 2

## 2019-10-01 MED ORDER — ALBUTEROL SULFATE HFA 108 (90 BASE) MCG/ACT IN AERS
4.0000 | INHALATION_SPRAY | Freq: Once | RESPIRATORY_TRACT | Status: AC
  Administered 2019-10-01: 4 via RESPIRATORY_TRACT

## 2019-10-01 MED ORDER — IPRATROPIUM BROMIDE 0.03 % NA SOLN: 2.0000 | Freq: Two times a day (BID) | NASAL | Status: DC

## 2019-10-01 MED ORDER — LOSARTAN POTASSIUM 50 MG PO TABS
50.0000 mg | ORAL_TABLET | Freq: Every day | ORAL | Status: DC
  Administered 2019-10-02: 50 mg via ORAL
  Filled 2019-10-01: qty 1

## 2019-10-01 MED ORDER — MONTELUKAST SODIUM 10 MG PO TABS
10.0000 mg | ORAL_TABLET | Freq: Every day | ORAL | Status: DC
  Administered 2019-10-02: 10 mg via ORAL
  Filled 2019-10-01: qty 1

## 2019-10-01 MED ORDER — ACETAMINOPHEN 650 MG RE SUPP: 650.0000 mg | Freq: Four times a day (QID) | RECTAL | Status: DC | PRN

## 2019-10-01 MED ORDER — IOHEXOL 350 MG/ML SOLN
100.0000 mL | Freq: Once | INTRAVENOUS | Status: AC | PRN
  Administered 2019-10-01: 100 mL via INTRAVENOUS

## 2019-10-01 MED ORDER — BUDESONIDE 0.25 MG/2ML IN SUSP
0.2500 mg | Freq: Two times a day (BID) | RESPIRATORY_TRACT | Status: DC
  Administered 2019-10-01 – 2019-10-02 (×2): 0.25 mg via RESPIRATORY_TRACT
  Filled 2019-10-01 (×3): qty 2

## 2019-10-01 MED ORDER — PANTOPRAZOLE SODIUM 40 MG PO TBEC
40.0000 mg | DELAYED_RELEASE_TABLET | Freq: Every day | ORAL | Status: DC
  Administered 2019-10-02: 40 mg via ORAL
  Filled 2019-10-01: qty 1

## 2019-10-01 MED ORDER — BENZONATATE 100 MG PO CAPS
100.0000 mg | ORAL_CAPSULE | Freq: Once | ORAL | Status: AC
  Administered 2019-10-01: 100 mg via ORAL
  Filled 2019-10-01: qty 1

## 2019-10-01 MED ORDER — IPRATROPIUM-ALBUTEROL 0.5-2.5 (3) MG/3ML IN SOLN
3.0000 mL | RESPIRATORY_TRACT | Status: DC
  Administered 2019-10-01 – 2019-10-02 (×2): 3 mL via RESPIRATORY_TRACT
  Filled 2019-10-01 (×3): qty 3

## 2019-10-01 MED ORDER — MAGNESIUM SULFATE 2 GM/50ML IV SOLN
2.0000 g | Freq: Once | INTRAVENOUS | Status: AC
  Administered 2019-10-01: 2 g via INTRAVENOUS
  Filled 2019-10-01: qty 50

## 2019-10-01 MED ORDER — ENOXAPARIN SODIUM 40 MG/0.4ML ~~LOC~~ SOLN
40.0000 mg | SUBCUTANEOUS | Status: DC
  Administered 2019-10-01: 40 mg via SUBCUTANEOUS
  Filled 2019-10-01: qty 0.4

## 2019-10-01 NOTE — H&P (Addendum)
[facility] Admission History and Physical Service Pager: [phone]  Patient name: Lisa [name] Medical record number: [id number] Date of birth: 20-Feb-1956 Age: 64 y.o. Gender: female  Primary Care Provider: Kathrene Alu, MD Consultants: none Code Status: Full Preferred Emergency Contact: Husband   Chief Complaint: Shortness of breath  Assessment and Plan: [name] is a 64 y.o. female presenting with asthma exacerbation. PMH is significant for likely hypothyroidism, asthma, HTN and seasonal allergies.  Hypoxia secondary to asthma exacerbation Lisa [name] is a 64 year old female presents with 3-day history of cough, wheezing and shortness of breath. On exam: mildly increased work of breathing, bilateral expiratory wheeze throughout lung fields, no crackles. Requiring 1L oxygen at the time I saw her in ED (initially on 4L). Labs: WBC 11, BMP  wnl except elevated glucose to 152, BNP 38.5, D-dimer 1.03.  Trop 2, 41. CXR: No acute infiltrates CTA: negative for PE. EKG no ischemic changes. Patient desatted to 84% on ambulation and was persistently wheezy despite treatment.  Most likely differential is mild to moderate asthma exacerbation given patient has asthma history.  S/p Solu-Medrol with EMS, Azithromycin 500 mg, albuterol x2, ipratropium x1, magnesium sulfate 2 g, tessalon 100 mg. Considered pneumonia however patient afebrile and WBC only 11. Will not continue Azithromycin due low suspicion for CAP and no CXR findings. Also considered COVID-19 however Covid PCR negative. Patient denies chest pain on admission, no past medical history of cardiac disease so low suspicion of ACS. Second Troponin elevated so we will trend until flat. -Admit to FPTS, attending Dr. Nori Riis -Vitals per floor routine -Continuous pulse ox and telemetry -Up with assistance -DuoNebs every 4 hourly - flovent BID -Aim to wean off oxygen -Prednisone 40mg  starting from tomorrow for  4 doses -Lovenox for DVT prophylaxis -Mucinex 600 mg twice daily -PT/OT -Am CBC and CMP -Likely discharge tomorrow if symptoms and hypoxia improved  Elevated troponins Patient denies chest pain on admission.  EKG without ischemic changes.  Troponin 15>41 -Trend troponins   Asthma Home meds: Albuterol 2 puffs every 6 as needed, Flovent 110 mcg 2 puff twice daily, montelukast 10 mg daily - continue flovent in am -Continue montelukast daily  HTN SBP 134-156, DBP 76-86 Home meds: Losartan 50 mg daily, metoprolol 100 mg once daily -Continue losartan and metoprolol -Continue aspirin 81 mg for secondary prevention  Hypothyroidism Last TSH: 3.4 on 12/08/2018 Home meds: Synthroid 25 mcg -Continue Synthroid  Seasonal allergies Home meds: Cetirizine 10 mg  -Loratadine per hospital formulary  GERD Home meds: Nexium 40 mg -Protonix per hospital formulary  FEN/GI: Protonix, regular diet Prophylaxis: Lovenox  Disposition: MedSurg  History of Present Illness:  Lisa [name] is a 64 y.o. female presenting with shortness of breath, cough and wheeze.  The patient states she was on flovent, which was orange, and when she called in a new Rx she was given a red one.  She felt she was having some wheezing that night (Monday). She stopped using it for a few days after that.  She developed coughing after this and started using the inhaler again yesterday. She took mucinex and delsym cough medicine last night and states this helped a little. Early this morning she started coughing again on the inhaler. She used her rescue inahler last night and this morning with minimal benefit. She could feel her chest tightening this morning on the way to work.  She had to sit down to help catch her breath and called 911.  She denies fever.  Cough was minimally productive.  Patient states she hasn't been hospitalized since the birth of her son 84 years ago.  No dizziness, tachcyardia/palpitations, acute vision  changes, HA, abd pain, n/v/d/constipation, edema. Denies hx of heart disease/stroke.  Usually takes 2 puffs in the morning for flovent. No nasal spray. Took all medications except Cetirizine this weekend.   Review Of Systems: Per HPI with the following additions:   Review of Systems     Patient Active Problem List   Diagnosis Date Noted  . Asthma exacerbation 10/01/2019  . Congestion of upper respiratory tract 07/21/2019  . Need for immunization against influenza 01/22/2019  . Arthritis of knee 01/22/2019  . Colon cancer screening 01/22/2019  . Chronic pain of right knee 10/16/2018  . Candidal intertrigo 01/29/2018  . Memory change 01/24/2017  . Tinnitus 08/01/2015  . Bunion of great toe 02/07/2015  . Macular degeneration 03/02/2013  . Chondromalacia of both patellae 10/14/2012  . Hyperlipidemia with target LDL less than 130 03/06/2012  . Asthma, chronic 08/28/2009  . Allergic rhinitis 01/28/2008  . LACTOSE INTOLERANCE 09/02/2006  . Hypothyroidism 07/17/2006  . OBESITY, NOS 07/17/2006  . Alcohol abuse 07/17/2006  . HYPERTENSION, BENIGN SYSTEMIC 07/17/2006  . GASTROESOPHAGEAL REFLUX, NO ESOPHAGITIS 07/17/2006  . MENOPAUSAL SYNDROME 07/17/2006    Past Medical History: Past Medical History:  Diagnosis Date  . Alcoholism ([place])    Chronic -- not interested in cutting back.  Has cut back from 12 beers daily in 2014.    Marland Kitchen Allergy   . Asthma   . GERD (gastroesophageal reflux disease)   . Hypertension   . Hypothyroidism     Past Surgical History: Past Surgical History:  Procedure Laterality Date  . COLONOSCOPY  2010   [facility] - Repeat in 10 years as normal findings     Social History: Social History   Tobacco Use  . Smoking status: Former Research scientist (life sciences)  . Smokeless tobacco: Former Systems developer    Quit date: 03/07/1987  Substance Use Topics  . Alcohol use: Yes  . Drug use: Not on file   Additional social history:  Please also refer to relevant sections of EMR.  Family  History: No family history on file. (If not completed, MUST add something in)  Allergies and Medications: Allergies  Allergen Reactions  . Ace Inhibitors     REACTION: cough  . Penicillins     REACTION: Breaks out in hives   No current facility-administered medications on file prior to encounter.   Current Outpatient Medications on File Prior to Encounter  Medication Sig Dispense Refill  . aspirin (ADULT ASPIRIN EC LOW STRENGTH) 81 MG EC tablet Take 81 mg by mouth daily.      . cetirizine (ZYRTEC) 10 MG tablet TAKE 1 TABLET BY MOUTH ONCE DAILY (Patient taking differently: Take 10 mg by mouth daily. ) 90 tablet 1  . esomeprazole (NEXIUM) 40 MG capsule Take 1 capsule PO every day at noon (Patient taking differently: Take 40 mg by mouth daily. ) 210 capsule 0  . fluticasone (FLOVENT HFA) 110 MCG/ACT inhaler INHALE 2 PUFFS BY MOUTH EVERY 12 HOURS (Patient taking differently: Inhale 2 puffs into the lungs every 12 (twelve) hours. ) 12 g 2  . ipratropium (ATROVENT) 0.03 % nasal spray Place 2 sprays into both nostrils every 12 (twelve) hours. 30 mL 0  . levothyroxine (SYNTHROID) 25 MCG tablet Take 1 tablet (25 mcg total) by mouth daily. 90 tablet 3  . losartan (COZAAR) 50  MG tablet Take 1 tablet (50 mg total) by mouth daily. 90 tablet 3  . metoprolol succinate (TOPROL-XL) 100 MG 24 hr tablet Take 1 tablet (100 mg total) by mouth daily. Take with or immediately following a meal. 90 tablet 2  . montelukast (SINGULAIR) 10 MG tablet TAKE 1 TABLET(10 MG) BY MOUTH DAILY (Patient taking differently: Take 10 mg by mouth daily. ) 90 tablet 2  . albuterol (PROVENTIL HFA;VENTOLIN HFA) 108 (90 Base) MCG/ACT inhaler INHALE 2 PUFFS BY MOUTH EVERY 6 HOURS IF NEEDED FOR WHEEZING (Patient not taking: No sig reported) 54 g 0  . clotrimazole-betamethasone (LOTRISONE) cream Apply 1 application topically 2 (two) times daily. (Patient not taking: Reported on 10/01/2019) 30 g 0  . fluconazole (DIFLUCAN) 150 MG tablet  Take 1 tablet (150 mg total) by mouth once a week. (Patient not taking: Reported on 10/01/2019) 4 tablet 0  . fluticasone (FLOVENT HFA) 44 MCG/ACT inhaler Inhale 1 puff into the lungs 2 (two) times daily. 1 Inhaler 12  . nystatin (MYCOSTATIN/NYSTOP) powder Apply topically 4 (four) times daily. (Patient not taking: Reported on 10/01/2019) 60 g 3    Objective: BP (!) 143/92   Pulse 89   Temp 97.9 F (36.6 C) (Oral)   Resp 15   Ht 5\' 4"  (1.626 m)   Wt 104.3 kg   SpO2 98%   BMI 39.47 kg/m   Exam: General: 64 year old female seen up in bed, no acute distress, [place] 1L oxygen  Eyes: No scleral icterus, normal extraocular eye movements ENTM: No thyromegaly or lymphadenopathy Neck: Supple, normal range of movement Cardiovascular: s1 and S2, mildly tachycardic, normal cap refill Respiratory: Bilateral wheeze throughout lung fields, no crackles, mildly increased work of breathing Gastrointestinal: Abdomen soft nontender, bowel sounds present MSK: No obvious deformities Derm: Warm to touch and dry Neuro: No nerves grossly intact Psych: Normal mood, normal affect  Labs and Imaging: CBC BMET  Recent Labs  Lab 10/01/19 0927  WBC 11.0*  HGB 15.4*  HCT 47.1*  PLT 252   Recent Labs  Lab 10/01/19 0927  NA 139  K 3.6  CL 100  CO2 24  BUN 13  CREATININE 0.77  GLUCOSE 152*  CALCIUM 9.0     EKG: Sinus rhythm  Lattie Haw, MD 10/01/2019, 7:17 PM PGY-1, [place] Intern pager: [phone], text pages welcome  Resident Addendum I have separately seen and examined the patient.  I have discussed the findings and exam with the resident and agree with the above note.  I helped develop the management plan that is described in the resident's note and I agree with the content.  Changes have been made in BLUE.    Addison Naegeli, MD PGY-2 [facility] residency program

## 2019-10-01 NOTE — ED Provider Notes (Signed)
[place] EMERGENCY DEPARTMENT Provider Note   CSN: [id number] Arrival date & time: 10/01/19  0845     History Chief Complaint  Patient presents with  . Shortness of Breath    NAKYIAH PERINA is a 64 y.o. female.  The history is provided by the patient, the EMS personnel, the spouse and medical records. No language interpreter was used.  Wheezing Severity:  Severe Severity compared to prior episodes:  Similar Onset quality:  Gradual Duration:  3 days Timing:  Constant Progression:  Improving Chronicity:  New Context: pollens   Relieved by:  Nothing Worsened by:  Nothing Ineffective treatments:  Home nebulizer Associated symptoms: cough and shortness of breath   Associated symptoms: no chest pain, no chest tightness, no fatigue, no fever, no foot swelling, no headaches, no rhinorrhea, no sputum production and no stridor        Past Medical History:  Diagnosis Date  . Alcoholism ([place])    Chronic -- not interested in cutting back.  Has cut back from 12 beers daily in 2014.    Marland Kitchen Allergy   . Asthma   . GERD (gastroesophageal reflux disease)   . Hypertension   . Hypothyroidism     Patient Active Problem List   Diagnosis Date Noted  . Congestion of upper respiratory tract 07/21/2019  . Need for immunization against influenza 01/22/2019  . Arthritis of knee 01/22/2019  . Colon cancer screening 01/22/2019  . Chronic pain of right knee 10/16/2018  . Candidal intertrigo 01/29/2018  . Memory change 01/24/2017  . Tinnitus 08/01/2015  . Bunion of great toe 02/07/2015  . Macular degeneration 03/02/2013  . Chondromalacia of both patellae 10/14/2012  . Hyperlipidemia with target LDL less than 130 03/06/2012  . Asthma, chronic 08/28/2009  . Allergic rhinitis 01/28/2008  . LACTOSE INTOLERANCE 09/02/2006  . Hypothyroidism 07/17/2006  . OBESITY, NOS 07/17/2006  . Alcohol abuse 07/17/2006  . HYPERTENSION, BENIGN SYSTEMIC 07/17/2006  . GASTROESOPHAGEAL  REFLUX, NO ESOPHAGITIS 07/17/2006  . MENOPAUSAL SYNDROME 07/17/2006    Past Surgical History:  Procedure Laterality Date  . COLONOSCOPY  2010   [facility] - Repeat in 10 years as normal findings      OB History   No obstetric history on file.     No family history on file.  Social History   Tobacco Use  . Smoking status: Former Research scientist (life sciences)  . Smokeless tobacco: Former Systems developer    Quit date: 03/07/1987  Substance Use Topics  . Alcohol use: Yes  . Drug use: Not on file    Home Medications Prior to Admission medications   Medication Sig Start Date End Date Taking? Authorizing Provider  albuterol (PROVENTIL HFA;VENTOLIN HFA) 108 (90 Base) MCG/ACT inhaler INHALE 2 PUFFS BY MOUTH EVERY 6 HOURS IF NEEDED FOR WHEEZING 02/28/18   Alveda Reasons, MD  aspirin (ADULT ASPIRIN EC LOW STRENGTH) 81 MG EC tablet Take 81 mg by mouth daily.      [provider]  cetirizine (ZYRTEC) 10 MG tablet TAKE 1 TABLET BY MOUTH ONCE DAILY 11/24/17   Alveda Reasons, MD  clotrimazole-betamethasone (LOTRISONE) cream Apply 1 application topically 2 (two) times daily. 01/27/18   Alveda Reasons, MD  esomeprazole (NEXIUM) 40 MG capsule Take 1 capsule PO every day at noon 04/23/19   Kathrene Alu, MD  fluconazole (DIFLUCAN) 150 MG tablet Take 1 tablet (150 mg total) by mouth once a week. 01/14/18   Martyn Malay, MD  fluticasone (FLOVENT  HFA) 110 MCG/ACT inhaler INHALE 2 PUFFS BY MOUTH EVERY 12 HOURS 09/16/18   Alveda Reasons, MD  fluticasone (FLOVENT HFA) 44 MCG/ACT inhaler Inhale 1 puff into the lungs 2 (two) times daily. 01/27/18   Alveda Reasons, MD  ipratropium (ATROVENT) 0.03 % nasal spray Place 2 sprays into both nostrils every 12 (twelve) hours. 07/21/19   Kathrene Alu, MD  levothyroxine (SYNTHROID) 25 MCG tablet Take 1 tablet (25 mcg total) by mouth daily. 02/22/19   Kathrene Alu, MD  losartan (COZAAR) 50 MG tablet Take 1 tablet (50 mg total) by mouth daily. 03/11/19   Kathrene Alu, MD  metoprolol succinate (TOPROL-XL) 100 MG 24 hr tablet Take 1 tablet (100 mg total) by mouth daily. Take with or immediately following a meal. 05/18/19   Winfrey, Alcario Drought, MD  montelukast (SINGULAIR) 10 MG tablet TAKE 1 TABLET(10 MG) BY MOUTH DAILY 08/23/19   Kathrene Alu, MD  nystatin (MYCOSTATIN/NYSTOP) powder Apply topically 4 (four) times daily. 01/12/18   Martyn Malay, MD    Allergies    Ace inhibitors and Penicillins  Review of Systems   Review of Systems  Constitutional: Negative for chills, diaphoresis, fatigue and fever.  HENT: Negative for congestion and rhinorrhea.   Eyes: Negative for visual disturbance.  Respiratory: Positive for cough, shortness of breath and wheezing. Negative for sputum production, chest tightness and stridor.   Cardiovascular: Negative for chest pain.  Gastrointestinal: Negative for abdominal pain, constipation, diarrhea, nausea and vomiting.  Genitourinary: Negative for dysuria, flank pain and frequency.  Musculoskeletal: Negative for back pain, neck pain and neck stiffness.  Neurological: Negative for dizziness, light-headedness and headaches.  Psychiatric/Behavioral: Negative for agitation.  All other systems reviewed and are negative.   Physical Exam Updated Vital Signs BP (!) 156/81 (BP Location: Right Arm)   Pulse 97   Temp 97.9 F (36.6 C) (Oral)   Resp (!) 24   Ht 5\' 4"  (1.626 m)   Wt 104.3 kg   SpO2 94%   BMI 39.47 kg/m   Physical Exam Vitals and nursing note reviewed.  Constitutional:      General: She is not in acute distress.    Appearance: She is well-developed. She is not ill-appearing, toxic-appearing or diaphoretic.     Interventions: She is not intubated. HENT:     Head: Normocephalic and atraumatic.  Eyes:     Conjunctiva/sclera: Conjunctivae normal.     Pupils: Pupils are equal, round, and reactive to light.  Cardiovascular:     Rate and Rhythm: Normal rate and regular rhythm.     Heart sounds: No  murmur.  Pulmonary:     Effort: Tachypnea present. No respiratory distress. She is not intubated.     Breath sounds: Wheezing present. No decreased breath sounds, rhonchi or rales.  Chest:     Chest wall: No tenderness.  Abdominal:     Palpations: Abdomen is soft.     Tenderness: There is no abdominal tenderness.  Musculoskeletal:     Cervical back: Neck supple.     Right lower leg: No tenderness. No edema.     Left lower leg: No tenderness. No edema.  Skin:    General: Skin is warm and dry.     Capillary Refill: Capillary refill takes less than 2 seconds.  Neurological:     General: No focal deficit present.     Mental Status: She is alert.  Psychiatric:        Mood  and Affect: Mood normal.     ED Results / Procedures / Treatments   Labs (all labs ordered are listed, but only abnormal results are displayed) Labs Reviewed  CBC WITH DIFFERENTIAL/PLATELET - Abnormal; Notable for the following components:      Result Value   WBC 11.0 (*)    Hemoglobin 15.4 (*)    HCT 47.1 (*)    Eosinophils Absolute 0.7 (*)    All other components within normal limits  COMPREHENSIVE METABOLIC PANEL - Abnormal; Notable for the following components:   Glucose, Bld 152 (*)    All other components within normal limits  D-DIMER, QUANTITATIVE (NOT AT [facility]) - Abnormal; Notable for the following components:   D-Dimer, Quant 1.03 (*)    All other components within normal limits  TROPONIN I (HIGH SENSITIVITY) - Abnormal; Notable for the following components:   Troponin I (High Sensitivity) 41 (*)    All other components within normal limits  SARS CORONAVIRUS 2 BY RT PCR (HOSPITAL ORDER, [facility])  BRAIN NATRIURETIC PEPTIDE  TROPONIN I (HIGH SENSITIVITY)    EKG EKG Interpretation  Date/Time:  Friday Oct 01 2019 08:53:34 EDT Ventricular Rate:  94 PR Interval:    QRS Duration: 91 QT Interval:  357 QTC Calculation: 447 R Axis:   71 Text Interpretation: Sinus  rhythm When compared to prior, more wandering baseline and faster rate. No STEMI Confirmed by Antony Blackbird [phone]) on 10/01/2019 9:03:17 AM   Radiology DG Chest Portable 1 View  Result Date: 10/01/2019 CLINICAL DATA:  Shortness of breath with cough and wheezing EXAM: PORTABLE CHEST 1 VIEW COMPARISON:  September 15, 2007 FINDINGS: Lungs are clear. Heart size and pulmonary vascularity are normal. No adenopathy. No bone lesions. IMPRESSION: Lungs clear.  Cardiac silhouette within normal limits. Electronically Signed   By: Lowella Grip III M.D.   On: 10/01/2019 09:31    Procedures Procedures (including critical care time)  Medications Ordered in ED Medications  magnesium sulfate IVPB 2 g 50 mL (0 g Intravenous Stopped 10/01/19 1028)  albuterol (VENTOLIN HFA) 108 (90 Base) MCG/ACT inhaler 2 puff (2 puffs Inhalation Given 10/01/19 0925)  ipratropium (ATROVENT HFA) inhaler 2 puff (2 puffs Inhalation Given 10/01/19 [id number])    ED Course  I have reviewed the triage vital signs and the nursing notes.  Pertinent labs & imaging results that were available during my care of the patient were reviewed by me and considered in my medical decision making (see chart for details).    MDM Rules/Calculators/A&P                      SHEEVA BOEHL is a 64 y.o. female with a past medical history significant for asthma, GERD, hyperlipidemia, hypothyroidism, who presents with shortness of breath wheezing, and cough.  Patient reports that for the last 3 days, she has had some cough and difficulty breathing with wheezing.  She reports that she has taken home inhalers but has not worked.  She thinks that is related to the environmental allergies and pollens that have been worsening.  She was found to be tachypneic after calling EMS this morning.  Patient was given albuterol, Atrovent, and Solu-Medrol.  She was not given magnesium yet.  She was maintaining her oxygen saturations in the low 90s on arrival but was  tachypneic and very short of breath.  Patient reports that she has had no chest pain with it and has had no abdominal pain.  She denies any trauma.  She reports no recent medication changes.  She denies any fevers, chills, or congestion.  It is a dry cough.  She denies any new leg pain or leg swelling.  No other complaints.  On exam, lungs are wheezing in all lung fields with there were no rhonchi or rales.  Chest and abdomen were nontender.  Good pulses in extremities.  Legs are nonedematous nontender.  Patient is breathing better on oxygen and oxygen saturations are in the mid 90s.  EKG shows no STEMI.  Clinically I suspect asthma exacerbation in the setting of the intense allergens and pollens.  Patient will be given magnesium as well as some more puffs of albuterol and Atrovent.  She will be monitored.  We discussed getting labs and chest x-ray to rule out pneumonia or other abnormality initially.  She is breathing better and maintains oxygen saturations on room air with ambulation, anticipate she will be stable for discharge home for asthma exacerbation if she is doing better.  If not, will discuss further disposition management.  1:23 PM Patient is work-up is begun to return and unfortunately, her troponin went from 15-41.  Patient also still having shortness of breath and now has some hypoxia with oxygen saturations in the 80s.  She is now on 4 L nasal cannula.  Due to the rising troponin and this new oxygen requirement, she will be called for admission however we need to rule out pulmonary embolism or heart failure at this time.  Her x-ray did not show a pneumonia or significant pulmonary edema or pneumothorax.  Her CBC and CMP were overall reassuring.  Anticipate admission after work-up is completed.  We will also get a Covid test started.  D-dimer was elevated, will get CT PE study.  Patient is awaiting PE study before calling for admission for likely asthma exacerbation causing the hypoxia  and wheezing.  Patient is in agreement with admission after work-up is completed.  Care transferred to Dr. Ralene Bathe while awaiting CT results.   Final Clinical Impression(s) / ED Diagnoses Final diagnoses:  Hypoxia  Exacerbation of asthma, unspecified asthma severity, unspecified whether persistent  Shortness of breath    Clinical Impression: 1. Hypoxia   2. Exacerbation of asthma, unspecified asthma severity, unspecified whether persistent   3. Shortness of breath     Disposition: Admit  This note was prepared with assistance of Dragon voice recognition software. Occasional wrong-word or sound-a-like substitutions may have occurred due to the inherent limitations of voice recognition software.      Maryse Brierley, Gwenyth Allegra, MD 10/01/19 1534

## 2019-10-01 NOTE — ED Triage Notes (Signed)
Patient arrived via EMS; c/o wheezing, difficulty breathing. Given Albuterol 10 mg Atrovent 2mg  neb  Along  W/ solumedrol 125 mg IV.

## 2019-10-01 NOTE — ED Notes (Signed)
Attempted to remove supplemental 02 at 2 L; patient unable to tolerate. Sp02 dropped to 92% from 98% patient stated having difficulty w/ exhalation. Patient is placed back on supplemental o2 via [place] @ 3LPM.

## 2019-10-01 NOTE — ED Provider Notes (Signed)
Patient care assumed at 1500.  Pt with hx/o asthma here with increased sob.  CTA pending.    CTA neg for PE.  Pt does have increased oxygen requirement.  Treated with additional albuterol, tessalon, mucinex, azithromycin.  Plan to admit for further treatment.     Quintella Reichert, MD 10/01/19 2117

## 2019-10-02 DIAGNOSIS — J4541 Moderate persistent asthma with (acute) exacerbation: Secondary | ICD-10-CM

## 2019-10-02 DIAGNOSIS — I10 Essential (primary) hypertension: Secondary | ICD-10-CM | POA: Diagnosis not present

## 2019-10-02 DIAGNOSIS — J449 Chronic obstructive pulmonary disease, unspecified: Secondary | ICD-10-CM | POA: Diagnosis not present

## 2019-10-02 DIAGNOSIS — J45901 Unspecified asthma with (acute) exacerbation: Secondary | ICD-10-CM | POA: Diagnosis not present

## 2019-10-02 LAB — CBC
HCT: 44.1 % (ref 36.0–46.0)
Hemoglobin: 14.9 g/dL (ref 12.0–15.0)
MCH: 31.5 pg (ref 26.0–34.0)
MCHC: 33.8 g/dL (ref 30.0–36.0)
MCV: 93.2 fL (ref 80.0–100.0)
Platelets: 323 10*3/uL (ref 150–400)
RBC: 4.73 MIL/uL (ref 3.87–5.11)
RDW: 12.6 % (ref 11.5–15.5)
WBC: 14.7 10*3/uL — ABNORMAL HIGH (ref 4.0–10.5)
nRBC: 0 % (ref 0.0–0.2)

## 2019-10-02 LAB — BASIC METABOLIC PANEL
Anion gap: 9 (ref 5–15)
BUN: 16 mg/dL (ref 8–23)
CO2: 24 mmol/L (ref 22–32)
Calcium: 9.5 mg/dL (ref 8.9–10.3)
Chloride: 106 mmol/L (ref 98–111)
Creatinine, Ser: 0.77 mg/dL (ref 0.44–1.00)
GFR calc Af Amer: >60 mL/min (ref >60)
GFR calc non Af Amer: >60 mL/min (ref >60)
Glucose, Bld: 121 mg/dL — ABNORMAL HIGH (ref 70–99)
Potassium: 4.3 mmol/L (ref 3.5–5.1)
Sodium: 139 mmol/L (ref 135–145)

## 2019-10-02 MED ORDER — GUAIFENESIN ER 600 MG PO TB12: 600.0000 mg | ORAL_TABLET | Freq: Two times a day (BID) | ORAL | Status: DC | PRN

## 2019-10-02 MED ORDER — IPRATROPIUM-ALBUTEROL 0.5-2.5 (3) MG/3ML IN SOLN
3.0000 mL | Freq: Two times a day (BID) | RESPIRATORY_TRACT | Status: DC
  Administered 2019-10-02: 3 mL via RESPIRATORY_TRACT
  Filled 2019-10-02: qty 3

## 2019-10-02 MED ORDER — ALBUTEROL SULFATE (2.5 MG/3ML) 0.083% IN NEBU
2.5000 mg | INHALATION_SOLUTION | Freq: Four times a day (QID) | RESPIRATORY_TRACT | 12 refills | Status: DC | PRN
Start: 2019-10-02 — End: 2020-11-27

## 2019-10-02 MED ORDER — FLOVENT HFA 44 MCG/ACT IN AERO
1.0000 | INHALATION_SPRAY | Freq: Two times a day (BID) | RESPIRATORY_TRACT | 0 refills | Status: DC
Start: 2019-10-02 — End: 2019-10-20

## 2019-10-02 MED ORDER — ALBUTEROL SULFATE (2.5 MG/3ML) 0.083% IN NEBU: 2.5000 mg | INHALATION_SOLUTION | RESPIRATORY_TRACT | Status: DC | PRN

## 2019-10-02 MED ORDER — PREDNISONE 20 MG PO TABS: 40.0000 mg | ORAL_TABLET | Freq: Every day | ORAL | 0 refills | Status: AC

## 2019-10-02 NOTE — Evaluation (Signed)
Physical Therapy Evaluation and Discharge Patient Details Name: Lisa [name] MRN: [id number] DOB: 1955-07-29 Today's Date: 10/02/2019   History of Present Illness  Lisa [name] is a 64 y.o. female presenting with asthma exacerbation. PMH is significant for likely hypothyroidism, asthma, HTN and seasonal allergies.  Clinical Impression   Patient evaluated by Physical Therapy with no further acute PT needs identified. All education has been completed and the patient has no further questions. Walked the hallways on Room air and O2 sats remained greater than or equal to 94%; She feels much better and hopes to get home soon; See below for any follow-up Physical Therapy or equipment needs. PT is signing off. Thank you for this referral.     Follow Up Recommendations No PT follow up    Equipment Recommendations  None recommended by PT    Recommendations for Other Services       Precautions / Restrictions Precautions Precautions: None      Mobility  Bed Mobility Overal bed mobility: Independent                Transfers Overall transfer level: Independent                  Ambulation/Gait Ambulation/Gait assistance: Independent Gait Distance (Feet): 600 Feet Assistive device: None Gait Pattern/deviations: Step-through pattern Gait velocity: approaching WNL   General Gait Details: Walked on [employer] with no difficulty, and O2 sats remained greater than or equal to 94%  Stairs            Wheelchair Mobility    Modified Rankin (Stroke Patients Only)       Balance Overall balance assessment: No apparent balance deficits (not formally assessed)                                           Pertinent Vitals/Pain Pain Assessment: No/denies pain    Home Living Family/patient expects to be discharged to:: Private residence Living Arrangements: Spouse/significant other Available Help at Discharge: Family;Available 24 hours/day Type of  Home: Mobile home Home Access: Stairs to enter Entrance Stairs-Rails: Right Entrance Stairs-Number of Steps: 5 Home Layout: One level   Additional Comments: Does not drive due to macular degeneration; husband drives her    Prior Function Level of Independence: Independent         Comments: Works in housekeeping at [employer]        Extremity/Trunk Assessment   Upper Extremity Assessment Upper Extremity Assessment: Overall WFL for tasks assessed    Lower Extremity Assessment Lower Extremity Assessment: Overall WFL for tasks assessed       Communication   Communication: No difficulties  Cognition Arousal/Alertness: Awake/alert Behavior During Therapy: WFL for tasks assessed/performed Overall Cognitive Status: Within Functional Limits for tasks assessed                                        General Comments      Exercises     Assessment/Plan    PT Assessment Patent does not need any further PT services  PT Problem List         PT Treatment Interventions      PT Goals (Current goals can be found in the Care Plan section)  Acute  Rehab PT Goals Patient Stated Goal: Hopes to go home today PT Goal Formulation: All assessment and education complete, DC therapy    Frequency     Barriers to discharge        Co-evaluation               AM-PAC PT "6 Clicks" Mobility  Outcome Measure Help needed turning from your back to your side while in a flat bed without using bedrails?: None Help needed moving from lying on your back to sitting on the side of a flat bed without using bedrails?: None Help needed moving to and from a bed to a chair (including a wheelchair)?: None Help needed standing up from a chair using your arms (e.g., wheelchair or bedside chair)?: None Help needed to walk in hospital room?: None Help needed climbing 3-5 steps with a railing? : None 6 Click Score: 24    End of Session   Activity  Tolerance: Patient tolerated treatment well Patient left: Other (comment)(Managing independently in room) Nurse Communication: Mobility status;Other (comment)(Did very well on room air) PT Visit Diagnosis: Other abnormalities of gait and mobility (R26.89)    Time: [id number] PT Time Calculation (min) (ACUTE ONLY): 17 min   Charges:   PT Evaluation $PT Eval Low Complexity: 1 Low          Roney Marion, PT  Acute Rehabilitation Services Pager [phone] Office [place] 10/02/2019, 11:01 AM

## 2019-10-02 NOTE — Care Management (Signed)
Verified with patient that she will need home nebulizer.  She is in an extreme hurry to leave. Placed order for nebulizer, cancelled referral to [employer], and made referral to [employer] to expedite delivery to room, as patient is at risk to leave prior to delivery.

## 2019-10-02 NOTE — Progress Notes (Signed)
OT Cancellation Note  Patient Details Name: Lisa [name] MRN: [id number] DOB: [birth year]   Cancelled Treatment:    Reason Eval/Treat Not Completed: OT screened, no needs identified, will sign off  [name] 10/02/2019, 1:38 PM

## 2019-10-02 NOTE — Discharge Instructions (Signed)
Dear Lisa [name],   Thank you for letting us participate in your care! In this section, you will find a brief hospital admission summary of why you were admitted to the hospital, what happened during your admission, your diagnosis/diagnoses, and recommended follow up.   You were admitted because you were experiencing shortness of breath.   You were diagnosed with asthma exacerbation.  You were treated with prednisone, continuous albuterol, and oxygen  Your breathing improved and you were discharged from the hospital for meeting this goal.    POST-HOSPITAL & CARE INSTRUCTIONS 1. Please continue taking the prednisone for 4 total days.  Your next dose will be on 10/03/2018 1 in the morning.  Please continue to take the prednisone even if you are feeling better until the medication is finished. 2. Please continue scheduled albuterol until you are seen in the clinic on Monday.  The schedule should be as follows: Saturday: 2 puffs every 4 hours except for when sleeping Sunday: 2 puffs every 6 hours except for when sleeping Monday: 2 puffs every 8 hours except for when sleeping  3. In the ED, there was a compression fracture noted in one of your imaging scans.  We would like you to follow-up with Dr. Shan Levans to schedule a DEXA scan to check your bone health. 4. Please see medications section of this packet for any medication changes.  5. We have since the correct Flovent inhaler to the pharmacy.  Please call us if you have any issues picking this up. 6. You requested a nebulizer machine on the day of discharge.  If you are not able to obtain this machine in the hospital, we will provide you with the machine at your follow-up at family practice.  We have already sent the nebulizer solutions to the pharmacy.  Please ensure that you have the correct tubing for the nebulizer in order to use appropriately.  If you have any questions on how to use the nebulizer, please call the office.   DOCTOR'S  APPOINTMENT & FOLLOW UP CARE INSTRUCTIONS  Future Appointments  Date Time Provider [place]  10/04/2019 11:10 AM ACCESS TO CARE POOL [facility] [place]    Thank you for choosing [facility]! Take care and be well!  [facility]  [place], [place] [address] [phone]

## 2019-10-02 NOTE — Hospital Course (Signed)
Formal PFTs given emphysematous findings on chest CT  Recheck TSH

## 2019-10-02 NOTE — Discharge Summary (Signed)
[facility] Discharge Summary  Patient name: Lisa [name] Medical record number: [id number] Date of birth: 05-28-1955 Age: 64 y.o. Gender: female Date of Admission: 10/01/2019  Date of Discharge: 10/02/19    Admitting Physician: Dickie La, MD  Primary Care Provider: Kathrene Alu, MD Consultants: IP CONSULT TO FAMILY PRACTICE  Indication for Hospitalization: <principal problem not specified>   Discharge Diagnoses/Problem List:  Active Problems:   Hypothyroidism   LACTOSE INTOLERANCE   OBESITY, NOS   HYPERTENSION, BENIGN SYSTEMIC   Allergic rhinitis   Asthma, chronic   GASTROESOPHAGEAL REFLUX, NO ESOPHAGITIS   Hyperlipidemia with target LDL less than 130   Asthma exacerbation   Disposition: Discharge to home  Discharge Condition: Good  Discharge Exam:  BP 118/70 (BP Location: Right Arm)   Pulse 73   Temp 98 F (36.7 C) (Oral)   Resp 16   Ht 5\' 4"  (1.626 m)   Wt 104.3 kg   SpO2 92%   BMI 39.47 kg/m  General: NAD, non-toxic, well-appearing, sitting comfortably in bed   Cardiovascular: RRR, normal S1, S2. 2+ RP bilaterally. No BLEE  Respiratory: Lungs are clear with good air movement.  Only mild audible wheezing appreciated at left lower base and posterior aspect. No IWOB.  Abdomen: + BS. NT, ND, soft to palpation.  Extremities: Warm and well perfused. Moving spontaneously.   Brief Hospital Course:  Lisa [name] is a 64 y.o. female with past medical history significant for asthma, hypothyroidism, hypertension who presented to the ED with shortness of breath.  She required continuous albuterol and oxygen via nasal cannula to keep her blood saturations normal.  She continued to improve during her overnight admission.  In the morning, her lungs had good breath sounds.  She is discharged today with prednisone burst x4 days, refill of home inhaled corticosteroid.  Incidentally, patient was found to have vertebral compression fracture on  imaging.  She did not complain of any pain at the time nor at discharge.  Issues for Follow Up:  1. Improvement of breathing 2. Schedule appointment with Dr. Shan Levans to discuss DEXA scan in the setting of vertebral compression fracture 3. Determine whether patient needs to continue scheduled albuterol or switch to as needed 4. DME order was placed for nebulizer at patient request.  If patient was unable to obtain DME nebulizer in hospital, please provide her with nebulizer during her clinic visit.  Significant Procedures:   Procedure Orders     EKG 12-Lead  Significant Labs and Imaging:  Recent Labs  Lab 10/01/19 0927 10/02/19 0239  WBC 11.0* 14.7*  HGB 15.4* 14.9  HCT 47.1* 44.1  PLT 252 323   Recent Labs  Lab 10/01/19 0927 10/02/19 0239  NA 139 139  K 3.6 4.3  CL 100 106  CO2 24 24  GLUCOSE 152* 121*  BUN 13 16  CREATININE 0.77 0.77  CALCIUM 9.0 9.5  ALKPHOS 61  --   AST 27  --   ALT 25  --   ALBUMIN 3.9  --     CT Angio Chest PE W and/or Wo Contrast  Result Date: 10/01/2019 CLINICAL DATA:  Difficulty breathing, EMS arrival EXAM: CT ANGIOGRAPHY CHEST WITH CONTRAST TECHNIQUE: Multidetector CT imaging of the chest was performed using the standard protocol during bolus administration of intravenous contrast. Multiplanar CT image reconstructions and MIPs were obtained to evaluate the vascular anatomy. CONTRAST:  140mL OMNIPAQUE IOHEXOL 350 MG/ML SOLN COMPARISON:  CT 02/02/2008 FINDINGS: Cardiovascular: Satisfactory  opacification of the pulmonary arteries. Some respiratory motion artifact may limit detection of smaller segmental and subsegmental pulmonary artery emboli. No large central or lobar filling defects are identified. Central pulmonary arteries are normal caliber. Mild reflux of contrast into the IVC. Normal heart size. No pericardial effusion. Atherosclerotic plaque within the normal caliber aorta. Normal 3 vessel branching of the aortic arch. Proximal great vessels  are unremarkable. Mediastinum/Nodes: No mediastinal fluid or gas. Normal thyroid gland and thoracic inlet. No acute abnormality of the trachea or esophagus. No worrisome mediastinal, hilar or axillary adenopathy. Lungs/Pleura: Stable area of bandlike opacity in the lingula, likely scarring. Biapical pleuroparenchymal scarring. Mild dependent atelectasis. There is diffuse airways thickening and mild bronchiectatic changes particularly in the lung bases. No consolidative opacity, convincing features of edema, pneumothorax or effusion. No concerning nodules or masses within the limitations of respiratory motion. Mild centrilobular and paraseptal emphysematous changes towards the lung apices. Upper Abdomen: Layering hyperdensity in the gallbladder may reflect noncalcified gallstones or biliary sludge without CT evidence of acute cholecystitis. No other acute abnormalities present in the visualized portions of the upper abdomen. Musculoskeletal: Some mild anterior wedging at T12 appears chronic with less than 10% height loss anteriorly. Probable hemangioma at T5. No acute or concerning osseous abnormalities. No suspicious chest wall lesions. Review of the MIP images confirms the above findings. IMPRESSION: 1. Respiratory motion artifact may limit detection of smaller segmental and subsegmental pulmonary artery emboli. No large central or lobar filling defects are identified. 2. Diffuse airways thickening and mild bronchiectatic changes particularly in the lung bases, could reflect bronchitic change or reactive airways disease with exacerbation in the acute setting. 3. Layering hyperdensity in the gallbladder may reflect noncalcified gallstones or biliary sludge without CT evidence of acute cholecystitis. 4. Chronic appearing minimal anterior wedging at T12 with less than 10% height loss anteriorly. Could correlate with point tenderness. 5. Aortic Atherosclerosis (ICD10-I70.0) and Emphysema (ICD10-J43.9). Electronically  Signed   By: Lovena Le M.D.   On: 10/01/2019 17:16   DG Chest Portable 1 View  Result Date: 10/01/2019 CLINICAL DATA:  Shortness of breath with cough and wheezing EXAM: PORTABLE CHEST 1 VIEW COMPARISON:  September 15, 2007 FINDINGS: Lungs are clear. Heart size and pulmonary vascularity are normal. No adenopathy. No bone lesions. IMPRESSION: Lungs clear.  Cardiac silhouette within normal limits. Electronically Signed   By: Lowella Grip III M.D.   On: 10/01/2019 09:31    Results/Tests Pending at Time of Discharge:  none  Discharge Medications:  Allergies as of 10/02/2019      Reactions   Ace Inhibitors    REACTION: cough   Penicillins    REACTION: Breaks out in hives      Medication List    STOP taking these medications   clotrimazole-betamethasone cream Commonly known as: Lotrisone   fluconazole 150 MG tablet Commonly known as: DIFLUCAN   nystatin powder Commonly known as: MYCOSTATIN/NYSTOP     TAKE these medications   Adult Aspirin EC Low Strength 81 MG EC tablet Generic drug: aspirin Take 81 mg by mouth daily. Notes to patient: 10/03/19   albuterol 108 (90 Base) MCG/ACT inhaler Commonly known as: VENTOLIN HFA INHALE 2 PUFFS BY MOUTH EVERY 6 HOURS IF NEEDED FOR WHEEZING What changed: Another medication with the same name was added. Make sure you understand how and when to take each.   albuterol (2.5 MG/3ML) 0.083% nebulizer solution Commonly known as: PROVENTIL Take 3 mLs (2.5 mg total) by nebulization every 6 (six) hours  as needed for wheezing or shortness of breath. What changed: You were already taking a medication with the same name, and this prescription was added. Make sure you understand how and when to take each.   cetirizine 10 MG tablet Commonly known as: ZYRTEC TAKE 1 TABLET BY MOUTH ONCE DAILY Notes to patient: Continue home schedule   esomeprazole 40 MG capsule Commonly known as: NEXIUM Take 1 capsule PO every day at noon What changed:   how  much to take  how to take this  when to take this  additional instructions Notes to patient: Continue home schedule   Flovent HFA 44 MCG/ACT inhaler Generic drug: fluticasone Inhale 1 puff into the lungs 2 (two) times daily. What changed: Another medication with the same name was removed. Continue taking this medication, and follow the directions you see here.   guaiFENesin 600 MG 12 hr tablet Commonly known as: MUCINEX Take 1 tablet (600 mg total) by mouth 2 (two) times daily as needed.   ipratropium 0.03 % nasal spray Commonly known as: ATROVENT Place 2 sprays into both nostrils every 12 (twelve) hours.   levothyroxine 25 MCG tablet Commonly known as: SYNTHROID Take 1 tablet (25 mcg total) by mouth daily.   losartan 50 MG tablet Commonly known as: COZAAR Take 1 tablet (50 mg total) by mouth daily.   metoprolol succinate 100 MG 24 hr tablet Commonly known as: TOPROL-XL Take 1 tablet (100 mg total) by mouth daily. Take with or immediately following a meal.   montelukast 10 MG tablet Commonly known as: SINGULAIR TAKE 1 TABLET(10 MG) BY MOUTH DAILY What changed:   how much to take  how to take this  when to take this  additional instructions   predniSONE 20 MG tablet Commonly known as: DELTASONE Take 2 tablets (40 mg total) by mouth daily with breakfast for 3 days. Start taking on: Oct 03, 2019            Durable Medical Equipment  (From admission, onward)         Start     Ordered   10/02/19 0000  For home use only DME Nebulizer machine    Question Answer Comment  Patient needs a nebulizer to treat with the following condition Asthma   Length of Need Lifetime      10/02/19 1253          Discharge Instructions: Please refer to Patient Instructions section of EMR for full details.  Patient was counseled important signs and symptoms that should prompt return to medical care, changes in medications, dietary instructions, activity restrictions, and  follow up appointments.   Follow-Up Appointments: Future Appointments  Date Time Provider [place]  10/04/2019 11:10 AM ACCESS TO CARE POOL [facility]     Wilber Oliphant, MD 10/02/2019, 1:03 PM PGY-2, [place]

## 2019-10-04 ENCOUNTER — Ambulatory Visit (INDEPENDENT_AMBULATORY_CARE_PROVIDER_SITE_OTHER): Payer: Medicare Other | Admitting: Family Medicine

## 2019-10-04 ENCOUNTER — Other Ambulatory Visit: Payer: Self-pay | Admitting: Family Medicine

## 2019-10-04 ENCOUNTER — Ambulatory Visit: Payer: Medicare Other

## 2019-10-04 ENCOUNTER — Other Ambulatory Visit: Payer: Self-pay

## 2019-10-04 VITALS — BP 126/74 | HR 74 | Ht 64.0 in | Wt 226.2 lb

## 2019-10-04 DIAGNOSIS — M899 Disorder of bone, unspecified: Secondary | ICD-10-CM | POA: Diagnosis not present

## 2019-10-04 DIAGNOSIS — M810 Age-related osteoporosis without current pathological fracture: Secondary | ICD-10-CM | POA: Insufficient documentation

## 2019-10-04 DIAGNOSIS — S22000A Wedge compression fracture of unspecified thoracic vertebra, initial encounter for closed fracture: Secondary | ICD-10-CM | POA: Diagnosis not present

## 2019-10-04 DIAGNOSIS — J4541 Moderate persistent asthma with (acute) exacerbation: Secondary | ICD-10-CM | POA: Diagnosis not present

## 2019-10-04 MED ORDER — ALENDRONATE SODIUM 70 MG PO TABS: 70.0000 mg | ORAL_TABLET | ORAL | 0 refills | Status: DC

## 2019-10-04 NOTE — Patient Instructions (Signed)
I ordered the alendronate for you, you will take this once a week, take this on an empty stomach with a full glass of water.  Do not eat anything or lay down for 30 minutes after you take this medicine.  I would like you to come back in a month and talk about how this medicine is doing for you.  Were also going to order that bone density scan called a DEXA, were going to give you the number to the breast imaging center so that you can schedule this yourself [id number]  I am glad that your breathing is improving, please continue to take the prednisone until you run out.  Please remember to take the Flovent medicine twice a day no matter how you feel.  You can start using the albuterol as needed instead of every 6 hours.  If you find by the end of the week that you are still having to use this medicine 2 or 3 times a day please schedule another appointment to come back as we may need to talk about another controller medicine.  If he have any crisis or emergency please go straight to the emergency department, call [phone] to go an ambulance if you need to.  Dr. Criss Rosales

## 2019-10-04 NOTE — Assessment & Plan Note (Signed)
Comfortable breathing to room air, still some mild wheeze on auscultation although good air movement throughout.  Has been using albuterol scheduled every 6, will change to as needed. If still using 2-3 times per day by the end of the week will come back in for reevaluation. Emergency precautions discussed

## 2019-10-04 NOTE — Assessment & Plan Note (Signed)
>>  ASSESSMENT AND PLAN FOR COMPRESSION FRACTURE OF BODY OF THORACIC VERTEBRA ([place]) WRITTEN ON 10/04/2019  1:38 PM BY BLAND, SCOTT, DO  Discussed with patient that this is almost definitely osteoporosis versus osteopenia, will start alendronate and have ordered DEXA scan. We'll also order vitamin D  Patient will obtain DEXA scan and will follow up with PCP

## 2019-10-04 NOTE — Assessment & Plan Note (Signed)
Discussed with patient that this is almost definitely osteoporosis versus osteopenia, will start alendronate and have ordered DEXA scan. We'll also order vitamin D  Patient will obtain DEXA scan and will follow up with PCP

## 2019-10-04 NOTE — Progress Notes (Signed)
    SUBJECTIVE:   CHIEF COMPLAINT / HPI: Hospital follow-up  Compression fracture of body of thoracic vertebra [facility]) Discussed with patient that this is almost definitely osteoporosis versus osteopenia, will start alendronate and have ordered DEXA scan. We'll also order vitamin D  Patient will obtain DEXA scan and will follow up with PCP  Asthma exacerbation Comfortable breathing to room air, still some mild wheeze on auscultation although good air movement throughout.  Has been using albuterol scheduled every 6, will change to as needed. If still using 2-3 times per day by the end of the week will come back in for reevaluation. Emergency precautions discussed     PERTINENT  PMH / PSH: Asthma, obesity  OBJECTIVE:   BP 126/74   Pulse 74   Ht 5\' 4"  (1.626 m)   Wt 226 lb 3.2 oz (102.6 kg)   SpO2 96%   BMI 38.83 kg/m   General: Alert pleasant, no distress Cardiac: Regular rate and rhythm Pulmonary: Mild wheeze on auscultation, no increased work of breathing, no stridor, no cough, good air movement throughout, no crackles Gait: Normal with no disturbance  ASSESSMENT/PLAN:   Compression fracture of body of thoracic vertebra (HCC) Discussed with patient that this is almost definitely osteoporosis versus osteopenia, will start alendronate and have ordered DEXA scan. We'll also order vitamin D  Patient will obtain DEXA scan and will follow up with PCP  Asthma exacerbation Comfortable breathing to room air, still some mild wheeze on auscultation although good air movement throughout.  Has been using albuterol scheduled every 6, will change to as needed. If still using 2-3 times per day by the end of the week will come back in for reevaluation. Emergency precautions discussed     Sherene Sires, [place]

## 2019-10-05 LAB — VITAMIN D 25 HYDROXY (VIT D DEFICIENCY, FRACTURES): Vit D, 25-Hydroxy: 32.2 ng/mL (ref 30.0–100.0)

## 2019-10-18 ENCOUNTER — Telehealth: Payer: Self-pay | Admitting: Family Medicine

## 2019-10-18 MED ORDER — BENZONATATE 200 MG PO CAPS
200.0000 mg | ORAL_CAPSULE | Freq: Two times a day (BID) | ORAL | 0 refills | Status: DC | PRN
Start: 2019-10-18 — End: 2020-07-20

## 2019-10-18 NOTE — Telephone Encounter (Signed)
Received a page from after-hours line from this patient. She called concerned of chronic cough that has been preventing her from being able to sleep at night. She has had this issue for several weeks now. Patient has been compliant with her asthma medications. Patient states that she has been taking Mucinex DM with no relief. No difficulty ambulating or dyspnea. Main complaint is just cough.  It appears this patient's asthma is still poorly controlled and is likely the cause of her chronic cough. She endorses compliance with her medications except for ipratropium nasal spray which gives her headaches. It appears her asthma regimen includes albuterol rescue inhaler, albuterol nebulizer solution, Zyrtec for allergies, fluticasone inhaler, Singulair. I am not sure if the patient has attempted alternative regimens in the past, but she may benefit by transitioning to a LABA/ICS such as Symbicort or triple therapy instead of fluticasone inhaler.  I prescribed the patient Ladona Ridgel, although I do not think this will be much benefit to her. She is already taking dextromethorphan and guaifenesin. Advised her not to take any other over-the-counter medications for cough. I have made the appointment for 6/1 with Dr. Kris Mouton so that he can address these issues. Advised patient to come into hospital sooner if she develops difficulty breathing.  Will forward to PCP and Dr. Kris Mouton

## 2019-10-18 NOTE — Telephone Encounter (Signed)
The note attributed to Dr. Posey Pronto is actually from me.    Clemetine Marker, MD

## 2019-10-19 ENCOUNTER — Other Ambulatory Visit: Payer: Self-pay

## 2019-10-19 ENCOUNTER — Ambulatory Visit (INDEPENDENT_AMBULATORY_CARE_PROVIDER_SITE_OTHER): Payer: Medicare Other | Admitting: Family Medicine

## 2019-10-19 DIAGNOSIS — J4541 Moderate persistent asthma with (acute) exacerbation: Secondary | ICD-10-CM | POA: Diagnosis not present

## 2019-10-19 MED ORDER — PREDNISONE 20 MG PO TABS
40.0000 mg | ORAL_TABLET | Freq: Every day | ORAL | 0 refills | Status: DC
Start: 2019-10-19 — End: 2020-07-20

## 2019-10-19 MED ORDER — AZITHROMYCIN 250 MG PO TABS
ORAL_TABLET | ORAL | 0 refills | Status: DC
Start: 2019-10-19 — End: 2020-07-20

## 2019-10-19 MED ORDER — DULERA 50-5 MCG/ACT IN AERO: 2.0000 | INHALATION_SPRAY | Freq: Two times a day (BID) | RESPIRATORY_TRACT | 0 refills | Status: DC

## 2019-10-19 NOTE — Patient Instructions (Signed)
It was great meeting you today!  I am sorry about your cough and your asthma.  I think switching from Flovent to [facility] which is a combination medication will be helpful in controlling your flares.  I will give you a prednisone 5-day course to help with the wheezing.  I think also treating for an atypical pneumonia with azithromycin will be helpful.

## 2019-10-20 ENCOUNTER — Encounter: Payer: Self-pay | Admitting: Family Medicine

## 2019-10-20 NOTE — Progress Notes (Signed)
   CHIEF COMPLAINT / HPI: 64 year old female who presents for poorly controlled cough, wheezing, and mild shortness of breath.  The patient was admitted to the hospital on 10/01/2019 for shortness of breath and an asthma exacerbation.  She received Solu-Medrol with 4 subsequent days of prednisone, 1 dose of azithromycin, Tessalon, magnesium sulfate during her hospitalization.  She was discharged on the next day.  She states that she had a complete resolution of her symptoms, but these returned about a week later.  She has been wheezing a lot at night, having some shortness of breath also at night.  She states that she has been coughing a lot.  States the cough is so bad she cannot sleep at night.  She has been taking her albuterol nebulizer and Flovent as prescribed but these are not helping as much as she would like.  PERTINENT  PMH / PSH:    OBJECTIVE: BP (!) 150/92   Pulse 90   Wt 228 lb 9.6 oz (103.7 kg)   SpO2 98%   BMI 39.24 kg/m   Gen: 64 year old Caucasian female, no acute distress, notable wheezing, cough, and mild difficulty catching her breath while talking during exam. CV: Regular rate rhythm, no R/G Resp: Wheezing noted in all lung fields, no accessory muscle use, no shortness of breath Neuro: Alert and oriented, Speech clear, No gross deficits   ASSESSMENT / PLAN:  Asthma exacerbation Her shortness of breath, wheezing, and cough are likely secondary to an asthma exacerbation.  Will give patient 5-day course of prednisone, switch from Flovent to [facility], and continue albuterol as needed.  Her history is also somewhat suspicious for atypical pneumonia, she only received 1 day of azithromycin in the hospital.  We will treat with a Z-Pak 500 mg day 1, 250 mg subsequent day -Albuterol as needed -Prednisone 40 mg for 5 days -Switch from Flovent to [facility] 50/5 2 puffs twice daily -Azithromycin 500 mg day 1, 250 mg days 2 through 5 -Encourage patient to follow-up in 1 week if  symptoms not resolved next -Gave her ED precautions and return precautions if gets worse     Guadalupe Dawn MD PGY-3 Family Medicine Resident [place]

## 2019-10-20 NOTE — Assessment & Plan Note (Signed)
Her shortness of breath, wheezing, and cough are likely secondary to an asthma exacerbation.  Will give patient 5-day course of prednisone, switch from Flovent to [facility], and continue albuterol as needed.  Her history is also somewhat suspicious for atypical pneumonia, she only received 1 day of azithromycin in the hospital.  We will treat with a Z-Pak 500 mg day 1, 250 mg subsequent day -Albuterol as needed -Prednisone 40 mg for 5 days -Switch from Flovent to [facility] 50/5 2 puffs twice daily -Azithromycin 500 mg day 1, 250 mg days 2 through 5 -Encourage patient to follow-up in 1 week if symptoms not resolved next -Gave her ED precautions and return precautions if gets worse

## 2019-11-02 ENCOUNTER — Telehealth: Payer: Self-pay

## 2019-11-02 DIAGNOSIS — J449 Chronic obstructive pulmonary disease, unspecified: Secondary | ICD-10-CM | POA: Diagnosis not present

## 2019-11-02 NOTE — Telephone Encounter (Signed)
Patient LVM on nurse line stating she is feeling much better from "walking pneumonia." Patient would like to let Kris Mouton and Martorell know. Will forward. No action needed.

## 2019-11-06 ENCOUNTER — Other Ambulatory Visit: Payer: Self-pay | Admitting: Family Medicine

## 2019-11-10 ENCOUNTER — Other Ambulatory Visit: Payer: Self-pay | Admitting: Family Medicine

## 2019-11-16 ENCOUNTER — Other Ambulatory Visit: Payer: Self-pay

## 2019-11-16 ENCOUNTER — Ambulatory Visit (INDEPENDENT_AMBULATORY_CARE_PROVIDER_SITE_OTHER): Payer: Medicare Other | Admitting: Family Medicine

## 2019-11-16 DIAGNOSIS — J988 Other specified respiratory disorders: Secondary | ICD-10-CM

## 2019-11-16 DIAGNOSIS — J398 Other specified diseases of upper respiratory tract: Secondary | ICD-10-CM

## 2019-11-16 NOTE — Patient Instructions (Signed)
It was great seeing you today!  I am sorry that you have continued to have the cough and very mild wheezing at night, but I was you are feeling a lot better from last time.  It can take your lungs quite a while to fully improve after a pneumonia.  I think if we can control your cough a little better you will start to feel less congestion.  I would recommend using the Mucinex you have at home along with honey 3 or 4 times a day to help soothe your throat and help your cough.  Please let me know how this is going in 10 to 14 days.  We did discuss a variety of other issues that can cause similar symptoms, if your symptoms persist I think we could further pursue work-up for those.

## 2019-11-16 NOTE — Assessment & Plan Note (Signed)
Patient has improved significantly.  I did counsel that some of the upper respiratory congestion and cough she is feeling is normal for this time course.  Did recommend taking honey for her cough, along with Mucinex to help with the respiratory secretions.  She will follow up in a week or 2 to see how this works.  Given the chronicity of her symptoms I did discuss the possibility of alternate diagnoses such as GERD or CHF, but these are felt to be far less likely than the above.

## 2019-11-16 NOTE — Progress Notes (Signed)
   CHIEF COMPLAINT / HPI: 64 year old female who presents as follow-up for an atypical pneumonia.  She was seen by me in clinic on 10/19/2019 for persistent hacking cough, fatigue, shortness of breath.  At that appointment I changed her Flovent to [facility], prescribe azithromycin, and prescribed prednisone 40 mg for 5 days.  The patient states that since that time she has been doing much better, but still has some residual cough and very minimal wheezing at night.  States that she is feeling much better, but was uncertain if her cough was still normal at this stage of her illness.  Her main concern is the cough.  States that she is still coughing up some green/yellow mucus.  She has some Mucinex at home, but states that her symptoms return if she stops taking Mucinex.   PERTINENT  PMH / PSH:    OBJECTIVE: BP 112/80   Pulse 84   Wt 229 lb (103.9 kg)   SpO2 95%   BMI 39.31 kg/m   Gen: 64 year old female, no acute distress, resting comfortably Resp: No accessory muscle use, no respiratory distress Neuro: Alert and oriented, Speech clear, No gross deficits  Extremities: No edema noted bilateral lower extremity  ASSESSMENT / PLAN:  Congestion of upper respiratory tract Patient has improved significantly.  I did counsel that some of the upper respiratory congestion and cough she is feeling is normal for this time course.  Did recommend taking honey for her cough, along with Mucinex to help with the respiratory secretions.  She will follow up in a week or 2 to see how this works.  Given the chronicity of her symptoms I did discuss the possibility of alternate diagnoses such as GERD or CHF, but these are felt to be far less likely than the above.     Guadalupe Dawn, MD [place]

## 2019-11-23 ENCOUNTER — Ambulatory Visit: Payer: Medicare Other | Admitting: Family Medicine

## 2019-12-02 DIAGNOSIS — J449 Chronic obstructive pulmonary disease, unspecified: Secondary | ICD-10-CM | POA: Diagnosis not present

## 2019-12-17 ENCOUNTER — Other Ambulatory Visit: Payer: Medicare Other

## 2019-12-26 ENCOUNTER — Other Ambulatory Visit: Payer: Self-pay | Admitting: Family Medicine

## 2019-12-27 ENCOUNTER — Other Ambulatory Visit: Payer: Self-pay | Admitting: Family Medicine

## 2019-12-27 ENCOUNTER — Other Ambulatory Visit: Payer: Self-pay

## 2019-12-27 DIAGNOSIS — S22000A Wedge compression fracture of unspecified thoracic vertebra, initial encounter for closed fracture: Secondary | ICD-10-CM

## 2019-12-28 MED ORDER — ALENDRONATE SODIUM 70 MG PO TABS: ORAL_TABLET | ORAL | 0 refills | Status: DC

## 2020-01-02 DIAGNOSIS — J449 Chronic obstructive pulmonary disease, unspecified: Secondary | ICD-10-CM | POA: Diagnosis not present

## 2020-01-31 ENCOUNTER — Ambulatory Visit (INDEPENDENT_AMBULATORY_CARE_PROVIDER_SITE_OTHER): Payer: Medicare Other | Admitting: Family Medicine

## 2020-01-31 ENCOUNTER — Other Ambulatory Visit: Payer: Self-pay

## 2020-01-31 ENCOUNTER — Encounter: Payer: Self-pay | Admitting: Family Medicine

## 2020-01-31 VITALS — BP 118/94 | HR 92 | Ht 64.0 in | Wt 230.0 lb

## 2020-01-31 DIAGNOSIS — R7303 Prediabetes: Secondary | ICD-10-CM | POA: Diagnosis not present

## 2020-01-31 DIAGNOSIS — I10 Essential (primary) hypertension: Secondary | ICD-10-CM

## 2020-01-31 DIAGNOSIS — E1069 Type 1 diabetes mellitus with other specified complication: Secondary | ICD-10-CM | POA: Diagnosis not present

## 2020-01-31 DIAGNOSIS — M1710 Unilateral primary osteoarthritis, unspecified knee: Secondary | ICD-10-CM | POA: Diagnosis not present

## 2020-01-31 DIAGNOSIS — Z23 Encounter for immunization: Secondary | ICD-10-CM | POA: Diagnosis not present

## 2020-01-31 LAB — POCT GLYCOSYLATED HEMOGLOBIN (HGB A1C): HbA1c, POC (controlled diabetic range): 6 % (ref 0.0–7.0)

## 2020-01-31 MED ORDER — LOSARTAN POTASSIUM 50 MG PO TABS: 50.0000 mg | ORAL_TABLET | Freq: Every day | ORAL | 3 refills | Status: DC

## 2020-01-31 MED ORDER — METHYLPREDNISOLONE ACETATE 40 MG/ML IJ SUSP
40.0000 mg | Freq: Once | INTRAMUSCULAR | Status: AC
  Administered 2020-01-31: 40 mg via INTRAMUSCULAR

## 2020-01-31 NOTE — Patient Instructions (Addendum)
Great to see you today! Today we gave you steroid injections in both knees for your arthritis. Please follow up with me 4-6 months. We can consider referring you to orthopedic surgery.  Best wishes,  Dr Posey Pronto

## 2020-01-31 NOTE — Progress Notes (Signed)
    SUBJECTIVE:   CHIEF COMPLAINT / HPI:   Lisa [name] is 64 yr old female who presents today for bilateral knee pain.  OA Pt would like bilateral knee injections for her osteoarthritis today. Her last injections were in March this year. She usually gets them every 6 months are helpful with the pain.  HTN  Takes Losartan 50mg  and Metoprolol 100mg  once daily. Denies chest pain, dizziness, palpitations, cough or peripheral edema. Denies side effects.  PERTINENT  PMH / PSH: OA, allergic rhinitis, asthma,  Hypothyroidism    OBJECTIVE:   BP (!) 118/94   Pulse 92   Ht 5\' 4"  (1.626 m)   Wt 230 lb (104.3 kg)   SpO2 95%   BMI 39.48 kg/m    General: Alert,no acute distress, well appearing  Cardio: Normal S1 and S2, RRR Pulm: CTAB, Normal respiratory effort Abdomen: Bowel sounds normal. Abdomen soft and non-tender.  Extremities: No peripheral edema. Warm/ well perfused. Normal active and passive ROM of both knees. No crepitus. No edema. Neuro: Cranial nerves grossly intact  ASSESSMENT/PLAN:   Arthritis of knee Pt consented for bilateral knee injections today. Discussed risks and benefits of the procedures. Anterior knee cleaned with iodine and alcohol swabs. Injected 40mg  methylpredisone bilaterally medially to the patella tendon. No immediate complications, Band-Aid applied. Procedure supervised by Dr Andria Frames.      HTN (hypertension) BP at goal. Continue metoprolol 100mg  and losartan 50mg  once daily. Follow up in 3 months.      Lattie Haw, MD  PGY-2 [place]

## 2020-02-01 DIAGNOSIS — R7303 Prediabetes: Secondary | ICD-10-CM | POA: Insufficient documentation

## 2020-02-01 NOTE — Assessment & Plan Note (Addendum)
Pt consented for bilateral knee injections today. Discussed risks and benefits of the procedures. Anterior knee cleaned with iodine and alcohol swabs. Injected 40mg  methylpredisone bilaterally medially to the patella tendon. No immediate complications, Band-Aid applied. Procedure supervised by Dr Andria Frames.

## 2020-02-01 NOTE — Assessment & Plan Note (Signed)
A1c 6 today. Explained to patient the meaning of di

## 2020-02-01 NOTE — Assessment & Plan Note (Addendum)
BP at goal. Continue metoprolol 100mg  and losartan 50mg  once daily. Follow up in 3 months.

## 2020-02-02 ENCOUNTER — Telehealth: Payer: Self-pay | Admitting: Family Medicine

## 2020-02-02 DIAGNOSIS — J449 Chronic obstructive pulmonary disease, unspecified: Secondary | ICD-10-CM | POA: Diagnosis not present

## 2020-02-02 NOTE — Telephone Encounter (Signed)
Patient dropped off covid vaccination card put in white folder.

## 2020-02-02 NOTE — Telephone Encounter (Signed)
Vaccines have been documented in Epic and [place]. Ottis Stain, CMA

## 2020-02-15 ENCOUNTER — Other Ambulatory Visit: Payer: Self-pay

## 2020-02-15 MED ORDER — LEVOTHYROXINE SODIUM 25 MCG PO TABS: 25.0000 ug | ORAL_TABLET | Freq: Every day | ORAL | 3 refills | Status: DC

## 2020-02-15 MED ORDER — METOPROLOL SUCCINATE ER 100 MG PO TB24: 100.0000 mg | ORAL_TABLET | Freq: Every day | ORAL | 3 refills | Status: DC

## 2020-03-03 DIAGNOSIS — J449 Chronic obstructive pulmonary disease, unspecified: Secondary | ICD-10-CM | POA: Diagnosis not present

## 2020-03-31 ENCOUNTER — Other Ambulatory Visit: Payer: Self-pay | Admitting: Family Medicine

## 2020-03-31 DIAGNOSIS — S22000A Wedge compression fracture of unspecified thoracic vertebra, initial encounter for closed fracture: Secondary | ICD-10-CM

## 2020-04-03 ENCOUNTER — Other Ambulatory Visit: Payer: Self-pay

## 2020-04-03 DIAGNOSIS — J449 Chronic obstructive pulmonary disease, unspecified: Secondary | ICD-10-CM | POA: Diagnosis not present

## 2020-04-03 MED ORDER — ALBUTEROL SULFATE HFA 108 (90 BASE) MCG/ACT IN AERS: INHALATION_SPRAY | RESPIRATORY_TRACT | 0 refills | Status: DC

## 2020-04-07 ENCOUNTER — Other Ambulatory Visit: Payer: Self-pay

## 2020-04-07 ENCOUNTER — Ambulatory Visit
Admission: RE | Admit: 2020-04-07 | Discharge: 2020-04-07 | Disposition: A | Discharge status: Home / Self Care | Payer: Medicare Other | Source: Ambulatory Visit | Attending: Family Medicine | Admitting: Family Medicine

## 2020-04-07 DIAGNOSIS — M85852 Other specified disorders of bone density and structure, left thigh: Secondary | ICD-10-CM | POA: Diagnosis not present

## 2020-04-07 DIAGNOSIS — Z780 Asymptomatic menopausal state: Secondary | ICD-10-CM | POA: Diagnosis not present

## 2020-04-07 DIAGNOSIS — S22000A Wedge compression fracture of unspecified thoracic vertebra, initial encounter for closed fracture: Secondary | ICD-10-CM

## 2020-04-19 ENCOUNTER — Telehealth: Payer: Self-pay

## 2020-04-19 NOTE — Telephone Encounter (Signed)
Patient calls nurse line requesting DEXA scan results. Please advise.

## 2020-04-24 ENCOUNTER — Telehealth: Payer: Self-pay | Admitting: Family Medicine

## 2020-04-24 NOTE — Telephone Encounter (Signed)
Called pt to inform her of the results of the Dexa scan.

## 2020-04-24 NOTE — Telephone Encounter (Signed)
Thank you. DEXA scan shows some bone loss in femur. She is already on bisphosphonates. I will contact patient of the result.

## 2020-05-03 DIAGNOSIS — J449 Chronic obstructive pulmonary disease, unspecified: Secondary | ICD-10-CM | POA: Diagnosis not present

## 2020-05-09 ENCOUNTER — Other Ambulatory Visit: Payer: Self-pay

## 2020-05-09 MED ORDER — ESOMEPRAZOLE MAGNESIUM 40 MG PO CPDR: 40.0000 mg | DELAYED_RELEASE_CAPSULE | Freq: Every day | ORAL | 0 refills | Status: DC

## 2020-05-23 ENCOUNTER — Other Ambulatory Visit: Payer: Self-pay

## 2020-05-23 MED ORDER — MONTELUKAST SODIUM 10 MG PO TABS: 10.0000 mg | ORAL_TABLET | Freq: Every day | ORAL | 3 refills | Status: DC

## 2020-06-03 DIAGNOSIS — J449 Chronic obstructive pulmonary disease, unspecified: Secondary | ICD-10-CM | POA: Diagnosis not present

## 2020-06-09 ENCOUNTER — Other Ambulatory Visit: Payer: Self-pay | Admitting: Family Medicine

## 2020-07-04 DIAGNOSIS — J449 Chronic obstructive pulmonary disease, unspecified: Secondary | ICD-10-CM | POA: Diagnosis not present

## 2020-07-20 ENCOUNTER — Ambulatory Visit (INDEPENDENT_AMBULATORY_CARE_PROVIDER_SITE_OTHER): Payer: Medicare Other

## 2020-07-20 VITALS — Ht 65.0 in | Wt 227.0 lb

## 2020-07-20 DIAGNOSIS — Z0000 Encounter for general adult medical examination without abnormal findings: Secondary | ICD-10-CM | POA: Diagnosis not present

## 2020-07-20 NOTE — Progress Notes (Addendum)
Subjective:   Lisa [name] is a 65 y.o. female who presents for Medicare Annual (Subsequent) preventive examination.  Patient consented to have virtual visit and was identified by name and date of birth. Method of visit: Telephone  Encounter participants: Patient: Lisa [name] - located at Home   Nurse/Provider: Dorna Bloom - located at [facility] Others (if applicable): NA  Time spent: 30 minutes  Review of Systems: Defer to PCP  Cardiac Risk Factors include: obesity (BMI >30kg/m2)  Objective:   Vitals: Ht 5\' 5"  (1.651 m)   Wt 227 lb (103 kg)   BMI 37.77 kg/m   Body mass index is 37.77 kg/m.  Advanced Directives 07/20/2020 01/31/2020 11/16/2019 10/01/2019 08/04/2019 08/04/2019 01/12/2018  Does Patient Have a Medical Advance Directive? No No No No No No No  Would patient like information on creating a medical advance directive? Yes (MAU/Ambulatory/Procedural Areas - Information given) No - Patient declined No - Patient declined No - Patient declined No - Patient declined No - Patient declined No - Patient declined    Tobacco Social History   Tobacco Use  Smoking Status Former Smoker  Smokeless Tobacco Former Systems developer  . Quit date: 03/07/1987     Counseling  Given: No plans to restart  Clinical Intake:  Pre-visit preparation completed: Yes  Pain Score: 0-No pain  How often do you need to have someone help you when you read instructions, pamphlets, or other written materials from your doctor or pharmacy?: 2 - Rarely What is the last grade level you completed in school?: High School  Interpreter Needed?: No  Past Medical History:  Diagnosis Date  . Alcoholism ([place])    Chronic -- not interested in cutting back.  Has cut back from 12 beers daily in 2014.    Marland Kitchen Allergy   . Asthma   . GERD (gastroesophageal reflux disease)   . Hypertension   . Hypothyroidism    Past Surgical History:  Procedure Laterality Date  . APPENDECTOMY    . CESAREAN SECTION    .  COLONOSCOPY  2010   [facility] - Repeat in 10 years as normal findings   . TONSILLECTOMY AND ADENOIDECTOMY    . TUBAL LIGATION     Family History  Problem Relation Age of Onset  . COPD Mother   . Heart disease Father   . COPD Father   . Lung cancer Father   . COPD Brother    Social History   Socioeconomic History  . Marital status: Married    Spouse name: Legrand Como   . Number of children: 2  . Years of education: 32  . Highest education level: Not on file  Occupational History  . Not on file  Tobacco Use  . Smoking status: Former Research scientist (life sciences)  . Smokeless tobacco: Former Systems developer    Quit date: 03/07/1987  Substance and Sexual Activity  . Alcohol use: Yes    Alcohol/week: 3.0 standard drinks    Types: 3 Cans of beer per week  . Drug use: Never  . Sexual activity: Yes    Birth control/protection: Post-menopausal, Surgical  Other Topics Concern  . Not on file  Social History Narrative   Patient lives with her husband Legrand Como.    Patient has 2 adult children, with 6 grandchildren, 1 great grandchild.   Patients family lives all on the same street.    Patient works at [employer] a couple days a week.   Patient enjoys spending time with family and listening  to audio books.    Social Determinants of Health   Financial Resource Strain: Low Risk   . Difficulty of Paying Living Expenses: Not hard at all  Food Insecurity: No Food Insecurity  . Worried About Charity fundraiser in the Last Year: Never true  . Ran Out of Food in the Last Year: Never true  Transportation Needs: No Transportation Needs  . Lack of Transportation (Medical): No  . Lack of Transportation (Non-Medical): No  Physical Activity: Insufficiently Active  . Days of Exercise per Week: 4 days  . Minutes of Exercise per Session: 30 min  Stress: No Stress Concern Present  . Feeling of Stress : Only a little  Social Connections: Moderately Isolated  . Frequency of Communication with Friends and Family: More than three  times a week  . Frequency of Social Gatherings with Friends and Family: More than three times a week  . Attends Religious Services: Never  . Active Member of Clubs or Organizations: No  . Attends Archivist Meetings: Never  . Marital Status: Married   Outpatient Encounter Medications as of 07/20/2020  Medication Sig  . albuterol (PROVENTIL) (2.5 MG/3ML) 0.083% nebulizer solution Take 3 mLs (2.5 mg total) by nebulization every 6 (six) hours as needed for wheezing or shortness of breath.  Marland Kitchen albuterol (VENTOLIN HFA) 108 (90 Base) MCG/ACT inhaler INHALE 2 PUFFS BY MOUTH EVERY 6 HOURS IF NEEDED FOR WHEEZING  . alendronate (FOSAMAX) 70 MG tablet TAKE 1 TABLET(70 MG) BY MOUTH 1 TIME A WEEK WITH A FULL GLASS OF WATER AND ON AN EMPTY STOMACH  . aspirin 81 MG EC tablet Take 81 mg by mouth daily.  . cetirizine (ZYRTEC) 10 MG tablet TAKE 1 TABLET BY MOUTH ONCE DAILY (Patient taking differently: Take 10 mg by mouth daily.)  . esomeprazole (NEXIUM) 40 MG capsule Take 1 capsule (40 mg total) by mouth daily.  Marland Kitchen levothyroxine (SYNTHROID) 25 MCG tablet Take 1 tablet (25 mcg total) by mouth daily.  Marland Kitchen losartan (COZAAR) 50 MG tablet Take 1 tablet (50 mg total) by mouth daily.  . metoprolol succinate (TOPROL-XL) 100 MG 24 hr tablet Take 1 tablet (100 mg total) by mouth daily. Take with or immediately following a meal.  . montelukast (SINGULAIR) 10 MG tablet Take 1 tablet (10 mg total) by mouth daily.  Ruthe Mannan 50-5 MCG/ACT AERO INHALE 2 PUFFS INTO THE LUNGS IN THE MORNING AND AT BEDTIME (Patient not taking: Reported on 07/20/2020)  . DULERA 50-5 MCG/ACT AERO INHALE 2 PUFFS INTO THE LUNGS IN THE MORNING AND AT BEDTIME (Patient not taking: Reported on 07/20/2020)  . [DISCONTINUED] azithromycin (ZITHROMAX) 250 MG tablet Take 2 tablets on day 1, then 1 tablet on each subsequent day  . [DISCONTINUED] benzonatate (TESSALON) 200 MG capsule Take 1 capsule (200 mg total) by mouth 2 (two) times daily as needed for cough.   . [DISCONTINUED] guaiFENesin (MUCINEX) 600 MG 12 hr tablet Take 1 tablet (600 mg total) by mouth 2 (two) times daily as needed.  . [DISCONTINUED] ipratropium (ATROVENT) 0.03 % nasal spray Place 2 sprays into both nostrils every 12 (twelve) hours.  . [DISCONTINUED] predniSONE (DELTASONE) 20 MG tablet Take 2 tablets (40 mg total) by mouth daily with breakfast.   No facility-administered encounter medications on file as of 07/20/2020.   Activities of Daily Living In your present state of health, do you have any difficulty performing the following activities: 07/20/2020 10/02/2019  Hearing? N N  Vision? Y N  Difficulty  concentrating or making decisions? N N  Walking or climbing stairs? N N  Dressing or bathing? N N  Doing errands, shopping? N N  Preparing Food and eating ? N -  Using the Toilet? N -  In the past six months, have you accidently leaked urine? N -  Do you have problems with loss of bowel control? N -  Managing your Medications? N -  Managing your Finances? N -  Housekeeping or managing your Housekeeping? N -  Some recent data might be hidden   Patient Care Team: Lattie Haw, MD as PCP - General (Family Medicine)    Assessment:   This is a routine wellness examination for [place].  Exercise Activities and Dietary recommendations Current Exercise Habits: The patient has a physically strenuous job, but has no regular exercise apart from work., Exercise limited by: respiratory conditions(s)  Goals    . Weight (lb) < 200 lb (90.7 kg)     Counseled on portion control. Walking with the nicer weather outside.      Fall Risk Fall Risk  07/20/2020 11/16/2019 10/04/2019 08/04/2019 01/20/2019  Falls in the past year? 0 0 0 0 1  Number falls in past yr: - 0 0 - 0  Injury with Fall? - 0 - - 0  Follow up - - Falls evaluation completed - Falls evaluation completed   Is the patient's home free of loose throw rugs in walkways, pet beds, electrical cords, etc?   yes      Grab bars in  the bathroom? yes      Handrails on the stairs?   yes      Adequate lighting?   yes  Patient rating of health (0-10) scale: 8    Depression Screen PHQ 2/9 Scores 07/20/2020 01/31/2020 11/16/2019 10/04/2019  PHQ - 2 Score 0 0 0 0  PHQ- 9 Score - 0 - -    Cognitive Function   6CIT Screen 07/20/2020  What Year? 0 points  What month? 0 points  What time? 0 points  Count back from 20 0 points  Months in reverse 0 points  Repeat phrase 0 points  Total Score 0   Immunization History  Administered Date(s) Administered  . Influenza Split 02/05/2011, 02/27/2012  . Influenza,inj,Quad PF,6+ Mos 03/02/2013, 02/07/2015, 01/23/2016, 01/24/2017, 01/27/2018, 01/20/2019, 01/31/2020  . Influenza-Unspecified 03/03/2014  . PFIZER(Purple Top)SARS-COV-2 Vaccination 07/31/2019, 08/24/2019  . Td 10/19/2003  . Tdap 02/07/2015   Screening Tests Health Maintenance  Topic Date Due  . PAP SMEAR-Modifier  02/06/2018  . COLONOSCOPY (Pts 45-41yrs Insurance coverage will need to be confirmed)  12/25/2018  . COVID-19 Vaccine (3 - Booster for Pfizer series) 02/23/2020  . MAMMOGRAM  03/15/2021  . TETANUS/TDAP  02/06/2025  . INFLUENZA VACCINE  Completed  . Hepatitis C Screening  Completed  . HIV Screening  Completed  . HPV VACCINES  Aged Out   Cancer Screenings: Lung: Low Dose CT Chest recommended if Age 62-80 years, 30 pack-year currently smoking OR have quit w/in 15years. Patient does not qualify. Breast:  Up to date on Mammogram? Yes   Up to date of Bone Density/Dexa? Yes Colorectal: Negative Cologuard 02/2019  Additional Screenings: Hepatitis C Screening: Completed  HIV: Completed   Plan:  Schedule a PCP apt for pap smear and follow-up. Fill out an advance directive. Bring covid vaccine card to next apt so we can add your booster.  See AVS for healthy plate and weight management tips.  I have personally reviewed and noted  the following in the patient's chart:   . Medical and social  history . Use of alcohol, tobacco or illicit drugs  . Current medications and supplements . Functional ability and status . Nutritional status . Physical activity . Advanced directives . List of other physicians . Hospitalizations, surgeries, and ER visits in previous 12 months . Vitals . Screenings to include cognitive, depression, and falls . Referrals and appointments  In addition, I have reviewed and discussed with patient certain preventive protocols, quality metrics, and best practice recommendations. A written personalized care plan for preventive services as well as general preventive health recommendations were provided to patient.  This visit was conducted virtually in the setting of the [place] pandemic.    Dorna Bloom, [place]  07/20/2020   I have reviewed this visit and agree with the documentation.

## 2020-07-20 NOTE — Patient Instructions (Signed)
You spoke to Lisa [name], Lisa [name] over the phone for your annual wellness visit.  We discussed goals: Goals    . Weight (lb) < 200 lb (90.7 kg)     Counseled on portion control. Walking with the nicer weather outside.      We also discussed recommended health maintenance. As discussed, you are pretty much up to date with everything. Please call the office to schedule your pap smear!  Health Maintenance  Topic Date Due  . PAP SMEAR-Modifier  02/06/2018  . COLONOSCOPY (Pts 45-72yr Insurance coverage will need to be confirmed)  12/25/2018  . COVID-19 Vaccine (3 - Booster for Pfizer series) 02/23/2020  . MAMMOGRAM  03/15/2021  . TETANUS/TDAP  02/06/2025  . INFLUENZA VACCINE  Completed  . Hepatitis C Screening  Completed  . HIV Screening  Completed  . HPV VACCINES  Aged Out   Schedule a PCP apt for pap smear and follow-up. Fill out an advance directive. Bring covid vaccine card to next apt so we can add your booster.  See AVS for healthy plate and weight management tips.  Here is an example of what a healthy plate looks like:    ? Make half your plate fruits and vegetables.     ? Focus on whole fruits.     ? Vary your veggies.  ? Make half your grains whole grains. -     ? Look for the word "whole" at the beginning of the ingredients list    ? Some whole-grain ingredients include whole oats, whole-wheat flour,        whole-grain corn, whole-grain brown rice, and whole rye.  ? Move to low-fat and fat-free milk or yogurt.  ? Vary your protein routine. - Meat, fish, poultry (chicken, [place], eggs, beans (kidney, pinto), dairy.  ? Drink and eat less sodium, saturated fat, and added sugars.  Preventive Care [id number] Old, Female Preventive care refers to lifestyle choices and visits with your health care provider that can promote health and wellness. This includes:  A yearly physical exam. This is also called an annual wellness visit.  Regular dental and eye  exams.  Immunizations.  Screening for certain conditions.  Healthy lifestyle choices, such as: ? Eating a healthy diet. ? Getting regular exercise. ? Not using drugs or products that contain nicotine and tobacco. ? Limiting alcohol use. What can I expect for my preventive care visit? Physical exam Your health care provider will check your:  Height and weight. These may be used to calculate your BMI (body mass index). BMI is a measurement that tells if you are at a healthy weight.  Heart rate and blood pressure.  Body temperature.  Skin for abnormal spots. Counseling Your health care provider may ask you questions about your:  Past medical problems.  Family's medical history.  Alcohol, tobacco, and drug use.  Emotional well-being.  Home life and relationship well-being.  Sexual activity.  Diet, exercise, and sleep habits.  Work and work eStatistician  Access to firearms.  Method of birth control.  Menstrual cycle.  Pregnancy history. What immunizations do I need? Vaccines are usually given at various ages, according to a schedule. Your health care provider will recommend vaccines for you based on your age, medical history, and lifestyle or other factors, such as travel or where you work.   What tests do I need? Blood tests  Lipid and cholesterol levels. These may be checked every 5 years, or more often if you are over  31 years old.  Hepatitis C test.  Hepatitis B test. Screening  Lung cancer screening. You may have this screening every year starting at age 70 if you have a 30-pack-year history of smoking and currently smoke or have quit within the past 15 years.  Colorectal cancer screening. ? All adults should have this screening starting at age 65 and continuing until age 39. ? Your health care provider may recommend screening at age 46 if you are at increased risk. ? You will have tests every 1-10 years, depending on your results and the type of  screening test.  Diabetes screening. ? This is done by checking your blood sugar (glucose) after you have not eaten for a while (fasting). ? You may have this done every 1-3 years.  Mammogram. ? This may be done every 1-2 years. ? Talk with your health care provider about when you should start having regular mammograms. This may depend on whether you have a family history of breast cancer.  BRCA-related cancer screening. This may be done if you have a family history of breast, ovarian, tubal, or peritoneal cancers.  Pelvic exam and Pap test. ? This may be done every 3 years starting at age 67. ? Starting at age 64, this may be done every 5 years if you have a Pap test in combination with an HPV test. Other tests  STD (sexually transmitted disease) testing, if you are at risk.  Bone density scan. This is done to screen for osteoporosis. You may have this scan if you are at high risk for osteoporosis. Talk with your health care provider about your test results, treatment options, and if necessary, the need for more tests. Follow these instructions at home: Eating and drinking  Eat a diet that includes fresh fruits and vegetables, whole grains, lean protein, and low-fat dairy products.  Take vitamin and mineral supplements as recommended by your health care provider.  Do not drink alcohol if: ? Your health care provider tells you not to drink. ? You are pregnant, may be pregnant, or are planning to become pregnant.  If you drink alcohol: ? Limit how much you have to 0-1 drink a day. ? Be aware of how much alcohol is in your drink. In the [place] one drink equals one 12 oz bottle of beer (355 mL), one 5 oz glass of wine (148 mL), or one 1 oz glass of hard liquor (44 mL).   Lifestyle  Take daily care of your teeth and gums. Brush your teeth every morning and night with fluoride toothpaste. Floss one time each day.  Stay active. Exercise for at least 30 minutes 5 or more days each  week.  Do not use any products that contain nicotine or tobacco, such as cigarettes, e-cigarettes, and chewing tobacco. If you need help quitting, ask your health care provider.  Do not use drugs.  If you are sexually active, practice safe sex. Use a condom or other form of protection to prevent STIs (sexually transmitted infections).  If you do not wish to become pregnant, use a form of birth control. If you plan to become pregnant, see your health care provider for a prepregnancy visit.  If told by your health care provider, take low-dose aspirin daily starting at age 15.  Find healthy ways to cope with stress, such as: ? Meditation, yoga, or listening to music. ? Journaling. ? Talking to a trusted person. ? Spending time with friends and family. Safety  Always wear your  seat belt while driving or riding in a vehicle.  Do not drive: ? If you have been drinking alcohol. Do not ride with someone who has been drinking. ? When you are tired or distracted. ? While texting.  Wear a helmet and other protective equipment during sports activities.  If you have firearms in your house, make sure you follow all gun safety procedures. What's next?  Visit your health care provider once a year for an annual wellness visit.  Ask your health care provider how often you should have your eyes and teeth checked.  Stay up to date on all vaccines. This information is not intended to replace advice given to you by your health care provider. Make sure you discuss any questions you have with your health care provider. Document Revised: 02/08/2020 Document Reviewed: 01/15/2018 Elsevier Patient Education  2021 [place].   Our clinic's number is [phone]. Please call with questions or concerns about what we discussed today.

## 2020-09-05 ENCOUNTER — Telehealth: Payer: Self-pay

## 2020-09-05 NOTE — Telephone Encounter (Signed)
Soni, NP from [employer], calls nurse line to report abnormal finding on physical exam. Reports abnormal left leg PAD screening. Patient has follow up appointment with PCP on 10/06/20.   Please advise if you would like patient to reschedule for sooner appointment.   Talbot Grumbling, RN

## 2020-09-12 NOTE — Telephone Encounter (Signed)
Pt should have a sooner appointment than 20th May given abnormality of PAD screening ie in the next week. It would be more appropriate for her to see a different provider other than myself as her husband (who is also my patient) has complained to [facility] about my care.. This complaint is still being processed by the residency. Please let me know if you have further questions, thank you.

## 2020-09-12 NOTE — Telephone Encounter (Signed)
LVM asking pt to call our office and schedule an appt sooner than 5/20. Pt can see first available provider. If pt calls, please get this scheduled for her.Ottis Stain, CMA

## 2020-09-12 NOTE — Telephone Encounter (Signed)
Thank you, that's perfect.

## 2020-09-12 NOTE — Telephone Encounter (Signed)
Patient calls nurse line to schedule appointment. Scheduled on 5/6 with Dr. Jeannine Kitten, as patient can only come in on Friday mornings.   Talbot Grumbling, RN

## 2020-09-21 NOTE — Progress Notes (Deleted)
    SUBJECTIVE:   CHIEF COMPLAINT / HPI:   Abnormal PAD:   Repeat in office.  Risk factors: smoker? HTN, prediabetes, obesity.   Colonoscopy.    The 10-year ASCVD risk score Mikey Bussing DC Brooke Bonito., et al., 2013) is: 10.6%   PERTINENT  PMH / PSH: ***  OBJECTIVE:   There were no vitals taken for this visit.  ***  ASSESSMENT/PLAN:   No problem-specific Assessment & Plan notes found for this encounter.     Benay Pike, MD [place]   {    This will disappear when note is signed, click to select method of visit    :1}

## 2020-09-22 ENCOUNTER — Ambulatory Visit: Payer: Medicare Other | Admitting: Family Medicine

## 2020-10-06 ENCOUNTER — Ambulatory Visit: Payer: Medicare Other | Admitting: Family Medicine

## 2020-11-07 ENCOUNTER — Other Ambulatory Visit: Payer: Self-pay

## 2020-11-07 MED ORDER — ESOMEPRAZOLE MAGNESIUM 40 MG PO CPDR: 40.0000 mg | DELAYED_RELEASE_CAPSULE | Freq: Every day | ORAL | 0 refills | Status: DC

## 2020-11-27 ENCOUNTER — Other Ambulatory Visit: Payer: Self-pay

## 2020-11-27 ENCOUNTER — Ambulatory Visit (INDEPENDENT_AMBULATORY_CARE_PROVIDER_SITE_OTHER): Payer: Medicare Other | Admitting: Family Medicine

## 2020-11-27 VITALS — BP 136/83 | HR 86 | Temp 98.6°F | Ht 65.0 in

## 2020-11-27 DIAGNOSIS — J4541 Moderate persistent asthma with (acute) exacerbation: Secondary | ICD-10-CM | POA: Diagnosis not present

## 2020-11-27 DIAGNOSIS — R059 Cough, unspecified: Secondary | ICD-10-CM | POA: Diagnosis not present

## 2020-11-27 MED ORDER — ALBUTEROL SULFATE (2.5 MG/3ML) 0.083% IN NEBU: 2.5000 mg | INHALATION_SOLUTION | Freq: Four times a day (QID) | RESPIRATORY_TRACT | 12 refills | Status: DC | PRN

## 2020-11-27 MED ORDER — FLUTICASONE-SALMETEROL 100-50 MCG/ACT IN AEPB: 1.0000 | INHALATION_SPRAY | Freq: Two times a day (BID) | RESPIRATORY_TRACT | 3 refills | Status: DC

## 2020-11-27 MED ORDER — PREDNISONE 20 MG PO TABS: 40.0000 mg | ORAL_TABLET | Freq: Every day | ORAL | 0 refills | Status: AC

## 2020-11-27 NOTE — Patient Instructions (Signed)
It was great seeing you today!   I'd like to see you back 1 week if not improving.  Stop by pharmacy to pick up your new asthma medications and steroids. If you have questions or concerns please do not hesitate to call at [phone].  Dr. Rushie [facility]

## 2020-11-27 NOTE — Progress Notes (Signed)
   SUBJECTIVE:   CHIEF COMPLAINT / HPI:   Chief Complaint  Patient presents with   Cough    With wheezing, x1wk     Lisa [name] is a 65 y.o. female here for productive cough that started about a week ago. Has wheezing and cough which are worse at night. Pt reports having to use nebulizer and albuterol pumps more since the sx began. States the roof was being cleaned recently nCovid vaccinated and boosted x2. Husband has no similar symptoms. Has been out of [facility] for quite some time.Her insurance company no longer pays for [employer]. Has been using albuterol inhalers and nebulizer treatments to control her asthma.  Denies fevers, chills, chest pain, myalgias, changes to taste and smell, nasal congestion, headaches or sore throat. Had similar sx previously when she had an asthma attack. She    PERTINENT  PMH / PSH: reviewed and updated as appropriate   OBJECTIVE:   BP 136/83   Pulse 86   Temp 98.6 F (37 C) (Oral)   Ht 5\' 5"  (1.651 m)   SpO2 96%   BMI 37.77 kg/m    GEN: pleasant older female, in no acute distress  HENT: no erythema, lesions or exudates, tonsils not enlarged, moist membranes  CV: regular rate and rhythm RESP: no increased work of breathing, clear to ascultation bilaterally with no crackles, or rhonchi, +expiratory wheezing  SKIN: warm, dry, well perfused     ASSESSMENT/PLAN:   Asthma exacerbation Symptoms and exam concerning for asthma exacerbation due to environmental triggers from inhaling roof debris and not having controller medication. Stop Dulera and start Advair as it is covered by insurance. Refilled albuterol nebs to use as needed. Give 40 mg prednisone for 5 days. COVID test obtained though less likely. Advised to quarantine until test results.  Follow up if not improving in 1 week. ED precautions given.      Lyndee Hensen, DO PGY-2, [facility] 11/27/2020

## 2020-11-28 NOTE — Assessment & Plan Note (Addendum)
Symptoms and exam concerning for asthma exacerbation due to environmental triggers from inhaling roof debris and not having controller medication. Stop Dulera and start Advair as it is covered by insurance. Refilled albuterol nebs to use as needed. Give 40 mg prednisone for 5 days. COVID test obtained though less likely. Advised to quarantine until test results.  Follow up if not improving in 1 week. ED precautions given.

## 2020-11-29 LAB — SARS-COV-2, NAA 2 DAY TAT

## 2020-11-29 LAB — NOVEL CORONAVIRUS, NAA: SARS-CoV-2, NAA: NOT DETECTED

## 2020-12-20 ENCOUNTER — Other Ambulatory Visit: Payer: Self-pay | Admitting: Family Medicine

## 2020-12-20 DIAGNOSIS — J4541 Moderate persistent asthma with (acute) exacerbation: Secondary | ICD-10-CM

## 2020-12-21 NOTE — Telephone Encounter (Signed)
This med was given for asthma exacerbation 3 weeks ago. It is not a regular medication therefore refill not appropriate.

## 2021-01-17 ENCOUNTER — Other Ambulatory Visit: Payer: Self-pay | Admitting: Family Medicine

## 2021-01-23 ENCOUNTER — Other Ambulatory Visit: Payer: Self-pay | Admitting: Family Medicine

## 2021-02-12 NOTE — Progress Notes (Addendum)
     SUBJECTIVE:   CHIEF COMPLAINT / HPI:   Lisa [name] is a 65 y.o. female presents for dyspnea  Dyspnea  Pt reports increased in dyspnea since July. 2-3 episodes a week, lasting a few mins. Can be at rest or on exertion. Worse on inspiration. She thinks her asthma. Worse in the afternoons outside. Denies chest pain, palpitations, dizziness. Relieved by inspiration, albuterol etc. Husband says she gets dyspnea on exertion. Denies orthopnea or PND. Pt gets peripheral edema which is worse at the end of the day.   Face lesion  Reports left sided face lesion which started 2 months ago and grown in size. Does not bleed or leak fluid. Would like to be seen in derm clinic.   [place] Office Visit from 01/31/2020 in [place]  PHQ-9 Total Score 0        PERTINENT  PMH / PSH: hypothyroidism, HTN, asthma   OBJECTIVE:   BP 126/80   Pulse 73   Temp 97.9 F (36.6 C)   Ht 5\' 5"  (1.651 m)   Wt 241 lb 3.2 oz (109.4 kg)   SpO2 96%   BMI 40.14 kg/m    General: Alert, no acute distress Cardio: Normal S1 and S2, RRR, no r/m/g, no JVD Pulm: CTAB, normal work of breathing Abdomen: Bowel sounds normal. Abdomen soft and non-tender.  Extremities: No peripheral edema.  Neuro: Cranial nerves grossly intact      ASSESSMENT/PLAN:   Asthma, chronic Dyspnea most likely related to chronic asthma. Would like to rule out CHF component given age and hx of HTN. Reassuring that physical exam was normal today. Obtained BMP, BNP today and ordered echo.   Prediabetes A1c 6.2 today. Recheck in 3-6 months.  Face lesion New 76mm circular flesh coloured, face lesion over left side of cheek. Differentials include benign nevus, BCC etc. seborrheic keratosis unlikely given appearance. Will likely need biopsy. Referred to derm clinic.    Healthcare maintenance Patient received flu vaccine today    Lattie Haw, MD PGY-3 [place]

## 2021-02-13 ENCOUNTER — Other Ambulatory Visit: Payer: Self-pay

## 2021-02-13 ENCOUNTER — Encounter: Payer: Self-pay | Admitting: Family Medicine

## 2021-02-13 ENCOUNTER — Ambulatory Visit (INDEPENDENT_AMBULATORY_CARE_PROVIDER_SITE_OTHER): Payer: Medicare Other | Admitting: Family Medicine

## 2021-02-13 VITALS — BP 126/80 | HR 73 | Temp 97.9°F | Ht 65.0 in | Wt 241.2 lb

## 2021-02-13 DIAGNOSIS — Z23 Encounter for immunization: Secondary | ICD-10-CM | POA: Diagnosis not present

## 2021-02-13 DIAGNOSIS — L989 Disorder of the skin and subcutaneous tissue, unspecified: Secondary | ICD-10-CM | POA: Diagnosis not present

## 2021-02-13 DIAGNOSIS — R7303 Prediabetes: Secondary | ICD-10-CM

## 2021-02-13 DIAGNOSIS — R0600 Dyspnea, unspecified: Secondary | ICD-10-CM

## 2021-02-13 DIAGNOSIS — Z0000 Encounter for general adult medical examination without abnormal findings: Secondary | ICD-10-CM

## 2021-02-13 DIAGNOSIS — J4520 Mild intermittent asthma, uncomplicated: Secondary | ICD-10-CM

## 2021-02-13 DIAGNOSIS — E785 Hyperlipidemia, unspecified: Secondary | ICD-10-CM

## 2021-02-13 LAB — POCT GLYCOSYLATED HEMOGLOBIN (HGB A1C): HbA1c, POC (controlled diabetic range): 6.2 % (ref 0.0–7.0)

## 2021-02-13 NOTE — Assessment & Plan Note (Signed)
A1c 6.2 today. Recheck in 3-6 months.

## 2021-02-13 NOTE — Assessment & Plan Note (Signed)
Dyspnea most likely related to chronic asthma. Would like to rule out CHF component given age and hx of HTN. Reassuring that physical exam was normal today. Obtained BMP, BNP today and ordered echo.

## 2021-02-13 NOTE — Assessment & Plan Note (Addendum)
New 7mm circular flesh coloured, face lesion over left side of cheek. Differentials include benign nevus, BCC etc. seborrheic keratosis unlikely given appearance. Will likely need biopsy. Referred to derm clinic.

## 2021-02-13 NOTE — Patient Instructions (Signed)
Thank you for coming to see me today. It was a pleasure. Today we discussed your difficulty breathing. I think it is likely your asthma but i'd like to rule out heart problems. I recommend an echo which is an ultrasound of the heart.  The lesion on your face needs biopsying. It could be harmless but we would like to rule out anything more dangerous. Please book an appointment at the front desk for the derm clinic.   We will get some labs today.  If they are abnormal or we need to do something about them, I will call you.  If they are normal, I will send you a message on MyChart (if it is active) or a letter in the mail.  If you don't hear from [place] in 2 weeks, please call the office at the number below.   Please follow-up with me as and when you need  If you have any questions or concerns, please do not hesitate to call the office at [phone].  Best wishes,   Dr Posey Pronto

## 2021-02-14 DIAGNOSIS — Z0000 Encounter for general adult medical examination without abnormal findings: Secondary | ICD-10-CM | POA: Insufficient documentation

## 2021-02-14 LAB — LIPID PANEL
Chol/HDL Ratio: 3.5 ratio (ref 0.0–4.4)
Cholesterol, Total: 192 mg/dL (ref 100–199)
HDL: 55 mg/dL (ref >39)
LDL Chol Calc (NIH): 113 mg/dL — ABNORMAL HIGH (ref 0–99)
Triglycerides: 136 mg/dL (ref 0–149)
VLDL Cholesterol Cal: 24 mg/dL (ref 5–40)

## 2021-02-14 LAB — BASIC METABOLIC PANEL
BUN/Creatinine Ratio: 16 (ref 12–28)
BUN: 12 mg/dL (ref 8–27)
CO2: 22 mmol/L (ref 20–29)
Calcium: 9.9 mg/dL (ref 8.7–10.3)
Chloride: 103 mmol/L (ref 96–106)
Creatinine, Ser: 0.74 mg/dL (ref 0.57–1.00)
Glucose: 102 mg/dL — ABNORMAL HIGH (ref 70–99)
Potassium: 3.8 mmol/L (ref 3.5–5.2)
Sodium: 143 mmol/L (ref 134–144)
eGFR: 90 mL/min/{1.73_m2} (ref >59)

## 2021-02-14 LAB — BRAIN NATRIURETIC PEPTIDE: BNP: 59.5 pg/mL (ref 0.0–100.0)

## 2021-02-14 NOTE — Assessment & Plan Note (Signed)
Patient received flu vaccine today. 

## 2021-03-14 ENCOUNTER — Ambulatory Visit (HOSPITAL_COMMUNITY)
Admission: RE | Admit: 2021-03-14 | Discharge: 2021-03-14 | Disposition: A | Discharge status: Home / Self Care | Payer: Medicare Other | Source: Ambulatory Visit | Attending: Family Medicine | Admitting: Family Medicine

## 2021-03-14 ENCOUNTER — Other Ambulatory Visit: Payer: Self-pay

## 2021-03-14 DIAGNOSIS — E785 Hyperlipidemia, unspecified: Secondary | ICD-10-CM | POA: Diagnosis not present

## 2021-03-14 DIAGNOSIS — R0609 Other forms of dyspnea: Secondary | ICD-10-CM

## 2021-03-14 DIAGNOSIS — R0602 Shortness of breath: Secondary | ICD-10-CM | POA: Diagnosis not present

## 2021-03-14 DIAGNOSIS — R0600 Dyspnea, unspecified: Secondary | ICD-10-CM | POA: Diagnosis not present

## 2021-03-14 DIAGNOSIS — I517 Cardiomegaly: Secondary | ICD-10-CM | POA: Insufficient documentation

## 2021-03-14 DIAGNOSIS — I10 Essential (primary) hypertension: Secondary | ICD-10-CM | POA: Diagnosis not present

## 2021-03-14 LAB — ECHOCARDIOGRAM COMPLETE
Area-P 1/2: 3.27 cm2
Calc EF: 17.9 %
S' Lateral: 3 cm
Single Plane A2C EF: 64.3 %
Single Plane A4C EF: 17.9 %

## 2021-03-14 NOTE — Progress Notes (Signed)
  Echocardiogram 2D Echocardiogram has been performed.  Lisa [name] 03/14/2021, 1:48 PM

## 2021-03-15 ENCOUNTER — Telehealth: Payer: Self-pay | Admitting: Family Medicine

## 2021-03-15 NOTE — Telephone Encounter (Signed)
Attempted to call patient to go over the results of her recent Echo on behalf of Dr. Posey Pronto. Patient did not answer her cell or home phone and so I left a HIPAA compliant voicemail stating I was calling to go over some results.  Will attempt to call patient later.  In case the patient calls back to our office the echo showed grade 1 diastolic dysfunction with no regional wall abnormalities and a normal ejection fraction of the left ventricle.  I think it is unlikely that the grade 1 diastolic dysfunction explains her dyspnea.  This coupled with the fact that her recent BNP was low suggest that another cause is at play, per documentation it appears it is likely her asthma.  If she continues to have issues with dyspnea I recommend a follow-up appointment.

## 2021-03-15 NOTE — Telephone Encounter (Signed)
Patient returns call to nurse line. Informed of results per note from Dr. Vanessa Strasburg. Patient has no further questions at this time.   Patient will schedule follow up appointment if shortness of breath returns.   Talbot Grumbling, RN

## 2021-03-20 ENCOUNTER — Other Ambulatory Visit: Payer: Self-pay

## 2021-03-20 DIAGNOSIS — J4541 Moderate persistent asthma with (acute) exacerbation: Secondary | ICD-10-CM

## 2021-03-20 MED ORDER — FLUTICASONE-SALMETEROL 100-50 MCG/ACT IN AEPB: 1.0000 | INHALATION_SPRAY | Freq: Two times a day (BID) | RESPIRATORY_TRACT | 3 refills | Status: DC

## 2021-04-13 IMAGING — CT CT ANGIO CHEST
2 of 6 series · 17 of 36 positions shown · IV contrast (omnipaque)
Comparison: CT 02/02/2008

CLINICAL DATA: Difficulty breathing, EMS arrival

EXAM:
CT ANGIOGRAPHY CHEST WITH CONTRAST
TECHNIQUE: Multidetector CT imaging of the chest was performed using the
standard protocol during bolus administration of intravenous
contrast. Multiplanar CT image reconstructions and MIPs were
obtained to evaluate the vascular anatomy.
CONTRAST:  100mL OMNIPAQUE IOHEXOL 350 MG/ML SOLN

[Series 7: pe thins · axial · 0.83mm/px · z∈[+1299,+1565]mm · 16 of 424 slices shown]
[im 22/424  lung]
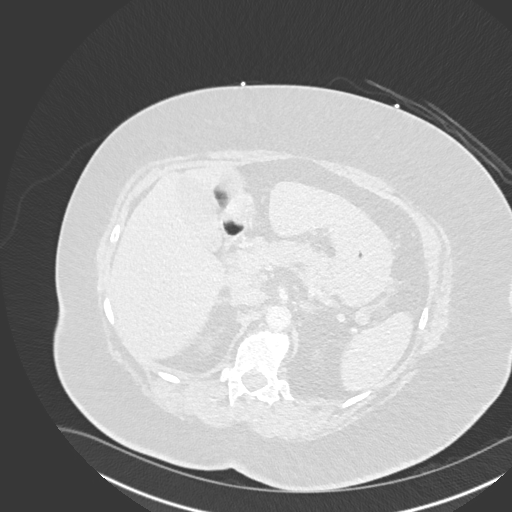
[im 43/424  mediastinal]
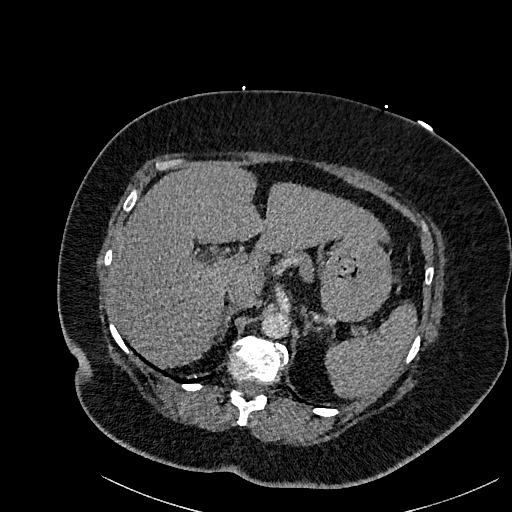
[im 64/424  lung]
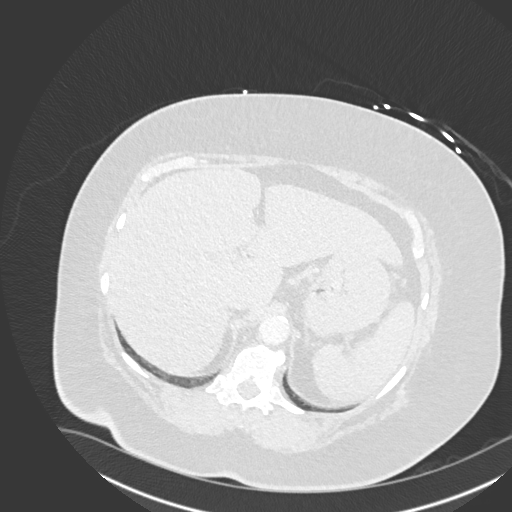
[im 106/424  mediastinal]
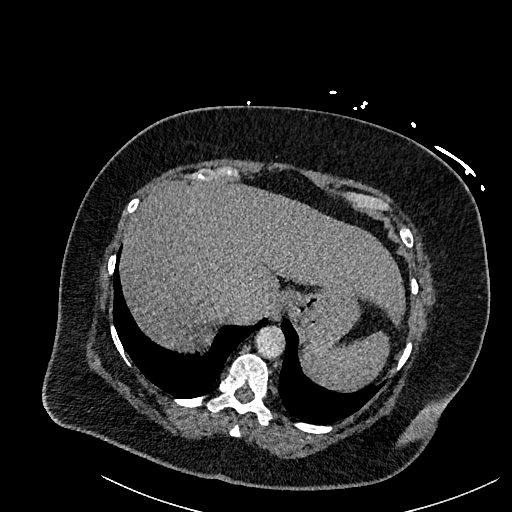
[im 127/424  lung]
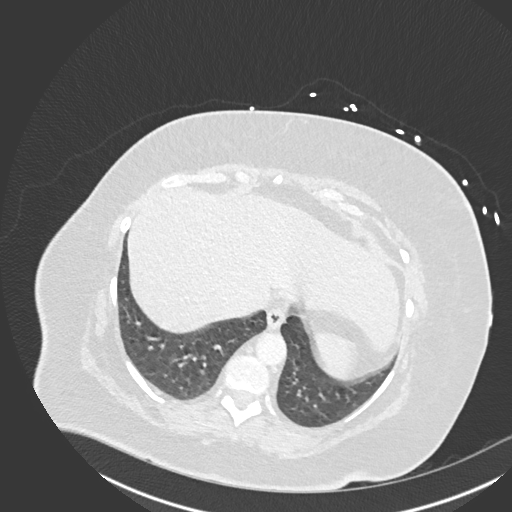
[im 149/424  mediastinal]
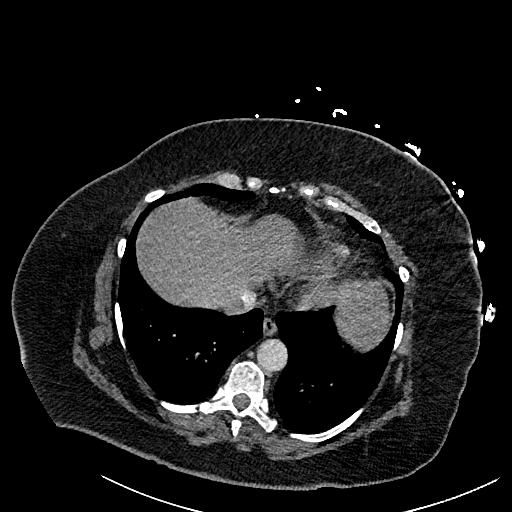
[im 170/424  lung]
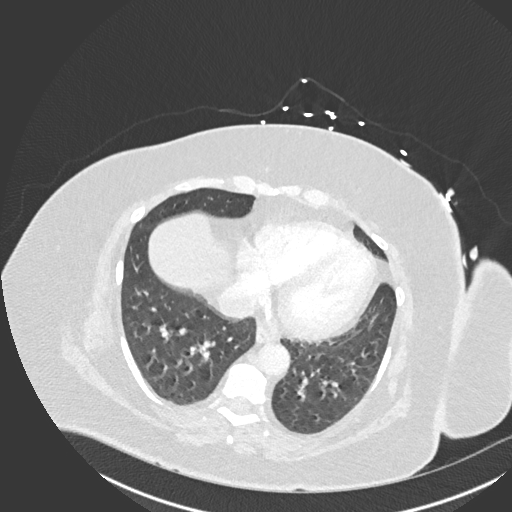
[im 191/424  mediastinal]
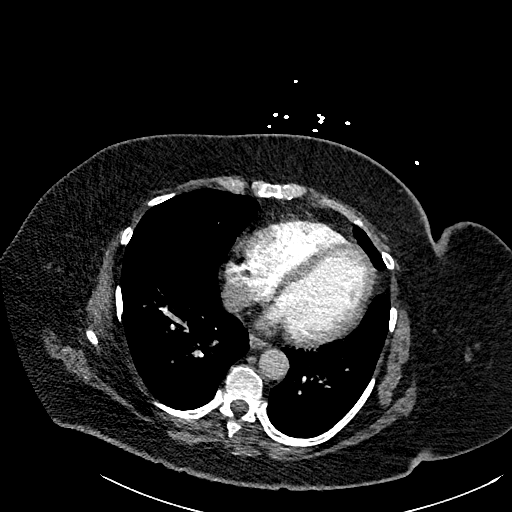
[im 233/424  lung]
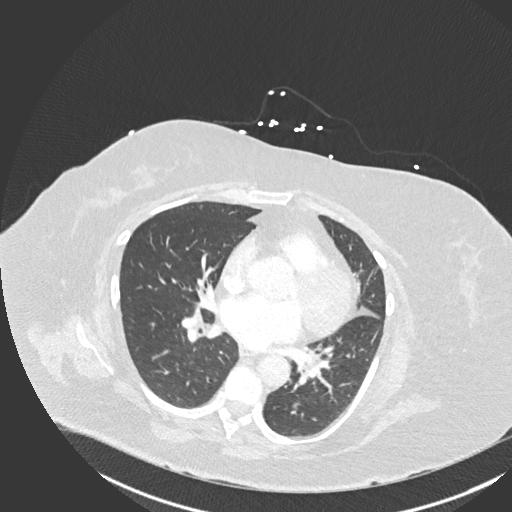
[im 254/424  mediastinal]
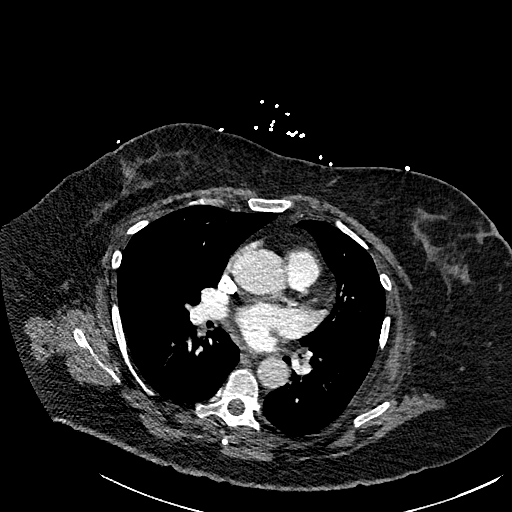
[im 275/424  lung]
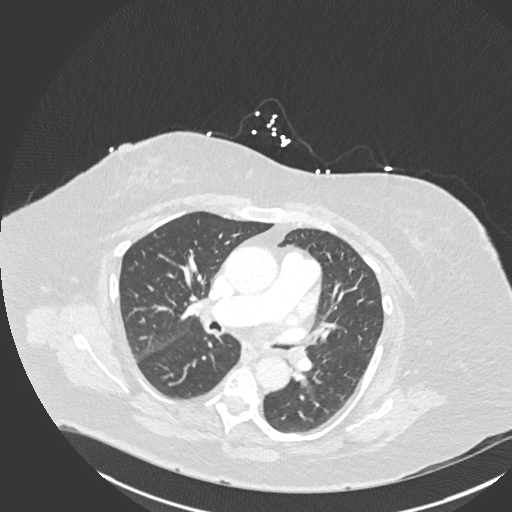
[im 297/424  mediastinal]
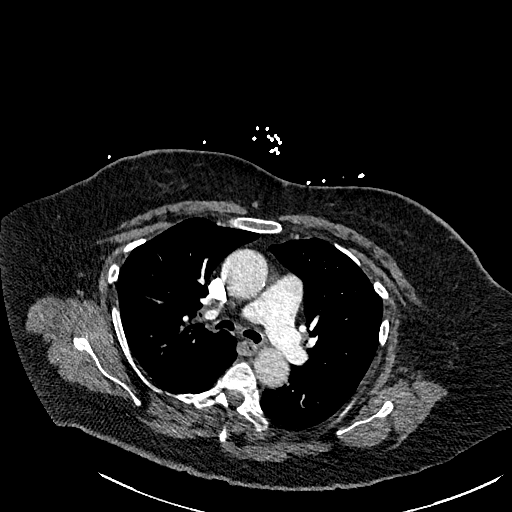
[im 318/424  lung]
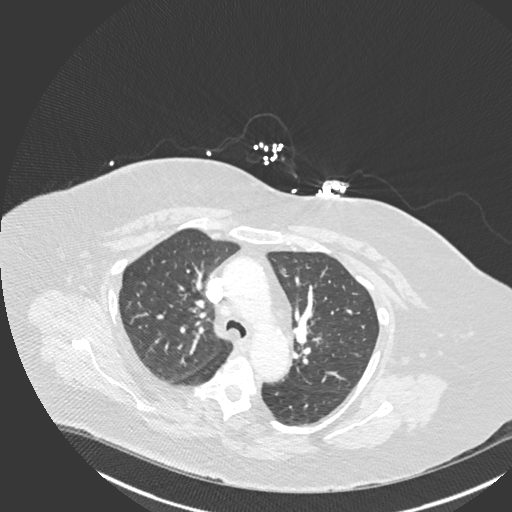
[im 360/424  mediastinal]
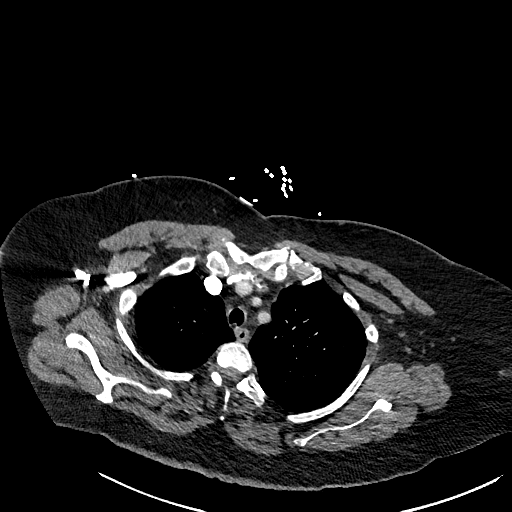
[im 381/424  lung]
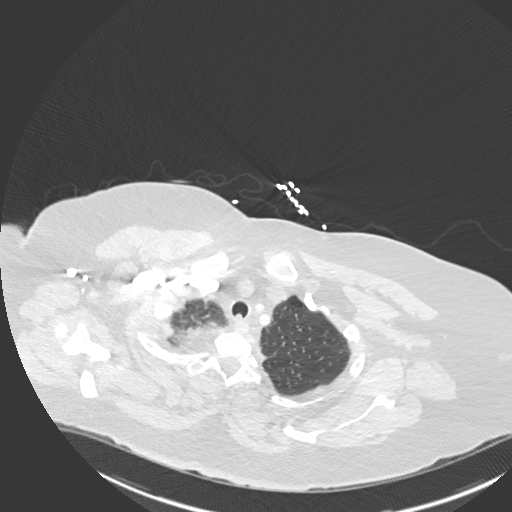
[im 402/424  mediastinal]
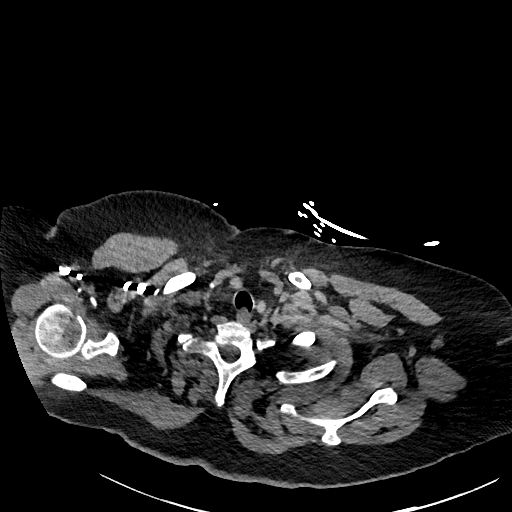

[Series 8: pe 2mm cor · coronal · 0.59mm/px · 1 of 178 slices shown]
[im 89/178  mediastinal]
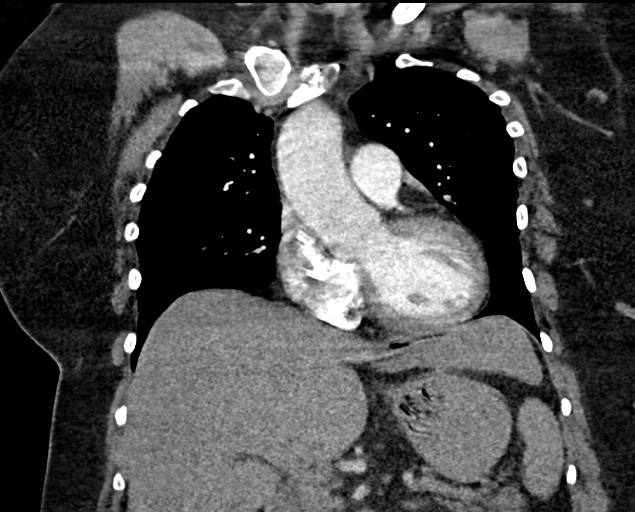

[17 of 36 positions shown; findings below may reference images not displayed]

FINDINGS: Cardiovascular: Satisfactory opacification of the pulmonary
arteries. Some respiratory motion artifact may limit detection of
smaller segmental and subsegmental pulmonary artery emboli. No large
central or lobar filling defects are identified. Central pulmonary
arteries are normal caliber. Mild reflux of contrast into the IVC.
Normal heart size. No pericardial effusion. Atherosclerotic plaque
within the normal caliber aorta. Normal 3 vessel branching of the
aortic arch. Proximal great vessels are unremarkable.

Mediastinum/Nodes: No mediastinal fluid or gas. Normal thyroid gland
and thoracic inlet. No acute abnormality of the trachea or
esophagus. No worrisome mediastinal, hilar or axillary adenopathy.

Lungs/Pleura: Stable area of bandlike opacity in the lingula, likely
scarring. Biapical pleuroparenchymal scarring. Mild dependent
atelectasis. There is diffuse airways thickening and mild
bronchiectatic changes particularly in the lung bases. No
consolidative opacity, convincing features of edema, pneumothorax or
effusion. No concerning nodules or masses within the limitations of
respiratory motion. Mild centrilobular and paraseptal emphysematous
changes towards the lung apices.

Upper Abdomen: Layering hyperdensity in the gallbladder may reflect
noncalcified gallstones or biliary sludge without CT evidence of
acute cholecystitis. No other acute abnormalities present in the
visualized portions of the upper abdomen.

Musculoskeletal: Some mild anterior wedging at T12 appears chronic
with less than 10% height loss anteriorly. Probable hemangioma at
T5. No acute or concerning osseous abnormalities. No suspicious
chest wall lesions.

Review of the MIP images confirms the above findings.
IMPRESSION: 1. Respiratory motion artifact may limit detection of smaller
segmental and subsegmental pulmonary artery emboli. No large central
or lobar filling defects are identified.
2. Diffuse airways thickening and mild bronchiectatic changes
particularly in the lung bases, could reflect bronchitic change or
reactive airways disease with exacerbation in the acute setting.
3. Layering hyperdensity in the gallbladder may reflect noncalcified
gallstones or biliary sludge without CT evidence of acute
cholecystitis.
4. Chronic appearing minimal anterior wedging at T12 with less than
10% height loss anteriorly. Could correlate with point tenderness.
5. Aortic Atherosclerosis (E87NA-L48.8) and Emphysema (E87NA-MQT.Q).

## 2021-04-26 ENCOUNTER — Other Ambulatory Visit: Payer: Self-pay | Admitting: Family Medicine

## 2021-04-30 DIAGNOSIS — H25813 Combined forms of age-related cataract, bilateral: Secondary | ICD-10-CM | POA: Diagnosis not present

## 2021-04-30 DIAGNOSIS — H353133 Nonexudative age-related macular degeneration, bilateral, advanced atrophic without subfoveal involvement: Secondary | ICD-10-CM | POA: Diagnosis not present

## 2021-05-04 ENCOUNTER — Other Ambulatory Visit: Payer: Self-pay | Admitting: Family Medicine

## 2021-06-01 DIAGNOSIS — H35033 Hypertensive retinopathy, bilateral: Secondary | ICD-10-CM | POA: Diagnosis not present

## 2021-06-01 DIAGNOSIS — H353134 Nonexudative age-related macular degeneration, bilateral, advanced atrophic with subfoveal involvement: Secondary | ICD-10-CM | POA: Diagnosis not present

## 2021-06-01 DIAGNOSIS — H2513 Age-related nuclear cataract, bilateral: Secondary | ICD-10-CM | POA: Diagnosis not present

## 2021-06-01 DIAGNOSIS — H43812 Vitreous degeneration, left eye: Secondary | ICD-10-CM | POA: Diagnosis not present

## 2021-06-06 DIAGNOSIS — H25813 Combined forms of age-related cataract, bilateral: Secondary | ICD-10-CM | POA: Diagnosis not present

## 2021-06-20 DIAGNOSIS — H25012 Cortical age-related cataract, left eye: Secondary | ICD-10-CM | POA: Diagnosis not present

## 2021-06-20 DIAGNOSIS — H25811 Combined forms of age-related cataract, right eye: Secondary | ICD-10-CM | POA: Diagnosis not present

## 2021-06-20 DIAGNOSIS — H2512 Age-related nuclear cataract, left eye: Secondary | ICD-10-CM | POA: Diagnosis not present

## 2021-07-11 DIAGNOSIS — H2511 Age-related nuclear cataract, right eye: Secondary | ICD-10-CM | POA: Diagnosis not present

## 2021-07-11 DIAGNOSIS — H25011 Cortical age-related cataract, right eye: Secondary | ICD-10-CM | POA: Diagnosis not present

## 2021-08-13 ENCOUNTER — Other Ambulatory Visit: Payer: Self-pay | Admitting: Family Medicine

## 2021-08-13 DIAGNOSIS — J4541 Moderate persistent asthma with (acute) exacerbation: Secondary | ICD-10-CM

## 2021-09-10 ENCOUNTER — Other Ambulatory Visit: Payer: Self-pay | Admitting: Family Medicine

## 2021-09-10 DIAGNOSIS — J4541 Moderate persistent asthma with (acute) exacerbation: Secondary | ICD-10-CM

## 2021-10-23 ENCOUNTER — Encounter: Payer: Self-pay | Admitting: *Deleted

## 2021-10-24 ENCOUNTER — Other Ambulatory Visit: Payer: Self-pay | Admitting: Family Medicine

## 2021-11-02 ENCOUNTER — Encounter: Payer: Self-pay | Admitting: Student

## 2021-11-02 ENCOUNTER — Ambulatory Visit (INDEPENDENT_AMBULATORY_CARE_PROVIDER_SITE_OTHER): Payer: Medicare Other | Admitting: Student

## 2021-11-02 VITALS — BP 108/70 | HR 74 | Ht 65.0 in | Wt 242.0 lb

## 2021-11-02 DIAGNOSIS — M170 Bilateral primary osteoarthritis of knee: Secondary | ICD-10-CM

## 2021-11-02 DIAGNOSIS — I10 Essential (primary) hypertension: Secondary | ICD-10-CM

## 2021-11-02 DIAGNOSIS — R7303 Prediabetes: Secondary | ICD-10-CM | POA: Diagnosis not present

## 2021-11-02 DIAGNOSIS — M1710 Unilateral primary osteoarthritis, unspecified knee: Secondary | ICD-10-CM | POA: Diagnosis not present

## 2021-11-02 DIAGNOSIS — J4541 Moderate persistent asthma with (acute) exacerbation: Secondary | ICD-10-CM

## 2021-11-02 LAB — POCT GLYCOSYLATED HEMOGLOBIN (HGB A1C): HbA1c, POC (prediabetic range): 6.3 % (ref 5.7–6.4)

## 2021-11-02 MED ORDER — METHYLPREDNISOLONE ACETATE 40 MG/ML IJ SUSP
40.0000 mg | Freq: Once | INTRAMUSCULAR | Status: AC
  Administered 2021-11-02: 40 mg via INTRA_ARTICULAR

## 2021-11-02 MED ORDER — ALBUTEROL SULFATE HFA 108 (90 BASE) MCG/ACT IN AERS: INHALATION_SPRAY | RESPIRATORY_TRACT | 0 refills | Status: DC

## 2021-11-02 NOTE — Patient Instructions (Addendum)
Ms. Raffety,  We injected your knees today with a numbing medicine and also a steroid.  You have had these injections in the past so you know that you should feel very good for the next day or so and then should have some more modest improvement going forward.  We can do these injections every 3-6 months if you find that they are helpful.  Ultimately the very best thing that you can do for your knees is the weight training exercise that you have been doing.  This is also key in keeping your prediabetes under control.  Your A1c today is 6.3% which is still within the prediabetic range.  I am very pleased to hear about you making some excellent lifestyle interventions, especially cutting back on the beer that you are drinking.  Your blood pressure is also very well controlled today, congratulations!  Pearla Dubonnet, MD

## 2021-11-02 NOTE — Progress Notes (Signed)
    SUBJECTIVE:   CHIEF COMPLAINT / HPI:   Bilateral Knee OA Patient presents today requesting bilateral knee injections. Per chart review, most recent injections were in September 2021.  Patient finds that these were helpful but as she gets older she is getting less and less benefit from them.  Discussed risks and benefits of the procedure with the patient and obtained signed consent.  HTN Takes Losartan '50mg'$  and Metoprolol '100mg'$  daily. No CP, dizziness, palpitations, edema, or cough.   Prediabetes Excercising (resistance training nightly), cut back on beer (still drinking 3-4 day, but was drinking far more). Diet interventions, supported by husband who is also making similar changes.    OBJECTIVE:   BP 108/70   Pulse 74   Ht '5\' 5"'$  (1.651 m)   Wt 242 lb (109.8 kg)   SpO2 94%   BMI 40.27 kg/m   Gen: Alert and oriented, NAD Knees: Bilateral knees without joint effusion, no tenderness to palpation along joint line, passive and active ranges of motion intact. Skin: Warm and dry, without rash or excoriation   ASSESSMENT/PLAN:   Arthritis of knee After informed consent obtained, bilateral knees were prepped with alcohol and iodine, each knee was injected with 40 mg of Depo-Medrol and 4 mL of lidocaine instilled laterally to the patellar tendon.  Patient tolerated the procedure well.  Prediabetes Hemoglobin A1c 6.3% today.  Encouraged patient to continue making aggressive lifestyle interventions.  Congratulated her especially on the progress that she has made cutting back on her alcohol consumption.  Nightly resistance training will be key to maintaining her glycemic control.  Patient to return for health maintenance visit, can consider adding a GLP-1 to her regimen at that time to help with glycemic control as well as weight management, which may also ultimately help with her knee pain.  HTN (hypertension) Well-controlled today.  Continue losartan and metoprolol daily. Will obtain  UPCr and BMP at next visit. Last BMP 9/22.      Pearla Dubonnet, MD [place]

## 2021-11-02 NOTE — Assessment & Plan Note (Signed)
Hemoglobin A1c 6.3% today.  Encouraged patient to continue making aggressive lifestyle interventions.  Congratulated her especially on the progress that she has made cutting back on her alcohol consumption.  Nightly resistance training will be key to maintaining her glycemic control.  Patient to return for health maintenance visit, can consider adding a GLP-1 to her regimen at that time to help with glycemic control as well as weight management, which may also ultimately help with her knee pain.

## 2021-11-02 NOTE — Assessment & Plan Note (Signed)
After informed consent obtained, bilateral knees were prepped with alcohol and iodine, each knee was injected with 40 mg of Depo-Medrol and 4 mL of lidocaine instilled laterally to the patellar tendon.  Patient tolerated the procedure well.

## 2021-11-02 NOTE — Assessment & Plan Note (Addendum)
Well-controlled today.  Continue losartan and metoprolol daily. Will obtain UPCr and BMP at next visit. Last BMP 9/22.

## 2022-01-23 ENCOUNTER — Other Ambulatory Visit: Payer: Self-pay

## 2022-01-23 MED ORDER — LOSARTAN POTASSIUM 50 MG PO TABS: ORAL_TABLET | ORAL | 3 refills | Status: DC

## 2022-01-23 MED ORDER — METOPROLOL SUCCINATE ER 100 MG PO TB24: ORAL_TABLET | ORAL | 3 refills | Status: DC

## 2022-02-26 ENCOUNTER — Ambulatory Visit (INDEPENDENT_AMBULATORY_CARE_PROVIDER_SITE_OTHER): Payer: Medicare Other | Admitting: Student

## 2022-02-26 VITALS — BP 118/82 | HR 74 | Ht 65.0 in | Wt 242.4 lb

## 2022-02-26 DIAGNOSIS — M170 Bilateral primary osteoarthritis of knee: Secondary | ICD-10-CM

## 2022-02-26 DIAGNOSIS — M1710 Unilateral primary osteoarthritis, unspecified knee: Secondary | ICD-10-CM

## 2022-02-26 MED ORDER — METHYLPREDNISOLONE ACETATE 40 MG/ML IJ SUSP
40.0000 mg | Freq: Once | INTRAMUSCULAR | Status: AC
  Administered 2022-02-26: 40 mg via INTRAMUSCULAR

## 2022-02-26 NOTE — Assessment & Plan Note (Addendum)
After informed consent obtained, bilateral knees were prepped with alcohol and iodine, each knee was injected with 40 mg of Depo-Medrol and 4 mL of lidocaine instilled medially to the patellar tendon.  Patient tolerated the procedure well. - f/u scheduled for next month, will discuss PT and addition of a GLP-1 at that time

## 2022-02-26 NOTE — Patient Instructions (Addendum)
We injected your knees with some steroid today.  This should give you another good 3 months of relief.  We will continue to do everything that we can to push off a future knee replacement as far as we can.  One of the best things we can do is to consider physical therapy for your knees and also to do anything that we can to work towards weight loss.  The new medications that are out called GLP-1 agonist would be a great option for you.  Take some time to think about this and we will talk about it more at your regular visit in November.  Lisa Dubonnet, MD

## 2022-02-26 NOTE — Progress Notes (Signed)
    SUBJECTIVE:   CHIEF COMPLAINT / HPI:   Bilateral Knee Osteoarthritis Patient here requesting repeat bilateral knee injections. Last injections were in June of this year. Reports ~4 months of substantial pain relief and then worsening of pain starting a few days ago.  She is pleased with the response that she had.  She reports that she understands this is but a temporizing measure to buy her more time before a potential knee replacement down the line.  We discussed other methods for buying her more time prior to a potential knee replacement including physical therapy and weight loss.  She is open to making these sorts of interventions.  We discussed the potential addition of a GLP-1 agonist.  She would like to take some time to think about physical therapy and a GLP-1 and we will discuss this further at her health maintenance visit next month.   OBJECTIVE:   BP 118/82   Pulse 74   Ht '5\' 5"'$  (1.651 m)   Wt 242 lb 6.4 oz (110 kg)   SpO2 96%   BMI 40.34 kg/m   General: alert & oriented, no apparent distress, well groomed HEENT: normocephalic, atraumatic, EOM grossly intact, oral mucosa moist, neck supple Respiratory: normal respiratory effort GI: non-distended Skin: no rashes, no jaundice Psych: appropriate mood and affect Ext: Bilateral knees without effusion, mild tenderness to palpation along joint line lateral>medial bilaterally. FROM actively and passively   ASSESSMENT/PLAN:   Arthritis of knee After informed consent obtained, bilateral knees were prepped with alcohol and iodine, each knee was injected with 40 mg of Depo-Medrol and 4 mL of lidocaine instilled medially to the patellar tendon.  Patient tolerated the procedure well. - f/u scheduled for next month, will discuss PT and addition of a GLP-1 at that time     Pearla Dubonnet, Esmeralda

## 2022-03-21 DIAGNOSIS — Z1211 Encounter for screening for malignant neoplasm of colon: Secondary | ICD-10-CM | POA: Diagnosis not present

## 2022-03-22 ENCOUNTER — Ambulatory Visit (INDEPENDENT_AMBULATORY_CARE_PROVIDER_SITE_OTHER): Payer: Medicare Other | Admitting: Student

## 2022-03-22 ENCOUNTER — Encounter: Payer: Self-pay | Admitting: Student

## 2022-03-22 ENCOUNTER — Ambulatory Visit (INDEPENDENT_AMBULATORY_CARE_PROVIDER_SITE_OTHER): Payer: Medicare Other

## 2022-03-22 VITALS — BP 116/72 | HR 63 | Wt 240.0 lb

## 2022-03-22 DIAGNOSIS — Z23 Encounter for immunization: Secondary | ICD-10-CM

## 2022-03-22 DIAGNOSIS — Z6839 Body mass index (BMI) 39.0-39.9, adult: Secondary | ICD-10-CM | POA: Diagnosis not present

## 2022-03-22 DIAGNOSIS — R7303 Prediabetes: Secondary | ICD-10-CM

## 2022-03-22 DIAGNOSIS — R7309 Other abnormal glucose: Secondary | ICD-10-CM

## 2022-03-22 LAB — POCT GLYCOSYLATED HEMOGLOBIN (HGB A1C): HbA1c, POC (controlled diabetic range): 6.4 % (ref 0.0–7.0)

## 2022-03-22 MED ORDER — SEMAGLUTIDE(0.25 OR 0.5MG/DOS) 2 MG/1.5ML ~~LOC~~ SOPN: 0.2500 mg | PEN_INJECTOR | SUBCUTANEOUS | 0 refills | Status: DC

## 2022-03-22 NOTE — Patient Instructions (Addendum)
Ms. Mabry, I am starting you on a medication called semaglutide.  Brand names are Ozempic and [id number].  This should help with some weight loss and relieve some of the stress on her knees.  I am hoping this will buy some more time before you need a knee replacement in the future.  Things to watch out for are pain in your upper belly that does not go away. If you are nauseous for the first week or so.  For most people this goes away.  If it is intolerable, let me know.  Please call to make a Pap smear appointment. Also call for your mammogram.  We gave you your COVID shot today.  Come back to see me in 4 weeks or so to check in on the semaglutide.

## 2022-03-22 NOTE — Progress Notes (Signed)
    SUBJECTIVE:   Chief compliant/HPI: annual examination  Lisa [name] is a 66 y.o. who presents today for an annual exam.  Of note, she has chronic knee OA that we have been managing with periodic corticosteroid injections. At our last visit we discussed possible addition of a GLP-1 agonist to help promote weight loss to offload the knees. She has done some research and talked to family members and now would like to move forward with a GLP-1.   History tabs reviewed and updated.   Review of systems form reviewed and notable for knee pain.   OBJECTIVE:   BP 116/72   Pulse 63   Wt 240 lb (108.9 kg)   SpO2 98%   BMI 39.94 kg/m   Gen: In good spirits, engaged, NAD HENT: Oropharynx clear, MMM Cardio: Regular rate and rhythm, lungs clear throughout Abd: Soft, non-tender, non-distended Ext: BL knees with joint line tenderness R>L Neuro: Without focal deficits, gait is at baseline Psych: Mood and affect are normal  ASSESSMENT/PLAN:   Adult body mass index 39.0-39.9 Given obesity and prediabetes in addition to knee OA, would benefit from addition of a GLP-1 agonist. - Semaglutide 0.'25mg'$  weekly - Counseling/teaching offered by pharmacy team - F/u in 4 weeks to ensure tolerating new medication    Annual Examination  See AVS for age appropriate recommendations  PHQ score 0, reviewed and discussed.  BP reviewed and at goal Yes.   Considered the following items based upon USPSTF recommendations: Diabetes screening:  known prediabetic, A1c 6.4% today Screening for elevated cholesterol: discussed  DEXA done  Reviewed risk factors for latent tuberculosis and not indicated  Cervical cancer screening:  due for Pap, patient to call and schedule Breast cancer screening:  overdue, patient to call breast center to schedule Colorectal cancer screening:  patient reports she returned Cologuard in the mail yesterday. Vaccinations: up to date. Had PNA and flu vaccines at comm  Desires COVID vaccine today.  Follow up in 4 weeks or sooner if indicated.    Pearla Dubonnet, MD [place]

## 2022-03-22 NOTE — Assessment & Plan Note (Signed)
Given obesity and prediabetes in addition to knee OA, would benefit from addition of a GLP-1 agonist. - Semaglutide 0.'25mg'$  weekly - Counseling/teaching offered by pharmacy team - F/u in 4 weeks to ensure tolerating new medication

## 2022-04-02 LAB — COLOGUARD: COLOGUARD: POSITIVE — AB

## 2022-04-22 ENCOUNTER — Other Ambulatory Visit: Payer: Self-pay | Admitting: Student

## 2022-04-25 ENCOUNTER — Other Ambulatory Visit: Payer: Self-pay

## 2022-04-25 MED ORDER — MONTELUKAST SODIUM 10 MG PO TABS: ORAL_TABLET | ORAL | 3 refills | Status: DC

## 2022-05-03 ENCOUNTER — Telehealth: Payer: Self-pay

## 2022-05-03 NOTE — Telephone Encounter (Signed)
Patient calls nurse line requesting positive Cologuard test be faxed to [facility].  She reports they needs positive test before she can be seen.   Cologuard result faxed.

## 2022-05-08 ENCOUNTER — Other Ambulatory Visit: Payer: Self-pay | Admitting: Student

## 2022-05-08 DIAGNOSIS — R7303 Prediabetes: Secondary | ICD-10-CM

## 2022-05-08 DIAGNOSIS — Z6839 Body mass index (BMI) 39.0-39.9, adult: Secondary | ICD-10-CM

## 2022-06-11 ENCOUNTER — Encounter: Payer: Self-pay | Admitting: Student

## 2022-06-11 DIAGNOSIS — Z0000 Encounter for general adult medical examination without abnormal findings: Secondary | ICD-10-CM

## 2022-06-14 ENCOUNTER — Other Ambulatory Visit: Payer: Self-pay | Admitting: Student

## 2022-06-14 DIAGNOSIS — Z1231 Encounter for screening mammogram for malignant neoplasm of breast: Secondary | ICD-10-CM

## 2022-06-24 ENCOUNTER — Telehealth: Payer: Self-pay

## 2022-06-24 NOTE — Telephone Encounter (Signed)
Patient calls nurse line in regards to [place].   She reports she has been taking 0.'25mg'$  for over one month and reports she is ready to increase to .'5mg'$ .  Patient reports she has no undesired side effects. No nausea or upset stomach. She reports she has already spoken to PCP about this.   Patient reports she has one one refill left. Patient advised to use refill for .'5mg'$  and to make an apt at the end of next month for titration.  Will forward to PCP.

## 2022-07-01 DIAGNOSIS — R195 Other fecal abnormalities: Secondary | ICD-10-CM | POA: Diagnosis not present

## 2022-07-01 DIAGNOSIS — D128 Benign neoplasm of rectum: Secondary | ICD-10-CM | POA: Diagnosis not present

## 2022-07-01 DIAGNOSIS — K648 Other hemorrhoids: Secondary | ICD-10-CM | POA: Diagnosis not present

## 2022-07-01 DIAGNOSIS — K5730 Diverticulosis of large intestine without perforation or abscess without bleeding: Secondary | ICD-10-CM | POA: Diagnosis not present

## 2022-07-01 DIAGNOSIS — D125 Benign neoplasm of sigmoid colon: Secondary | ICD-10-CM | POA: Diagnosis not present

## 2022-07-01 LAB — HM COLONOSCOPY

## 2022-07-03 DIAGNOSIS — M25561 Pain in right knee: Secondary | ICD-10-CM | POA: Diagnosis not present

## 2022-07-03 DIAGNOSIS — D125 Benign neoplasm of sigmoid colon: Secondary | ICD-10-CM | POA: Diagnosis not present

## 2022-07-03 DIAGNOSIS — M25562 Pain in left knee: Secondary | ICD-10-CM | POA: Diagnosis not present

## 2022-07-03 DIAGNOSIS — D128 Benign neoplasm of rectum: Secondary | ICD-10-CM | POA: Diagnosis not present

## 2022-07-03 DIAGNOSIS — R262 Difficulty in walking, not elsewhere classified: Secondary | ICD-10-CM | POA: Diagnosis not present

## 2022-07-03 DIAGNOSIS — M170 Bilateral primary osteoarthritis of knee: Secondary | ICD-10-CM | POA: Diagnosis not present

## 2022-07-05 ENCOUNTER — Telehealth: Payer: Self-pay

## 2022-07-05 NOTE — Telephone Encounter (Signed)
A Prior Authorization was initiated & DENIED for this patients OZEMPIC through CoverMyMeds.   REASON: Drugs when used for anorexia, weight loss, or weight gain are excluded from coverage under Medicare rules.  Key: [id number]

## 2022-07-10 DIAGNOSIS — R262 Difficulty in walking, not elsewhere classified: Secondary | ICD-10-CM | POA: Diagnosis not present

## 2022-07-10 DIAGNOSIS — M25561 Pain in right knee: Secondary | ICD-10-CM | POA: Diagnosis not present

## 2022-07-10 DIAGNOSIS — M25562 Pain in left knee: Secondary | ICD-10-CM | POA: Diagnosis not present

## 2022-07-10 DIAGNOSIS — M170 Bilateral primary osteoarthritis of knee: Secondary | ICD-10-CM | POA: Diagnosis not present

## 2022-07-17 DIAGNOSIS — M25561 Pain in right knee: Secondary | ICD-10-CM | POA: Diagnosis not present

## 2022-07-17 DIAGNOSIS — R262 Difficulty in walking, not elsewhere classified: Secondary | ICD-10-CM | POA: Diagnosis not present

## 2022-07-17 DIAGNOSIS — M25562 Pain in left knee: Secondary | ICD-10-CM | POA: Diagnosis not present

## 2022-07-17 DIAGNOSIS — M170 Bilateral primary osteoarthritis of knee: Secondary | ICD-10-CM | POA: Diagnosis not present

## 2022-08-12 ENCOUNTER — Ambulatory Visit
Admission: RE | Admit: 2022-08-12 | Discharge: 2022-08-12 | Disposition: A | Discharge status: Home / Self Care | Payer: 59 | Source: Ambulatory Visit | Attending: *Deleted | Admitting: *Deleted

## 2022-08-12 DIAGNOSIS — Z1231 Encounter for screening mammogram for malignant neoplasm of breast: Secondary | ICD-10-CM

## 2022-08-13 ENCOUNTER — Ambulatory Visit (INDEPENDENT_AMBULATORY_CARE_PROVIDER_SITE_OTHER): Payer: 59 | Admitting: Student

## 2022-08-13 ENCOUNTER — Encounter: Payer: Self-pay | Admitting: Student

## 2022-08-13 VITALS — BP 126/74 | HR 72 | Ht 65.0 in | Wt 230.0 lb

## 2022-08-13 DIAGNOSIS — Z0000 Encounter for general adult medical examination without abnormal findings: Secondary | ICD-10-CM

## 2022-08-13 DIAGNOSIS — J4540 Moderate persistent asthma, uncomplicated: Secondary | ICD-10-CM | POA: Diagnosis not present

## 2022-08-13 DIAGNOSIS — E039 Hypothyroidism, unspecified: Secondary | ICD-10-CM | POA: Diagnosis not present

## 2022-08-13 DIAGNOSIS — K219 Gastro-esophageal reflux disease without esophagitis: Secondary | ICD-10-CM

## 2022-08-13 DIAGNOSIS — M810 Age-related osteoporosis without current pathological fracture: Secondary | ICD-10-CM

## 2022-08-13 DIAGNOSIS — Z6839 Body mass index (BMI) 39.0-39.9, adult: Secondary | ICD-10-CM

## 2022-08-13 MED ORDER — OYSTER SHELL CALCIUM/D3 500-5 MG-MCG PO TABS: 2.0000 | ORAL_TABLET | Freq: Every day | ORAL | 3 refills | Status: AC

## 2022-08-13 MED ORDER — FAMOTIDINE 20 MG PO TABS: 20.0000 mg | ORAL_TABLET | Freq: Two times a day (BID) | ORAL | 3 refills | Status: DC

## 2022-08-13 MED ORDER — FLUTICASONE-SALMETEROL 100-50 MCG/ACT IN AEPB: INHALATION_SPRAY | RESPIRATORY_TRACT | 0 refills | Status: DC

## 2022-08-13 MED ORDER — OYSTER SHELL CALCIUM/D3 500-5 MG-MCG PO TABS: 1.0000 | ORAL_TABLET | Freq: Every day | ORAL | 3 refills | Status: DC

## 2022-08-13 NOTE — Patient Instructions (Addendum)
It's always great to see you both!  Glad you're doing well!   I do recommend the shingles vaccine, you can have this done at any community pharmacy.   I will follow up on your mammogram results. We will start injections for your osteoporosis that will be given here every six months.   Stop the Nexium. Start Pepcid. 20mg  / day

## 2022-08-13 NOTE — Assessment & Plan Note (Signed)
Weight is down 10 lbs after cessation of alcohol. Congratulated on success here!

## 2022-08-13 NOTE — Assessment & Plan Note (Signed)
Unfortunately had difficulty with taking alendronate and self-discontinued. Certainly would benefit from medical therapy. Discussed Prolia. She is open to this. Is taking a MIV but not dedicated dosing of Calcium-Vit D. - Will obtain pre-Prolia labs today: CMP, Mag, Phos - Recommend 1000mg  calcium and 400 IU Vit D daily - If labwork permissive, will schedule with RN clinic for Prolia

## 2022-08-13 NOTE — Progress Notes (Signed)
    SUBJECTIVE:   Chief compliant/HPI: annual examination  Lisa [name] is a 67 y.o. who presents today for an annual exam.   History tabs reviewed and updated.   Review of systems form reviewed and notable for none, feeling well. Pleased to share she has lost weight since she quit drinking etOH.   OBJECTIVE:   BP 126/74   Pulse 72   Ht 5\' 5"  (1.651 m)   Wt 230 lb (104.3 kg)   SpO2 98%   BMI 38.27 kg/m   Gen: In good spirits HENT: EOMs intact, sclerae anicteric Cardio: RRR, no m/r/g Pulm: Normal WOB on RA, lungs clear throughout Abd: Non-tender, non-distended Neuro: Gait normal   ASSESSMENT/PLAN:   Osteoporosis of vertebra Unfortunately had difficulty with taking alendronate and self-discontinued. Certainly would benefit from medical therapy. Discussed Prolia. She is open to this. Is taking a MIV but not dedicated dosing of Calcium-Vit D. - Will obtain pre-Prolia labs today: CMP, Mag, Phos - Recommend 1000mg  calcium and 400 IU Vit D daily - If labwork permissive, will schedule with RN clinic for Prolia  Adult body mass index 39.0-39.9 Weight is down 10 lbs after cessation of alcohol. Congratulated on success here!   Hypothyroidism Good adherence to Synthroid 99mcg daily. - Will recheck at [facility] today    Annual Examination  See AVS for age appropriate recommendations  PHQ score 0, reviewed and discussed.  BP reviewed and at goal yes.   Considered the following items based upon USPSTF recommendations: Diabetes screening: previously done  Screening for elevated cholesterol: previously collected  Reviewed risk factors for latent tuberculosis and not indicated   Cervical cancer screening:  aged out Breast cancer screening:  Done yesterday, awaiting radiologist read  Colorectal cancer screening: up to date on screening for CRC. Lung cancer screening:  not indicated, quit smoking >30 years ago . Vaccinations: needs shingles, plans to obtain in the community .    Follow up in 1 year or sooner if indicated.    Marnee Guarneri, MD [place]

## 2022-08-13 NOTE — Assessment & Plan Note (Signed)
Good adherence to Synthroid 62mcg daily. - Will recheck at [facility] today

## 2022-08-14 LAB — COMPREHENSIVE METABOLIC PANEL
ALT: 17 IU/L (ref 0–32)
AST: 21 IU/L (ref 0–40)
Albumin/Globulin Ratio: 1.9 (ref 1.2–2.2)
Albumin: 4.1 g/dL (ref 3.9–4.9)
Alkaline Phosphatase: 86 IU/L (ref 44–121)
BUN/Creatinine Ratio: 14 (ref 12–28)
BUN: 11 mg/dL (ref 8–27)
Bilirubin Total: 0.3 mg/dL (ref 0.0–1.2)
CO2: 24 mmol/L (ref 20–29)
Calcium: 9.8 mg/dL (ref 8.7–10.3)
Chloride: 100 mmol/L (ref 96–106)
Creatinine, Ser: 0.78 mg/dL (ref 0.57–1.00)
Globulin, Total: 2.2 g/dL (ref 1.5–4.5)
Glucose: 117 mg/dL — ABNORMAL HIGH (ref 70–99)
Potassium: 3.6 mmol/L (ref 3.5–5.2)
Sodium: 141 mmol/L (ref 134–144)
Total Protein: 6.3 g/dL (ref 6.0–8.5)
eGFR: 84 mL/min/{1.73_m2} (ref >59)

## 2022-08-14 LAB — PHOSPHORUS: Phosphorus: 4 mg/dL (ref 3.0–4.3)

## 2022-08-14 LAB — MAGNESIUM: Magnesium: 1.6 mg/dL (ref 1.6–2.3)

## 2022-08-14 LAB — TSH: TSH: 5.49 u[IU]/mL — ABNORMAL HIGH (ref 0.450–4.500)

## 2022-08-15 MED ORDER — DENOSUMAB 60 MG/ML ~~LOC~~ SOSY: 60.0000 mg | PREFILLED_SYRINGE | Freq: Once | SUBCUTANEOUS | 0 refills | Status: DC

## 2022-08-15 NOTE — Addendum Note (Signed)
Addended by: Jim Like B on: 08/15/2022 01:06 PM   Modules accepted: Orders

## 2022-08-20 ENCOUNTER — Telehealth: Payer: Self-pay

## 2022-08-20 NOTE — Telephone Encounter (Signed)
Patient calls nurse line requesting to schedule Prolia Injection.   She reports she picked the medication up at the pharmacy already.   She is requesting an early morning apt on Friday. Apt made for 9:30am.

## 2022-08-23 ENCOUNTER — Ambulatory Visit (INDEPENDENT_AMBULATORY_CARE_PROVIDER_SITE_OTHER): Payer: 59

## 2022-08-23 DIAGNOSIS — Z0000 Encounter for general adult medical examination without abnormal findings: Secondary | ICD-10-CM

## 2022-08-26 ENCOUNTER — Ambulatory Visit (INDEPENDENT_AMBULATORY_CARE_PROVIDER_SITE_OTHER): Payer: 59

## 2022-08-26 DIAGNOSIS — M810 Age-related osteoporosis without current pathological fracture: Secondary | ICD-10-CM | POA: Diagnosis not present

## 2022-08-26 MED ORDER — DENOSUMAB 60 MG/ML ~~LOC~~ SOSY
60.0000 mg | PREFILLED_SYRINGE | Freq: Once | SUBCUTANEOUS | Status: AC
  Administered 2022-08-26: 60 mg via SUBCUTANEOUS

## 2022-08-26 NOTE — Progress Notes (Signed)
Patient presents to nurse clinic for prolia injection. Patient has questions about possible side effects for Prolia.   Provided patient with Prolia handout and scheduled follow up appointment with PCP.   Spoke with Dr. Marisue Humble on Friday afternoon. He was able to call patient and discuss further. Called patient and rescheduled appointment for Monday morning.   Veronda Prude, RN

## 2022-08-26 NOTE — Progress Notes (Signed)
Patients presents to nurse clinic for initial Prolia injection.   Patient brings own supply.   Prolia administered SubQ in left arm.   All questions answered.

## 2022-08-28 ENCOUNTER — Other Ambulatory Visit: Payer: Self-pay | Admitting: Student

## 2022-08-28 DIAGNOSIS — J4540 Moderate persistent asthma, uncomplicated: Secondary | ICD-10-CM

## 2022-09-10 ENCOUNTER — Ambulatory Visit: Payer: 59 | Admitting: Student

## 2022-10-03 ENCOUNTER — Telehealth: Payer: Self-pay | Admitting: Student

## 2022-10-03 NOTE — Progress Notes (Signed)
I was preceptor the day of this visit.   

## 2022-10-03 NOTE — Telephone Encounter (Signed)
Contacted Lisa [name] to schedule their annual wellness visit. Appointment made for 10/07/2022.  Thank you,  [facility] Support [facility] Direct dial  [phone]

## 2022-10-06 NOTE — Progress Notes (Cosign Needed Addendum)
Subjective:   Lisa [name] is a 67 y.o. female who presents for Medicare Annual (Subsequent) preventive examination.  I connected with  [name] on 10/07/22 by a audio enabled telemedicine application and verified that I am speaking with the correct person using two identifiers.  Patient Location: Home  Provider Location: Home Office  I discussed the limitations of evaluation and management by telemedicine. The patient expressed understanding and agreed to proceed.  Review of Systems     Cardiac Risk Factors include: advanced age (>47men, >60 women);dyslipidemia;hypertension     Objective:    Today's Vitals   10/07/22 1154  Weight: 230 lb (104.3 kg)  Height: 5\' 5"  (1.651 m)   Body mass index is 38.27 kg/m.     10/07/2022   12:07 PM 08/13/2022    1:09 PM 03/22/2022    8:33 AM 02/26/2022    8:49 AM 11/02/2021    1:36 PM 07/20/2020    1:56 PM 01/31/2020    1:56 PM  Advanced Directives  Does Patient Have a Medical Advance Directive? No No No No No No No  Would patient like information on creating a medical advance directive? Yes (MAU/Ambulatory/Procedural Areas - Information given) No - Patient declined No - Patient declined No - Patient declined No - Patient declined Yes (MAU/Ambulatory/Procedural Areas - Information given) No - Patient declined    Current Medications (verified) Outpatient Encounter Medications as of 10/07/2022  Medication Sig   albuterol (PROVENTIL) (2.5 MG/3ML) 0.083% nebulizer solution Take 3 mLs (2.5 mg total) by nebulization every 6 (six) hours as needed for wheezing or shortness of breath.   albuterol (VENTOLIN HFA) 108 (90 Base) MCG/ACT inhaler INHALE 2 PUFFS BY MOUTH EVERY 6 HOURS IF NEEDED FOR WHEEZING   aspirin 81 MG EC tablet Take 81 mg by mouth daily.   calcium-vitamin D (OSCAL WITH D) 500-5 MG-MCG tablet Take 2 tablets by mouth daily with breakfast.   cetirizine (ZYRTEC) 10 MG tablet TAKE 1 TABLET BY MOUTH ONCE DAILY (Patient  taking differently: Take 10 mg by mouth daily.)   famotidine (PEPCID) 20 MG tablet Take 1 tablet (20 mg total) by mouth 2 (two) times daily.   fluticasone-salmeterol (ADVAIR) 100-50 MCG/ACT AEPB INHALE 1 PUFF INTO THE LUNGS TWICE DAILY   levothyroxine (SYNTHROID) 25 MCG tablet TAKE 1 TABLET(25 MCG) BY MOUTH DAILY   losartan (COZAAR) 50 MG tablet TAKE 1 TABLET(50 MG) BY MOUTH DAILY   metoprolol succinate (TOPROL-XL) 100 MG 24 hr tablet TAKE 1 TABLET(100 MG) BY MOUTH DAILY WITH OR IMMEDIATELY FOLLOWING A MEAL   montelukast (SINGULAIR) 10 MG tablet TAKE 1 TABLET(10 MG) BY MOUTH DAILY   No facility-administered encounter medications on file as of 10/07/2022.    Allergies (verified) Ace inhibitors and Penicillins   History: Past Medical History:  Diagnosis Date   Alcoholism (HCC)    Chronic -- not interested in cutting back.  Has cut back from 12 beers daily in 2014.     Allergy    Asthma    GERD (gastroesophageal reflux disease)    Hypertension    Hypothyroidism    Past Surgical History:  Procedure Laterality Date   APPENDECTOMY     CESAREAN SECTION     COLONOSCOPY  2010   [facility] - Repeat in 10 years as normal findings    TONSILLECTOMY AND ADENOIDECTOMY     TUBAL LIGATION     Family History  Problem Relation Age of Onset   COPD Mother  Heart disease Father    COPD Father    Lung cancer Father    COPD Brother    Social History   Socioeconomic History   Marital status: Married    Spouse name: Casimiro Needle    Number of children: 2   Years of education: 12   Highest education level: Not on file  Occupational History   Not on file  Tobacco Use   Smoking status: Former    Passive exposure: Past   Smokeless tobacco: Former    Quit date: 03/07/1987  Substance and Sexual Activity   Alcohol use: Yes    Alcohol/week: 3.0 standard drinks of alcohol    Types: 3 Cans of beer per week   Drug use: Never   Sexual activity: Yes    Birth control/protection: Post-menopausal,  Surgical  Other Topics Concern   Not on file  Social History Narrative   Patient lives with her husband Casimiro Needle.    Patient has 2 adult children, with 6 grandchildren, 1 great grandchild.   Patients family lives all on the same street.    Patient works at [employer] a couple days a week.   Patient enjoys spending time with family and listening to audio books.    Social Determinants of Health   Financial Resource Strain: Low Risk  (10/07/2022)   Overall Financial Resource Strain (CARDIA)    Difficulty of Paying Living Expenses: Not hard at all  Food Insecurity: No Food Insecurity (10/07/2022)   Hunger Vital Sign    Worried About Running Out of Food in the Last Year: Never true    Ran Out of Food in the Last Year: Never true  Transportation Needs: No Transportation Needs (10/07/2022)   PRAPARE - Administrator, Civil Service (Medical): No    Lack of Transportation (Non-Medical): No  Physical Activity: Sufficiently Active (10/07/2022)   Exercise Vital Sign    Days of Exercise per Week: 5 days    Minutes of Exercise per Session: 30 min  Stress: No Stress Concern Present (10/07/2022)   [employer] of Occupational Health - Occupational Stress Questionnaire    Feeling of Stress : Only a little  Social Connections: Moderately Integrated (10/07/2022)   Social Connection and Isolation Panel [NHANES]    Frequency of Communication with Friends and Family: More than three times a week    Frequency of Social Gatherings with Friends and Family: Three times a week    Attends Religious Services: 1 to 4 times per year    Active Member of Clubs or Organizations: No    Attends Banker Meetings: Never    Marital Status: Married    Tobacco Counseling Counseling given: Not Answered   Clinical Intake:  Pre-visit preparation completed: Yes  Pain : No/denies pain  Diabetes: No  How often do you need to have someone help you when you read instructions, pamphlets, or  other written materials from your doctor or pharmacy?: 1 - Never  Diabetic?no   Interpreter Needed?: No  Information entered by :: Kandis Fantasia LPN   Activities of Daily Living    10/07/2022   11:56 AM  In your present state of health, do you have any difficulty performing the following activities:  Hearing? 0  Vision? 0  Difficulty concentrating or making decisions? 0  Walking or climbing stairs? 0  Dressing or bathing? 0  Doing errands, shopping? 0  Preparing Food and eating ? N  Using the Toilet? N  In the past  six months, have you accidently leaked urine? N  Do you have problems with loss of bowel control? N  Managing your Medications? N  Managing your Finances? N  Housekeeping or managing your Housekeeping? N    Patient Care Team: Alicia Amel, MD as PCP - General (Family Medicine)  Indicate any recent Medical Services you may have received from other than [facility] providers in the past year (date may be approximate).     Assessment:   This is a routine wellness examination for [place].  Hearing/Vision screen Hearing Screening - Comments:: Denies hearing difficulties   Vision Screening - Comments:: up to date with routine eye exams with Dr. Nile Riggs    Dietary issues and exercise activities discussed: Current Exercise Habits: Home exercise routine, Time (Minutes): 30, Frequency (Times/Week): 5, Weekly Exercise (Minutes/Week): 150, Intensity: Mild   Goals Addressed             This Visit's Progress    Remain active and independent         Depression Screen    10/07/2022   12:07 PM 08/13/2022    1:12 PM 03/22/2022    8:33 AM 02/26/2022    8:50 AM 11/02/2021    4:21 PM 11/02/2021    1:35 PM 11/27/2020    3:15 PM  PHQ 2/9 Scores  PHQ - 2 Score 0 0 0 0 0 0 0  PHQ- 9 Score  0 0 0 0 0     Fall Risk    10/07/2022   11:56 AM 02/26/2022    8:50 AM 11/02/2021    1:35 PM 07/20/2020    1:56 PM 11/16/2019    1:47 PM  Fall Risk   Falls in the past year? 1 1  0 0 0  Number falls in past yr: 1 0 0  0  Injury with Fall? 1  0  0  Risk for fall due to : History of fall(s);Impaired balance/gait;Impaired mobility      Follow up Education provided;Falls prevention discussed;Falls evaluation completed        FALL RISK PREVENTION PERTAINING TO THE HOME:  Any stairs in or around the home? No  If so, are there any without handrails? No  Home free of loose throw rugs in walkways, pet beds, electrical cords, etc? Yes  Adequate lighting in your home to reduce risk of falls? Yes   ASSISTIVE DEVICES UTILIZED TO PREVENT FALLS:  Life alert? No  Use of a cane, walker or w/c? No  Grab bars in the bathroom? Yes  Shower chair or bench in shower? No  Elevated toilet seat or a handicapped toilet? Yes   TIMED UP AND GO:  Was the test performed? No . Telephonic visit   Cognitive Function:        10/07/2022   12:07 PM 07/20/2020    1:57 PM  6CIT Screen  What Year? 0 points 0 points  What month? 0 points 0 points  What time? 0 points 0 points  Count back from 20 0 points 0 points  Months in reverse 0 points 0 points  Repeat phrase 0 points 0 points  Total Score 0 points 0 points    Immunizations Immunization History  Administered Date(s) Administered   COVID-19, mRNA, vaccine(Comirnaty)12 years and older 03/22/2022   Influenza Split 02/05/2011, 02/27/2012   Influenza,inj,Quad PF,6+ Mos 03/02/2013, 02/07/2015, 01/23/2016, 01/24/2017, 01/27/2018, 01/20/2019, 01/31/2020, 02/13/2021   Influenza-Unspecified 03/03/2014, 02/11/2022   PFIZER(Purple Top)SARS-COV-2 Vaccination 07/31/2019, 08/24/2019   Td 10/19/2003  Tdap 02/07/2015    TDAP status: Up to date  Pneumococcal vaccine status: Due, Education has been provided regarding the importance of this vaccine. Advised may receive this vaccine at local pharmacy or Health Dept. Aware to provide a copy of the vaccination record if obtained from local pharmacy or Health Dept. Verbalized acceptance and  understanding.  Covid-19 vaccine status: Information provided on how to obtain vaccines.   Qualifies for Shingles Vaccine? Yes   Zostavax completed No   Shingrix Completed?: No.    Education has been provided regarding the importance of this vaccine. Patient has been advised to call insurance company to determine out of pocket expense if they have not yet received this vaccine. Advised may also receive vaccine at local pharmacy or Health Dept. Verbalized acceptance and understanding.  Screening Tests Health Maintenance  Topic Date Due   Zoster Vaccines- Shingrix (1 of 2) Never done   Pneumonia Vaccine 45+ Years old (1 of 1 - PCV) Never done   INFLUENZA VACCINE  12/19/2022   Medicare Annual Wellness (AWV)  10/07/2023   MAMMOGRAM  08/11/2024   DTaP/Tdap/Td (3 - Td or Tdap) 02/06/2025   COLONOSCOPY (Pts 45-36yrs Insurance coverage will need to be confirmed)  07/01/2032   DEXA SCAN  Completed   COVID-19 Vaccine  Completed   Hepatitis C Screening  Completed   HPV VACCINES  Aged Out    Health Maintenance  Health Maintenance Due  Topic Date Due   Zoster Vaccines- Shingrix (1 of 2) Never done   Pneumonia Vaccine 32+ Years old (1 of 1 - PCV) Never done    Colorectal cancer screening: Type of screening: Colonoscopy. Completed 07/01/22. Repeat every 10 years  Mammogram status: Completed 08/12/22. Repeat every year  Bone Density status: Completed 04/07/20. Results reflect: Bone density results: OSTEOPENIA. Repeat every 2 years.  Lung Cancer Screening: (Low Dose CT Chest recommended if Age 72-80 years, 30 pack-year currently smoking OR have quit w/in 15years.) does not qualify.   Lung Cancer Screening Referral: n/a  Additional Screening:  Hepatitis C Screening: does qualify; Completed 07/09/16  Vision Screening: Recommended annual ophthalmology exams for early detection of glaucoma and other disorders of the eye. Is the patient up to date with their annual eye exam?  Yes  Who is the  provider or what is the name of the office in which the patient attends annual eye exams? [facility]  If pt is not established with a provider, would they like to be referred to a provider to establish care? No .   Dental Screening: Recommended annual dental exams for proper oral hygiene  Community Resource Referral / Chronic Care Management: CRR required this visit?  No   CCM required this visit?  No      Plan:     I have personally reviewed and noted the following in the patient's chart:   Medical and social history Use of alcohol, tobacco or illicit drugs  Current medications and supplements including opioid prescriptions. Patient is not currently taking opioid prescriptions. Functional ability and status Nutritional status Physical activity Advanced directives List of other physicians Hospitalizations, surgeries, and ER visits in previous 12 months Vitals Screenings to include cognitive, depression, and falls Referrals and appointments  In addition, I have reviewed and discussed with patient certain preventive protocols, quality metrics, and best practice recommendations. A written personalized care plan for preventive services as well as general preventive health recommendations were provided to patient.     Durwin Nora, LPN  10/07/2022   Due to this being a virtual visit, the after visit summary with patients personalized plan was offered to patient via mail or my-chart.  per request, patient was mailed a copy of AVS  Nurse Notes: No concerns

## 2022-10-06 NOTE — Patient Instructions (Signed)
Lisa [name] , Thank you for taking time to come for your Medicare Wellness Visit. I appreciate your ongoing commitment to your health goals. Please review the following plan we discussed and let me know if I can assist you in the future.   These are the goals we discussed:  Goals      Weight (lb) < 200 lb (90.7 kg)     Counseled on portion control. Walking with the nicer weather outside.        This is a list of the screening recommended for you and due dates:  Health Maintenance  Topic Date Due   Zoster (Shingles) Vaccine (1 of 2) Never done   Pneumonia Vaccine (1 of 1 - PCV) Never done   Medicare Annual Wellness Visit  07/20/2021   Flu Shot  12/19/2022   Mammogram  08/11/2024   DTaP/Tdap/Td vaccine (3 - Td or Tdap) 02/06/2025   Colon Cancer Screening  07/01/2032   DEXA scan (bone density measurement)  Completed   COVID-19 Vaccine  Completed   Hepatitis C Screening: USPSTF Recommendation to screen - Ages 41-79 yo.  Completed   HPV Vaccine  Aged Out    Advanced directives: ***  Conditions/risks identified: Aim for 30 minutes of exercise or brisk walking, 6-8 glasses of water, and 5 servings of fruits and vegetables each day.   Next appointment: Follow up in one year for your annual wellness visit    Preventive Care 65 Years and Older, Female Preventive care refers to lifestyle choices and visits with your health care provider that can promote health and wellness. What does preventive care include? A yearly physical exam. This is also called an annual well check. Dental exams once or twice a year. Routine eye exams. Ask your health care provider how often you should have your eyes checked. Personal lifestyle choices, including: Daily care of your teeth and gums. Regular physical activity. Eating a healthy diet. Avoiding tobacco and drug use. Limiting alcohol use. Practicing safe sex. Taking low-dose aspirin every day. Taking vitamin and mineral supplements as  recommended by your health care provider. What happens during an annual well check? The services and screenings done by your health care provider during your annual well check will depend on your age, overall health, lifestyle risk factors, and family history of disease. Counseling  Your health care provider may ask you questions about your: Alcohol use. Tobacco use. Drug use. Emotional well-being. Home and relationship well-being. Sexual activity. Eating habits. History of falls. Memory and ability to understand (cognition). Work and work Astronomer. Reproductive health. Screening  You may have the following tests or measurements: Height, weight, and BMI. Blood pressure. Lipid and cholesterol levels. These may be checked every 5 years, or more frequently if you are over 88 years old. Skin check. Lung cancer screening. You may have this screening every year starting at age 32 if you have a 30-pack-year history of smoking and currently smoke or have quit within the past 15 years. Fecal occult blood test (FOBT) of the stool. You may have this test every year starting at age 36. Flexible sigmoidoscopy or colonoscopy. You may have a sigmoidoscopy every 5 years or a colonoscopy every 10 years starting at age 37. Hepatitis C blood test. Hepatitis B blood test. Sexually transmitted disease (STD) testing. Diabetes screening. This is done by checking your blood sugar (glucose) after you have not eaten for a while (fasting). You may have this done every 1-3 years. Bone density scan. This is  done to screen for osteoporosis. You may have this done starting at age 25. Mammogram. This may be done every 1-2 years. Talk to your health care provider about how often you should have regular mammograms. Talk with your health care provider about your test results, treatment options, and if necessary, the need for more tests. Vaccines  Your health care provider may recommend certain vaccines, such  as: Influenza vaccine. This is recommended every year. Tetanus, diphtheria, and acellular pertussis (Tdap, Td) vaccine. You may need a Td booster every 10 years. Zoster vaccine. You may need this after age 59. Pneumococcal 13-valent conjugate (PCV13) vaccine. One dose is recommended after age 5. Pneumococcal polysaccharide (PPSV23) vaccine. One dose is recommended after age 40. Talk to your health care provider about which screenings and vaccines you need and how often you need them. This information is not intended to replace advice given to you by your health care provider. Make sure you discuss any questions you have with your health care provider. Document Released: 06/02/2015 Document Revised: 01/24/2016 Document Reviewed: 03/07/2015 Elsevier Interactive Patient Education  2017 [employer].  Fall Prevention in the Home Falls can cause injuries. They can happen to people of all ages. There are many things you can do to make your home safe and to help prevent falls. What can I do on the outside of my home? Regularly fix the edges of walkways and driveways and fix any cracks. Remove anything that might make you trip as you walk through a door, such as a raised step or threshold. Trim any bushes or trees on the path to your home. Use bright outdoor lighting. Clear any walking paths of anything that might make someone trip, such as rocks or tools. Regularly check to see if handrails are loose or broken. Make sure that both sides of any steps have handrails. Any raised decks and porches should have guardrails on the edges. Have any leaves, snow, or ice cleared regularly. Use sand or salt on walking paths during winter. Clean up any spills in your garage right away. This includes oil or grease spills. What can I do in the bathroom? Use night lights. Install grab bars by the toilet and in the tub and shower. Do not use towel bars as grab bars. Use non-skid mats or decals in the tub or  shower. If you need to sit down in the shower, use a plastic, non-slip stool. Keep the floor dry. Clean up any water that spills on the floor as soon as it happens. Remove soap buildup in the tub or shower regularly. Attach bath mats securely with double-sided non-slip rug tape. Do not have throw rugs and other things on the floor that can make you trip. What can I do in the bedroom? Use night lights. Make sure that you have a light by your bed that is easy to reach. Do not use any sheets or blankets that are too big for your bed. They should not hang down onto the floor. Have a firm chair that has side arms. You can use this for support while you get dressed. Do not have throw rugs and other things on the floor that can make you trip. What can I do in the kitchen? Clean up any spills right away. Avoid walking on wet floors. Keep items that you use a lot in easy-to-reach places. If you need to reach something above you, use a strong step stool that has a grab bar. Keep electrical cords out of  the way. Do not use floor polish or wax that makes floors slippery. If you must use wax, use non-skid floor wax. Do not have throw rugs and other things on the floor that can make you trip. What can I do with my stairs? Do not leave any items on the stairs. Make sure that there are handrails on both sides of the stairs and use them. Fix handrails that are broken or loose. Make sure that handrails are as long as the stairways. Check any carpeting to make sure that it is firmly attached to the stairs. Fix any carpet that is loose or worn. Avoid having throw rugs at the top or bottom of the stairs. If you do have throw rugs, attach them to the floor with carpet tape. Make sure that you have a light switch at the top of the stairs and the bottom of the stairs. If you do not have them, ask someone to add them for you. What else can I do to help prevent falls? Wear shoes that: Do not have high heels. Have  rubber bottoms. Are comfortable and fit you well. Are closed at the toe. Do not wear sandals. If you use a stepladder: Make sure that it is fully opened. Do not climb a closed stepladder. Make sure that both sides of the stepladder are locked into place. Ask someone to hold it for you, if possible. Clearly mark and make sure that you can see: Any grab bars or handrails. First and last steps. Where the edge of each step is. Use tools that help you move around (mobility aids) if they are needed. These include: Canes. Walkers. Scooters. Crutches. Turn on the lights when you go into a dark area. Replace any light bulbs as soon as they burn out. Set up your furniture so you have a clear path. Avoid moving your furniture around. If any of your floors are uneven, fix them. If there are any pets around you, be aware of where they are. Review your medicines with your doctor. Some medicines can make you feel dizzy. This can increase your chance of falling. Ask your doctor what other things that you can do to help prevent falls. This information is not intended to replace advice given to you by your health care provider. Make sure you discuss any questions you have with your health care provider. Document Released: 03/02/2009 Document Revised: 10/12/2015 Document Reviewed: 06/10/2014 Elsevier Interactive Patient Education  2017 [employer].

## 2022-10-07 ENCOUNTER — Ambulatory Visit (INDEPENDENT_AMBULATORY_CARE_PROVIDER_SITE_OTHER): Payer: 59

## 2022-10-07 VITALS — Ht 65.0 in | Wt 230.0 lb

## 2022-10-07 DIAGNOSIS — Z0000 Encounter for general adult medical examination without abnormal findings: Secondary | ICD-10-CM | POA: Diagnosis not present

## 2022-10-28 ENCOUNTER — Other Ambulatory Visit: Payer: Self-pay | Admitting: Family Medicine

## 2023-01-28 ENCOUNTER — Other Ambulatory Visit: Payer: Self-pay | Admitting: Student

## 2023-01-28 DIAGNOSIS — K219 Gastro-esophageal reflux disease without esophagitis: Secondary | ICD-10-CM

## 2023-01-31 ENCOUNTER — Other Ambulatory Visit: Payer: Self-pay

## 2023-01-31 ENCOUNTER — Ambulatory Visit (INDEPENDENT_AMBULATORY_CARE_PROVIDER_SITE_OTHER): Payer: 59 | Admitting: Student

## 2023-01-31 ENCOUNTER — Encounter: Payer: Self-pay | Admitting: Student

## 2023-01-31 VITALS — BP 138/78 | HR 60 | Ht 65.0 in | Wt 233.0 lb

## 2023-01-31 DIAGNOSIS — R7303 Prediabetes: Secondary | ICD-10-CM

## 2023-01-31 DIAGNOSIS — K219 Gastro-esophageal reflux disease without esophagitis: Secondary | ICD-10-CM

## 2023-01-31 DIAGNOSIS — E785 Hyperlipidemia, unspecified: Secondary | ICD-10-CM | POA: Diagnosis not present

## 2023-01-31 DIAGNOSIS — M810 Age-related osteoporosis without current pathological fracture: Secondary | ICD-10-CM

## 2023-01-31 DIAGNOSIS — Z23 Encounter for immunization: Secondary | ICD-10-CM

## 2023-01-31 MED ORDER — ESOMEPRAZOLE MAGNESIUM 20 MG PO CPDR: 20.0000 mg | DELAYED_RELEASE_CAPSULE | Freq: Every day | ORAL | 3 refills | Status: DC

## 2023-01-31 NOTE — Patient Instructions (Addendum)
Call back in October to get your Prolia scheduled. We will check your cholesterol and A1c today. Thank you for getting caught up on your vaccines. Give some thought to Shingles vaccine.  Since the pepcid is not working, let's go back to a reasonably low dose Nexium for you.   Lisa Mccoy, MD

## 2023-02-01 LAB — LIPID PANEL
Chol/HDL Ratio: 3.7 ratio (ref 0.0–4.4)
Cholesterol, Total: 218 mg/dL — ABNORMAL HIGH (ref 100–199)
HDL: 59 mg/dL (ref >39)
LDL Chol Calc (NIH): 129 mg/dL — ABNORMAL HIGH (ref 0–99)
Triglycerides: 169 mg/dL — ABNORMAL HIGH (ref 0–149)
VLDL Cholesterol Cal: 30 mg/dL (ref 5–40)

## 2023-02-01 LAB — HEMOGLOBIN A1C
Est. average glucose Bld gHb Est-mCnc: 137 mg/dL
Hgb A1c MFr Bld: 6.4 % — ABNORMAL HIGH (ref 4.8–5.6)

## 2023-02-02 NOTE — Assessment & Plan Note (Signed)
Stopped her Nexium when she was diagnosed with osteoporosis. However, has had inadequate symptom control on Pepcid. - Okay to go back to Nexium, 20mg  daily. Stop pepcid.

## 2023-02-02 NOTE — Assessment & Plan Note (Signed)
Coming up due for next Prolia injection next month. She will call when the time comes for me to order this to her pharmacy and then she will come in for a nurse visit for administration.

## 2023-02-04 ENCOUNTER — Telehealth: Payer: Self-pay

## 2023-02-04 MED ORDER — ATORVASTATIN CALCIUM 40 MG PO TABS
40.0000 mg | ORAL_TABLET | Freq: Every day | ORAL | 3 refills | Status: DC
Start: 2023-02-04 — End: 2024-01-20

## 2023-02-04 NOTE — Telephone Encounter (Signed)
Patient calls nurse line regarding results from visit on 01/31/23.  Requesting returned call at [phone].  Veronda Prude, RN

## 2023-02-04 NOTE — Addendum Note (Signed)
Addended by: Darnelle Spangle B on: 02/04/2023 01:15 PM   Modules accepted: Orders

## 2023-02-20 ENCOUNTER — Telehealth: Payer: Self-pay

## 2023-02-20 DIAGNOSIS — M810 Age-related osteoporosis without current pathological fracture: Secondary | ICD-10-CM

## 2023-02-20 NOTE — Telephone Encounter (Signed)
Patient calls nurse line regarding request for prolia injection.   Last injection was 08/26/22. Patient will need repeat CMP, Mag and Phos prior to ordering of medication and scheduling appointment for injection.   Please place future orders and we can call patient to schedule lab visit.   Veronda Prude, RN

## 2023-02-21 NOTE — Telephone Encounter (Signed)
VM left for patient to call the office to schedule a lab visit.

## 2023-02-24 ENCOUNTER — Other Ambulatory Visit: Payer: 59

## 2023-02-24 DIAGNOSIS — M810 Age-related osteoporosis without current pathological fracture: Secondary | ICD-10-CM

## 2023-02-25 LAB — COMPREHENSIVE METABOLIC PANEL
ALT: 18 [IU]/L (ref 0–32)
AST: 23 [IU]/L (ref 0–40)
Albumin: 4.4 g/dL (ref 3.9–4.9)
Alkaline Phosphatase: 98 [IU]/L (ref 44–121)
BUN/Creatinine Ratio: 18 (ref 12–28)
BUN: 14 mg/dL (ref 8–27)
Bilirubin Total: 0.7 mg/dL (ref 0.0–1.2)
CO2: 20 mmol/L (ref 20–29)
Calcium: 10.1 mg/dL (ref 8.7–10.3)
Chloride: 100 mmol/L (ref 96–106)
Creatinine, Ser: 0.78 mg/dL (ref 0.57–1.00)
Globulin, Total: 2.4 g/dL (ref 1.5–4.5)
Glucose: 131 mg/dL — ABNORMAL HIGH (ref 70–99)
Potassium: 5.1 mmol/L (ref 3.5–5.2)
Sodium: 139 mmol/L (ref 134–144)
Total Protein: 6.8 g/dL (ref 6.0–8.5)
eGFR: 84 mL/min/{1.73_m2} (ref >59)

## 2023-02-25 LAB — MAGNESIUM: Magnesium: 2.2 mg/dL (ref 1.6–2.3)

## 2023-02-25 LAB — PHOSPHORUS: Phosphorus: 3.8 mg/dL (ref 3.0–4.3)

## 2023-03-03 ENCOUNTER — Other Ambulatory Visit: Payer: Self-pay | Admitting: Student

## 2023-03-03 DIAGNOSIS — M810 Age-related osteoporosis without current pathological fracture: Secondary | ICD-10-CM

## 2023-03-03 MED ORDER — DENOSUMAB 60 MG/ML ~~LOC~~ SOSY
60.0000 mg | PREFILLED_SYRINGE | Freq: Once | SUBCUTANEOUS | 0 refills | Status: AC
Start: 2023-03-03 — End: 2023-03-03

## 2023-03-03 NOTE — Progress Notes (Signed)
Pt is due for Prolia injection.  Previous monitoring labs ordered by Dr. Marisue Humble have been reviewed, electrolytes and kidney function are WNL.  Order placed for Prolia to be sent to pharmacy.  Patient will bring medication for RN visit to be injected.

## 2023-03-03 NOTE — Telephone Encounter (Signed)
Patient returns call to nurse line regarding lab results.   She is asking that prolia be sent to pharmacy.   Forwarding to PCP.   Veronda Prude, RN

## 2023-03-07 ENCOUNTER — Ambulatory Visit (INDEPENDENT_AMBULATORY_CARE_PROVIDER_SITE_OTHER): Payer: 59

## 2023-03-07 DIAGNOSIS — M810 Age-related osteoporosis without current pathological fracture: Secondary | ICD-10-CM

## 2023-03-07 MED ORDER — DENOSUMAB 60 MG/ML ~~LOC~~ SOSY
60.0000 mg | PREFILLED_SYRINGE | Freq: Once | SUBCUTANEOUS | Status: AC
Start: 2023-03-07 — End: 2023-03-07
  Administered 2023-03-07: 60 mg via SUBCUTANEOUS

## 2023-03-07 NOTE — Progress Notes (Signed)
Patients presents to nurse clinic for initial Prolia injection.    Patient brings own supply.    Prolia administered SubQ in right arm.    All questions answered.

## 2023-03-12 ENCOUNTER — Other Ambulatory Visit: Payer: Self-pay | Admitting: Student

## 2023-03-12 DIAGNOSIS — J4540 Moderate persistent asthma, uncomplicated: Secondary | ICD-10-CM

## 2023-03-13 NOTE — Telephone Encounter (Signed)
Patient calls nurse line to verify that we received request from pharmacy for inhaler.   Advised that we have received and message has been sent to PCP.   Patient appreciative.   Veronda Prude, RN

## 2023-04-18 ENCOUNTER — Other Ambulatory Visit: Payer: Self-pay | Admitting: Student

## 2023-06-20 ENCOUNTER — Ambulatory Visit (INDEPENDENT_AMBULATORY_CARE_PROVIDER_SITE_OTHER): Payer: 59 | Admitting: Student

## 2023-06-20 ENCOUNTER — Encounter: Payer: Self-pay | Admitting: Student

## 2023-06-20 VITALS — BP 116/72 | HR 64 | Wt 240.0 lb

## 2023-06-20 DIAGNOSIS — J4520 Mild intermittent asthma, uncomplicated: Secondary | ICD-10-CM

## 2023-06-20 MED ORDER — ZOSTER VAC RECOMB ADJUVANTED 50 MCG/0.5ML IM SUSR: 0.5000 mL | Freq: Once | INTRAMUSCULAR | 0 refills | Status: AC

## 2023-06-20 MED ORDER — MOMETASONE FURO-FORMOTEROL FUM 200-5 MCG/ACT IN AERO: 2.0000 | INHALATION_SPRAY | Freq: Every day | RESPIRATORY_TRACT | 1 refills | Status: DC

## 2023-06-20 NOTE — Progress Notes (Cosign Needed)
    SUBJECTIVE:   CHIEF COMPLAINT / HPI:   Asthma  Currently using daily Wixela (fluticasone/salmeterol) and Singulair, and has Albuterol PRN. Finds that she gets symptomatic later in the day and into the night as though her medication is wearing off and is needing albuterol. Has lots of coughing. Has been on her current regimen for quite some time.   PERTINENT  PMH / PSH: Asthma, Hx of heavy etOH has since quit, HTN, Prediabetes, macular degeration  OBJECTIVE:   BP 116/72   Pulse 64   Wt 240 lb (108.9 kg)   SpO2 93%   BMI 39.94 kg/m   Gen: Well appearing and NAD  HENT: MMM Cardio: RRR Pulm: Normal WOB on RA, speaking in full sentences, without wheeze, rales, or rhonchi Ext: without edema or deformity    ASSESSMENT/PLAN:   Asthma, chronic Seems that she is having inadequate control with her Wixela use. No wheeze or respiratory distress today.  - Will discontinue Wixela and transition to SMART therapy with [facility]. May still need an occasional evening dose, but should have quicker relief with the faster-acting steroid component.  - Continue Singulair 10mg  daily - Follow-up in 3 months or sooner as needed   Health Maintenance Due for Shingles vaccine. Had been hesitant due to reports of side effects.  - Paper Rx for Shingrix to be given at local pharmacy   J Dorothyann Gibbs, MD [facility]

## 2023-06-20 NOTE — Assessment & Plan Note (Signed)
Seems that she is having inadequate control with her Wixela use. No wheeze or respiratory distress today.  - Will discontinue Wixela and transition to SMART therapy with [facility]. May still need an occasional evening dose, but should have quicker relief with the faster-acting steroid component.  - Continue Singulair 10mg  daily - Follow-up in 3 months or sooner as needed

## 2023-06-20 NOTE — Patient Instructions (Addendum)
Let's STOP the Wixela and start Dulera instead. This is 2 puffs daily AND can be used as a reliever medicine. (Up to 12 doses/day but let me know if you're needing this much because we'll need to make some changes).   Come back to see me in May.   Eliezer Mccoy, MD

## 2023-08-18 ENCOUNTER — Other Ambulatory Visit: Payer: Self-pay | Admitting: Student

## 2023-08-18 DIAGNOSIS — M810 Age-related osteoporosis without current pathological fracture: Secondary | ICD-10-CM

## 2023-08-22 ENCOUNTER — Telehealth: Payer: Self-pay

## 2023-08-22 ENCOUNTER — Other Ambulatory Visit: Payer: Self-pay | Admitting: Student

## 2023-08-22 ENCOUNTER — Ambulatory Visit (INDEPENDENT_AMBULATORY_CARE_PROVIDER_SITE_OTHER): Admitting: Student

## 2023-08-22 ENCOUNTER — Encounter: Payer: Self-pay | Admitting: Student

## 2023-08-22 VITALS — BP 164/98 | HR 65 | Ht 65.0 in | Wt 247.0 lb

## 2023-08-22 DIAGNOSIS — M810 Age-related osteoporosis without current pathological fracture: Secondary | ICD-10-CM

## 2023-08-22 DIAGNOSIS — I10 Essential (primary) hypertension: Secondary | ICD-10-CM

## 2023-08-22 DIAGNOSIS — R7303 Prediabetes: Secondary | ICD-10-CM | POA: Diagnosis not present

## 2023-08-22 DIAGNOSIS — E039 Hypothyroidism, unspecified: Secondary | ICD-10-CM | POA: Diagnosis not present

## 2023-08-22 DIAGNOSIS — E119 Type 2 diabetes mellitus without complications: Secondary | ICD-10-CM

## 2023-08-22 LAB — POCT GLYCOSYLATED HEMOGLOBIN (HGB A1C): HbA1c, POC (controlled diabetic range): 7.2 % — AB (ref 0.0–7.0)

## 2023-08-22 MED ORDER — SEMAGLUTIDE(0.25 OR 0.5MG/DOS) 2 MG/1.5ML ~~LOC~~ SOPN: 0.2500 mg | PEN_INJECTOR | SUBCUTANEOUS | 0 refills | Status: DC

## 2023-08-22 MED ORDER — LOSARTAN POTASSIUM 100 MG PO TABS: ORAL_TABLET | ORAL | 0 refills | Status: DC

## 2023-08-22 NOTE — Patient Instructions (Signed)
 I am checking your blood work for your kidney function and electrolytes before you get your shot for osteoporosis.  Once I have these lab results back the nurses will call you to schedule a visit to get your shot.  You are now in the diabetic range with your A1c at 7.4.  I am ordering Ozempic.  It may take a few weeks for this to be approved.  Your blood pressure was elevated today in the office x 2.  I am increasing your losartan to 100 mg daily.  If you have 50 mg tablets at home you can take 2 tablets until you pick up the new prescription.  Since we are adjusting your blood pressure medication, I will need you to follow-up in 1-2 weeks.

## 2023-08-22 NOTE — Progress Notes (Signed)
    SUBJECTIVE:   CHIEF COMPLAINT / HPI:   Lisa [name] is a 68 y.o. female presenting for follow up for Prolia injections and other chronic conditions  Prediabetes: Patient is not currently on medications.  She is on Lipitor 40 mg daily.  Tolerating medication well.  She has been on Ozempic in the past.  Osteoporosis: Receiving Prolia shots every 6 months.  Here for follow-up for laboratory.  She has not had any reactions to the shot before.  HTN: Patient has been taking losartan 50 mg and metoprolol 100 mg daily.  She reports she does not have any side effects of medication.  She does not check her blood pressure at home consistently.  Does not have any side effects like vision changes, loss of consciousness, headache.  Hypothyroidism: TSH 5.41-year ago.  Recheck TSH today.  Stable on Synthroid 25 mcg daily.  She is asymptomatic at this time.  PERTINENT  PMH / PSH: reviewed and updated.  OBJECTIVE:   BP (!) 164/98   Pulse 65   Ht 5\' 5"  (1.651 m)   Wt 247 lb (112 kg)   SpO2 98%   BMI 41.10 kg/m   Chronically ill-appearing, no acute distress, ambulates without assistance Cardio: Regular rate, regular rhythm, no murmurs on exam. Pulm: Clear, no wheezing, no crackles. No increased work of breathing Abdominal: bowel sounds present, soft, non-tender, non-distended Extremities: no peripheral edema  Neuro: alert and oriented x3, speech normal in content, no facial asymmetry, strength intact and equal bilaterally in UE and LE, pupils equal and reactive to light.  Psych:  Cognition and judgment appear intact. Alert, communicative  and cooperative with normal attention span and concentration. No apparent delusions, illusions, hallucinations    ASSESSMENT/PLAN:   Assessment & Plan Prediabetes A1c elevated outside of the prediabetic range 7.4 today.  With patient's comorbid conditions and osteoarthritis she would benefit from medication.  Prescribing Ozempic 0.25 mg  weekly. Osteoporosis of vertebra CMP, mag, Phos ordered today.  Once test result patient will be eligible to schedule RN visit for Prolia shot. Primary hypertension Blood pressure not at goal.  Increase losartan to 100 mg daily.  Continue metoprolol 100 mg daily.  Follow-up in 1 to 2 weeks.  Patient is not at goal switch to combination pill.  Recheck BMP at next visit. Diabetes mellitus without complication (HCC) Recheck A1c in 3 months If continues to be elevated cholesterol goal less than 70 Hypothyroidism, unspecified type Check TSH today.     Glendale Chard, DO [facility]

## 2023-08-22 NOTE — Assessment & Plan Note (Signed)
 CMP, mag, Phos ordered today.  Once test result patient will be eligible to schedule RN visit for Prolia shot.

## 2023-08-22 NOTE — Assessment & Plan Note (Signed)
 A1c elevated outside of the prediabetic range 7.4 today.  With patient's comorbid conditions and osteoarthritis she would benefit from medication.  Prescribing Ozempic 0.25 mg weekly.

## 2023-08-22 NOTE — Assessment & Plan Note (Addendum)
 Blood pressure not at goal.  Increase losartan to 100 mg daily.  Continue metoprolol 100 mg daily.  Follow-up in 1 to 2 weeks.  Patient is not at goal switch to combination pill.  Recheck BMP at next visit.

## 2023-08-22 NOTE — Telephone Encounter (Signed)
 Pharmacy Patient Advocate Encounter   Received notification from CoverMyMeds that prior authorization for [facility] is required/requested.   Insurance verification completed.   The patient is insured through [facility] .   PA required; PA submitted to above mentioned insurance via CoverMyMeds Key/confirmation #/EOC [id number]. Status is pending

## 2023-08-22 NOTE — Assessment & Plan Note (Signed)
 Check TSH today

## 2023-08-23 ENCOUNTER — Encounter: Payer: Self-pay | Admitting: Student

## 2023-08-23 LAB — COMPREHENSIVE METABOLIC PANEL WITH GFR
ALT: 17 IU/L (ref 0–32)
AST: 26 IU/L (ref 0–40)
Albumin: 4.2 g/dL (ref 3.9–4.9)
Alkaline Phosphatase: 104 IU/L (ref 44–121)
BUN/Creatinine Ratio: 15 (ref 12–28)
BUN: 13 mg/dL (ref 8–27)
Bilirubin Total: 0.9 mg/dL (ref 0.0–1.2)
CO2: 21 mmol/L (ref 20–29)
Calcium: 9.9 mg/dL (ref 8.7–10.3)
Chloride: 102 mmol/L (ref 96–106)
Creatinine, Ser: 0.87 mg/dL (ref 0.57–1.00)
Globulin, Total: 2.2 g/dL (ref 1.5–4.5)
Glucose: 126 mg/dL — ABNORMAL HIGH (ref 70–99)
Potassium: 4.3 mmol/L (ref 3.5–5.2)
Sodium: 140 mmol/L (ref 134–144)
Total Protein: 6.4 g/dL (ref 6.0–8.5)
eGFR: 73 mL/min/{1.73_m2} (ref >59)

## 2023-08-23 LAB — PHOSPHORUS: Phosphorus: 3.6 mg/dL (ref 3.0–4.3)

## 2023-08-23 LAB — MAGNESIUM: Magnesium: 1.9 mg/dL (ref 1.6–2.3)

## 2023-08-23 LAB — TSH RFX ON ABNORMAL TO FREE T4: TSH: 3.15 u[IU]/mL (ref 0.450–4.500)

## 2023-08-25 NOTE — Telephone Encounter (Signed)
 Pharmacy Patient Advocate Encounter  Received notification from [facility] that Prior Authorization for [facility] 0.25/0.5MG  has been APPROVED from 08/22/23 to 05/19/24   PA #/Case ID/Reference #: [id number]

## 2023-08-29 ENCOUNTER — Ambulatory Visit

## 2023-08-29 VITALS — BP 138/84 | HR 69 | Temp 98.1°F

## 2023-08-29 DIAGNOSIS — M810 Age-related osteoporosis without current pathological fracture: Secondary | ICD-10-CM

## 2023-08-29 MED ORDER — DENOSUMAB 60 MG/ML ~~LOC~~ SOSY
60.0000 mg | PREFILLED_SYRINGE | Freq: Once | SUBCUTANEOUS | Status: AC
Start: 2023-09-12 — End: 2023-08-29
  Administered 2023-08-29: 60 mg via SUBCUTANEOUS

## 2023-08-29 NOTE — Progress Notes (Signed)
 Patient presents to nurse clinic for Prolia injection. Patient supplied medication.   Vitals obtained at today's visit. See below.   Today's Vitals   08/29/23 0834  BP: 138/84  Pulse: 69  Temp: 98.1 F (36.7 C)  SpO2: 96%   Spoke with Dr. McDiarmid as patient is one week from 6 month period for Prolia. Advised that we could proceed with Prolia injection.   Administered SQ in left arm. Patient tolerated injection well.   Patient reports that BP has been running elevated since starting [facility] inhaler. Recommended that patient schedule appointment with either resident or Dr. Raymondo Band to further discuss medication management. Patient voices understanding.   Veronda Prude, RN

## 2023-09-22 ENCOUNTER — Other Ambulatory Visit: Payer: Self-pay | Admitting: Student

## 2023-09-22 ENCOUNTER — Telehealth: Payer: Self-pay

## 2023-09-22 DIAGNOSIS — J4520 Mild intermittent asthma, uncomplicated: Secondary | ICD-10-CM

## 2023-09-22 DIAGNOSIS — E119 Type 2 diabetes mellitus without complications: Secondary | ICD-10-CM

## 2023-09-22 DIAGNOSIS — I10 Essential (primary) hypertension: Secondary | ICD-10-CM

## 2023-09-22 MED ORDER — MOMETASONE FURO-FORMOTEROL FUM 200-5 MCG/ACT IN AERO: 2.0000 | INHALATION_SPRAY | Freq: Every day | RESPIRATORY_TRACT | 3 refills | Status: DC

## 2023-09-22 MED ORDER — SEMAGLUTIDE(0.25 OR 0.5MG/DOS) 2 MG/1.5ML ~~LOC~~ SOPN: 0.5000 mg | PEN_INJECTOR | SUBCUTANEOUS | 0 refills | Status: DC

## 2023-09-22 NOTE — Addendum Note (Signed)
 Addended by: Darell Echevaria B on: 09/22/2023 12:43 PM   Modules accepted: Orders

## 2023-09-22 NOTE — Telephone Encounter (Signed)
 Patients insurance Recruitment consultant line in regards to medications.   She is requesting a #90 day supply of Dulera .   She is requesting a #84 day supply of Ozempic .  She reports the plan with cover quanties.   [facility] [address]

## 2023-10-07 ENCOUNTER — Other Ambulatory Visit: Payer: Self-pay | Admitting: Student

## 2023-10-17 ENCOUNTER — Ambulatory Visit (INDEPENDENT_AMBULATORY_CARE_PROVIDER_SITE_OTHER): Admitting: Student

## 2023-10-17 VITALS — BP 128/82 | HR 78 | Ht 65.0 in | Wt 247.8 lb

## 2023-10-17 DIAGNOSIS — E119 Type 2 diabetes mellitus without complications: Secondary | ICD-10-CM

## 2023-10-17 DIAGNOSIS — M1710 Unilateral primary osteoarthritis, unspecified knee: Secondary | ICD-10-CM | POA: Diagnosis not present

## 2023-10-17 DIAGNOSIS — M2242 Chondromalacia patellae, left knee: Secondary | ICD-10-CM

## 2023-10-17 MED ORDER — METHYLPREDNISOLONE ACETATE 40 MG/ML IJ SUSP
40.0000 mg | Freq: Once | INTRAMUSCULAR | Status: AC
  Administered 2023-10-17: 40 mg via INTRAMUSCULAR

## 2023-10-17 MED ORDER — SEMAGLUTIDE (1 MG/DOSE) 4 MG/3ML ~~LOC~~ SOPN: 1.0000 mg | PEN_INJECTOR | SUBCUTANEOUS | 1 refills | Status: DC

## 2023-10-17 MED ORDER — SEMAGLUTIDE(0.25 OR 0.5MG/DOS) 2 MG/1.5ML ~~LOC~~ SOPN: 0.5000 mg | PEN_INJECTOR | SUBCUTANEOUS | 0 refills | Status: DC

## 2023-10-17 NOTE — Patient Instructions (Addendum)
 Ms. Lisa, [name] to see you! We can increase your Ozempic  to 1mg /week.  We injected your knees. I will place a referral to our IR colleagues to discuss GAE. They may want to hold off three months on actually doing a procedure, but its worth talking to them and getting the ball rolling if they think it is a good idea.   Alexa Andrews, MD

## 2023-10-17 NOTE — Progress Notes (Signed)
    SUBJECTIVE:   CHIEF COMPLAINT / HPI:   Knee OA Lisa [name] has advanced osteoarthritis of both knees.  Lisa [name] gets somewhat regular steroid injections.  Lisa [name] does have a history of getting gel injections in the past but found these to be prohibitively painful and is not interested in pursuing this route again.  Lisa [name] is looking into more permanent options to treat Lisa [name] OA including a knee replacement versus a geniculate artery embolization.  At this point, Lisa [name] is leaning towards a possible geniculate artery embolization but has many questions about the procedure.  Regardless, Lisa [name] would like bilateral knee injections with steroid today, understanding that this may delay any potential surgical intervention by 3 months.  Diabetes A1c is well-controlled at 7.2%.  Lisa [name] is interested in increasing Lisa [name] Ozempic  dose as Lisa [name] has only seen about 1 pound of weight loss since starting the medication.  Lisa [name] has most recently been on the 0.5 mg dose for about 3 weeks.  Lisa [name] is tolerating this without any significant GI upset or nausea.   OBJECTIVE:   BP 128/82   Pulse 78   Ht 5\' 5"  (1.651 m)   Wt 247 lb 12.8 oz (112.4 kg)   SpO2 96%   BMI 41.24 kg/m   Physical Exam Vitals reviewed.  Constitutional:      General: Lisa [name] is not in acute distress. Cardiovascular:     Rate and Rhythm: Normal rate and regular rhythm.  Pulmonary:     Effort: Pulmonary effort is normal. No respiratory distress.     Breath sounds: No wheezing, rhonchi or rales.  Musculoskeletal:     Right knee: Crepitus present. No swelling or deformity.     Left knee: Crepitus present. No swelling or deformity.     Comments: + Joint line tenderness bilaterally   Skin:    General: Skin is warm and dry.      ASSESSMENT/PLAN:   Assessment & Plan Arthritis of knee -After informed consent obtained, a steroid injection was performed at the bilateral knees using a medial approach using 4mL 1% plain Lidocaine and 40 mg of methylprednisolone  in  each knee. This was well tolerated. -Referral to IR to discuss geniculate artery embolization further   Diabetes mellitus without complication (HCC) A1c 7.2%.  We do have room to improve with Lisa [name] weight loss.  Therefore will increase Lisa [name] Ozempic  dose today. -Increase semaglutide  to 1 mg weekly. -Urine albumin creatinine ratio today, is on an ARB -Is on a statin, Lipitor 40 mg daily     J Lark Plum, MD [facility]

## 2023-10-18 LAB — MICROALBUMIN / CREATININE URINE RATIO
Creatinine, Urine: 28.8 mg/dL
Microalb/Creat Ratio: <10 mg/g{creat} (ref 0–29)
Microalbumin, Urine: <3 ug/mL

## 2023-10-19 ENCOUNTER — Ambulatory Visit: Payer: Self-pay | Admitting: Student

## 2023-10-19 NOTE — Assessment & Plan Note (Signed)
-  After informed consent obtained, a steroid injection was performed at the bilateral knees using a medial approach using 4mL 1% plain Lidocaine and 40 mg of methylprednisolone  in each knee. This was well tolerated. -Referral to IR to discuss geniculate artery embolization further

## 2023-10-19 NOTE — Addendum Note (Signed)
 Addended by: Darell Echevaria B on: 10/19/2023 12:40 PM   Modules accepted: Level of Service

## 2023-10-28 ENCOUNTER — Encounter: Payer: Self-pay | Admitting: *Deleted

## 2023-12-11 ENCOUNTER — Encounter

## 2024-01-08 ENCOUNTER — Other Ambulatory Visit: Payer: Self-pay | Admitting: *Deleted

## 2024-01-09 ENCOUNTER — Other Ambulatory Visit: Payer: Self-pay

## 2024-01-09 ENCOUNTER — Ambulatory Visit: Admitting: Family Medicine

## 2024-01-09 ENCOUNTER — Telehealth: Payer: Self-pay

## 2024-01-09 DIAGNOSIS — I10 Essential (primary) hypertension: Secondary | ICD-10-CM

## 2024-01-09 MED ORDER — METOPROLOL SUCCINATE ER 100 MG PO TB24: ORAL_TABLET | ORAL | 3 refills | Status: AC

## 2024-01-09 NOTE — Telephone Encounter (Signed)
 Patient calls nurse line to check status of referral for Genicular artery embolization. Dr. Marlee had previously referred patient to [facility] on 10/17/23.  Patient attempted to call and schedule, however, was told that they never received the referral.   Will forward to [facility] for further advisement.   [name], RN

## 2024-01-20 ENCOUNTER — Ambulatory Visit (INDEPENDENT_AMBULATORY_CARE_PROVIDER_SITE_OTHER): Admitting: Family Medicine

## 2024-01-20 ENCOUNTER — Other Ambulatory Visit: Payer: Self-pay | Admitting: Student

## 2024-01-20 ENCOUNTER — Other Ambulatory Visit: Payer: Self-pay | Admitting: Interventional Radiology

## 2024-01-20 ENCOUNTER — Encounter: Payer: Self-pay | Admitting: Family Medicine

## 2024-01-20 ENCOUNTER — Other Ambulatory Visit: Payer: Self-pay | Admitting: *Deleted

## 2024-01-20 VITALS — BP 117/80 | HR 84 | Temp 97.5°F | Ht 64.0 in | Wt 245.2 lb

## 2024-01-20 DIAGNOSIS — M170 Bilateral primary osteoarthritis of knee: Secondary | ICD-10-CM

## 2024-01-20 DIAGNOSIS — E785 Hyperlipidemia, unspecified: Secondary | ICD-10-CM

## 2024-01-20 MED ORDER — ATORVASTATIN CALCIUM 40 MG PO TABS: 40.0000 mg | ORAL_TABLET | Freq: Every day | ORAL | 3 refills | Status: AC

## 2024-01-20 MED ORDER — METHYLPREDNISOLONE SODIUM SUCC 125 MG IJ SOLR
125.0000 mg | Freq: Once | INTRAMUSCULAR | Status: AC
  Administered 2024-01-20: 125 mg via INTRAMUSCULAR

## 2024-01-20 NOTE — Assessment & Plan Note (Signed)
 PROCEDURE: Bilateral knee steroid injections Patient was given informed consent, signed copy in the chart. Appropriate time out was taken. Area prepped and in usual sterile fashion. Ethyl chloride was used for local anesthesia. A 21 gauge 1 1/2 inch needle was used. 4 cc of methylprednisolone  40 mg/ml plus 1 cc of 1% lidocaine without epinephrine was injected into the bilateral knee joints using a medial approach.  The patient tolerated the procedure well. There were no complications. Post procedure instructions were given.   Patient also desires follow up with DR at [facility] for geniculate artery embolization.  Referral was placed 5/30 and authorized previously, but she reports the office told her referral was never received.  Messaging referral coordinator Margit Dimes to resend.

## 2024-01-20 NOTE — Progress Notes (Signed)
   SUBJECTIVE:   CHIEF COMPLAINT / HPI:  Lisa [name] is a 68 y.o. female with a pertinent past medical history of severe bilateral knee osteoarthritis, GERD, HTN, and HLD presenting to the clinic for bilateral knee injections.  Patient has has last received bilateral knee injections 10/17/2023 with Dr. Marlee. She is still considering geniculate artery embolization. Would like to pursue further workup and evaluation for this procedure.  Has a referral placed to [facility] at [facility] in May 2025, but when she called office they stated they never received a referral. She has previously had a poor response to gel injections and found the procedure extremely painful. Today, she requests bilateral knee injections, understanding that the injections may delay surgical intervention 3 months. She states that her injections help her for about 2-3 months when she gets them.  To [facility] office for consideration of Genicular Artery Embolization.  PERTINENT PMH / PSH: Knee osteoarthritis HTN, GERD, HLD  *Remainder reviewed in problem list.   OBJECTIVE:   BP 117/80   Pulse 84   Temp (!) 97.5 F (36.4 C)   Ht 5' 4 (1.626 m)   Wt 245 lb 3.2 oz (111.2 kg)   SpO2 94%   BMI 42.09 kg/m   General: Age-appropriate, resting comfortably in chair, NAD, alert and at baseline. Cardiovascular: Regular rate and rhythm. Normal S1/S2. No murmurs, rubs, or gallops appreciated. 2+ radial pulses. Pulmonary: Normal WOB on room air. No accessory muscle use. Skin: Warm and dry. Extremities: No peripheral edema bilaterally. Capillary refill <2 seconds. MSK: Full ROM of bilateral knees with no TTP or deformities palpable.  Mildly antalgic gait.   ASSESSMENT/PLAN:   Assessment & Plan Bilateral primary osteoarthritis of knee PROCEDURE: Bilateral knee steroid injections Patient was given informed consent, signed copy in the chart. Appropriate time out was taken. Area prepped and in usual sterile  fashion. Ethyl chloride was used for local anesthesia. A 21 gauge 1 1/2 inch needle was used. 4 cc of methylprednisolone  40 mg/ml plus 1 cc of 1% lidocaine without epinephrine was injected into the bilateral knee joints using a medial approach.  The patient tolerated the procedure well. There were no complications. Post procedure instructions were given.   Patient also desires follow up with DR at [facility] for geniculate artery embolization.  Referral was placed 5/30 and authorized previously, but she reports the office told her referral was never received.  Messaging referral coordinator Margit Dimes to resend.  Return if symptoms worsen or fail to improve.  Lain Tetterton Toma, MD [facility]

## 2024-01-20 NOTE — Patient Instructions (Signed)
 Today you received an injection with corticosteroid. This injection is usually done in response to pain and inflammation. There is some "numbing" medicine also in the shot so the injected area may be numb and feel really good for the next couple of hours. The numbing medicine usually wears off in 2-3 hours though, and then your pain level will be right back where it was before the injection.   The actually benefit from the steroid injection is usually noticed in 2-7 days. You may actually experience a small (as in 10%) INCREASE in pain in the first 24 hours---that is common.   Things to watch out for that you should contact us  or a health care provider urgently would include: 1. Unusual (as in more than 10%) increase in pain 2. New fever > 101.5 3. New swelling or redness of the injected area.  4. Streaking of red lines around the area injected.

## 2024-01-20 NOTE — Addendum Note (Signed)
 Addended by: ANTONETTA BROWNING R on: 01/20/2024 03:55 PM   Modules accepted: Orders

## 2024-01-22 ENCOUNTER — Other Ambulatory Visit: Payer: Self-pay

## 2024-01-22 DIAGNOSIS — K219 Gastro-esophageal reflux disease without esophagitis: Secondary | ICD-10-CM

## 2024-01-22 MED ORDER — ESOMEPRAZOLE MAGNESIUM 20 MG PO CPDR: 20.0000 mg | DELAYED_RELEASE_CAPSULE | Freq: Every day | ORAL | 3 refills | Status: AC

## 2024-01-29 ENCOUNTER — Ambulatory Visit

## 2024-01-29 DIAGNOSIS — Z23 Encounter for immunization: Secondary | ICD-10-CM

## 2024-01-29 NOTE — Progress Notes (Signed)
 Patients presents to [facility] requesting a flu shot at her husbands apt with PCP.  Vaccine given without complication.  See admin for details.

## 2024-01-30 NOTE — Progress Notes (Signed)
 Chief Complaint: Patient was seen in consultation today for bilateral knee pain.   Referring Physician(s): Sanford,James B  History of Present Illness: Lisa [name] is a 68 y.o. female with a medical history significant for HTN, obesity and severe bilateral knee osteoarthritis. She has received bilateral steroid injections for many years and she also also tried gel injections. The gel injections are very painful and she does not wish to receive these anymore. The patient would like a more permanent option to treat her OA and her PCP discussed knee replacement versus geniculate artery embolization. The patient is leaning towards GAE and she has been referred to Interventional Radiology for further discussion.   Womac Pain Score = 65/96 VAS Pain Score = 8/10  Her pain has been progressively worsening for years.  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  She would like to lose some weight by exercising, but is very limited in her activity due to her pain.  Her pain is worst when climbing stairs and with standing.  She has stiffness that is worse in the evening.  She does not want to pursue surgery due to several friends of hers that have had knee replacements without any benefit.     Past Medical History:  Diagnosis Date   Alcoholism (HCC)    Chronic -- not interested in cutting back.  Has cut back from 12 beers daily in 2014.     Allergy    Asthma    GERD (gastroesophageal reflux disease)    Hypertension    Hypothyroidism     Past Surgical History:  Procedure Laterality Date   APPENDECTOMY     CESAREAN SECTION     COLONOSCOPY  2010   [facility] - Repeat in 10 years as normal findings    TONSILLECTOMY AND ADENOIDECTOMY     TUBAL LIGATION      Allergies: Ace inhibitors and Penicillins  Medications: Prior to Admission medications   Medication Sig Start Date End Date Taking? Authorizing Provider  albuterol  (PROVENTIL ) (2.5  MG/3ML) 0.083% nebulizer solution Take 3 mLs (2.5 mg total) by nebulization every 6 (six) hours as needed for wheezing or shortness of breath. 11/27/20   Brimage, Vondra, DO  albuterol  (VENTOLIN  HFA) 108 (90 Base) MCG/ACT inhaler INHALE 2 PUFFS BY MOUTH EVERY 6 HOURS IF NEEDED FOR WHEEZING 11/02/21   Marlee Lynwood NOVAK, MD  aspirin  81 MG EC tablet Take 81 mg by mouth daily.    [provider]  atorvastatin  (LIPITOR) 40 MG tablet Take 1 tablet (40 mg total) by mouth daily. 01/20/24   Larraine Palma, MD  calcium -vitamin D (OSCAL WITH D) 500-5 MG-MCG tablet Take 2 tablets by mouth daily with breakfast. 08/13/22   Marlee Lynwood NOVAK, MD  cetirizine  (ZYRTEC ) 10 MG tablet TAKE 1 TABLET BY MOUTH ONCE DAILY Patient taking differently: Take 10 mg by mouth daily. 11/24/17   Elpidio Reyes DEL, MD  esomeprazole  (NEXIUM ) 20 MG capsule Take 1 capsule (20 mg total) by mouth daily at 12 noon. 01/22/24   Larraine Palma, MD  levothyroxine  (SYNTHROID ) 25 MCG tablet TAKE 1 TABLET(25 MCG) BY MOUTH DAILY 10/09/23   Marlee Lynwood NOVAK, MD  losartan  (COZAAR ) 100 MG tablet TAKE 1 TABLET BY MOUTH DAILY 09/22/23   Sanford, James B, MD  metoprolol  succinate (TOPROL -XL) 100 MG 24 hr tablet TAKE 1 TABLET(100 MG) BY MOUTH DAILY WITH OR IMMEDIATELY FOLLOWING A MEAL 01/09/24   Lennie, Raguel MATSU,  DO  mometasone -formoterol  (DULERA ) 200-5 MCG/ACT AERO Inhale 2 puffs into the lungs daily. 09/22/23   Marlee Lynwood NOVAK, MD  montelukast  (SINGULAIR ) 10 MG tablet TAKE 1 TABLET(10 MG) BY MOUTH DAILY 04/22/23   Marlee Lynwood NOVAK, MD  PROLIA  60 MG/ML SOSY injection INJECT 60MG  INTO THE SKIN ONCE FOR 1 DOSE 08/22/23   Cleotilde Perkins, DO  Semaglutide , 1 MG/DOSE, 4 MG/3ML SOPN Inject 1 mg into the skin once a week. 10/17/23   Marlee Lynwood NOVAK, MD     Family History  Problem Relation Age of Onset   COPD Mother    Heart disease Father    COPD Father    Lung cancer Father    COPD Brother     Social History   Socioeconomic History   Marital status: Married     Spouse name: [name]    Number of children: 2   Years of education: 12   Highest education level: Not on file  Occupational History   Not on file  Tobacco Use   Smoking status: Former    Passive exposure: Past   Smokeless tobacco: Former    Quit date: 03/07/1987  Substance and Sexual Activity   Alcohol use: Yes    Alcohol/week: 3.0 standard drinks of alcohol    Types: 3 Cans of beer per week   Drug use: Never   Sexual activity: Yes    Birth control/protection: Post-menopausal, Surgical  Other Topics Concern   Not on file  Social History Narrative   Patient lives with her husband [name].    Patient has 2 adult children, with 6 grandchildren, 1 great grandchild.   Patients family lives all on the same street.    Patient works at [employer] a couple days a week.   Patient enjoys spending time with family and listening to audio books.    Social Drivers of Corporate investment banker Strain: Low Risk  (10/07/2022)   Overall Financial Resource Strain (CARDIA)    Difficulty of Paying Living Expenses: Not hard at all  Food Insecurity: No Food Insecurity (10/07/2022)   Hunger Vital Sign    Worried About Running Out of Food in the Last Year: Never true    Ran Out of Food in the Last Year: Never true  Transportation Needs: No Transportation Needs (10/07/2022)   PRAPARE - Administrator, Civil Service (Medical): No    Lack of Transportation (Non-Medical): No  Physical Activity: Sufficiently Active (10/07/2022)   Exercise Vital Sign    Days of Exercise per Week: 5 days    Minutes of Exercise per Session: 30 min  Stress: No Stress Concern Present (10/07/2022)   [employer] of Occupational Health - Occupational Stress Questionnaire    Feeling of Stress : Only a little  Social Connections: Moderately Integrated (10/07/2022)   Social Connection and Isolation Panel    Frequency of Communication with Friends and Family: More than three times a week    Frequency of Social  Gatherings with Friends and Family: Three times a week    Attends Religious Services: 1 to 4 times per year    Active Member of Clubs or Organizations: No    Attends Banker Meetings: Never    Marital Status: Married    Review of Systems: A 12 point ROS discussed and pertinent positives are indicated in the HPI above.  All other systems are negative.  Vital Signs: There were no vitals taken for this visit.  Advance Care  Plan: The advanced care plan/surrogate decision maker was discussed at the time of visit and documented in the medical record.    Physical Exam Constitutional:      General: She is not in acute distress. HENT:     Head: Normocephalic.     Nose: Nose normal.  Eyes:     General: No scleral icterus. Cardiovascular:     Rate and Rhythm: Normal rate and regular rhythm.  Pulmonary:     Effort: No respiratory distress.  Abdominal:     General: There is no distension.  Musculoskeletal:       Legs:     Comments: Tender to palpation.  Skin:    General: Skin is warm and dry.  Neurological:     Mental Status: She is alert. She is disoriented.     Imaging: Knee radiographs 02/02/24   Kellgren and Jerilynn Grade III bilaterally   Labs:  CBC: No results for input(s): WBC, HGB, HCT, PLT in the last 8760 hours.  COAGS: No results for input(s): INR, APTT in the last 8760 hours.  BMP:   LIVER FUNCTION TESTS: Recent Labs    02/24/23 1230 08/22/23 0907  BILITOT 0.7 0.9  AST 23 26  ALT 18 17  ALKPHOS 98 104  PROT 6.8 6.4  ALBUMIN 4.4 4.2    TUMOR MARKERS: No results for input(s): AFPTM, CEA, CA199, CHROMGRNA in the last 8760 hours.  Assessment and Plan: 68 year old female with a history of advanced bilateral knee osteoarthritis (K&G III) with severe, lifestyle limiting knee pain (WOMAC 65/96).  Her left knee bothers her more than her right.  She would be an excellent candidate for geniculate artery embolization.  We  discussed the rationale, periprocedural expectations, and long term expected outcomes after geniculate artery embolization.  She would like to proceed, starting with the left knee.    Plan for left knee geniculate artery embolization at [facility] with moderate sedation via antegrade femoral approach.  If she responds well, we can then consider treatment of the right knee at a later date.    Ester Sides, MD Pager: [phone]    I spent a total of  40 Minutes   in face to face in clinical consultation, greater than 50% of which was counseling/coordinating care for bilateral knee pain.

## 2024-02-02 ENCOUNTER — Ambulatory Visit
Admission: RE | Admit: 2024-02-02 | Discharge: 2024-02-02 | Disposition: A | Discharge status: Home / Self Care | Source: Ambulatory Visit | Attending: Interventional Radiology | Admitting: Interventional Radiology

## 2024-02-02 ENCOUNTER — Ambulatory Visit
Admission: RE | Admit: 2024-02-02 | Discharge: 2024-02-02 | Disposition: A | Discharge status: Home / Self Care | Source: Ambulatory Visit | Attending: Student | Admitting: Student

## 2024-02-02 ENCOUNTER — Other Ambulatory Visit: Payer: Self-pay | Admitting: Interventional Radiology

## 2024-02-02 ENCOUNTER — Other Ambulatory Visit

## 2024-02-02 DIAGNOSIS — M170 Bilateral primary osteoarthritis of knee: Secondary | ICD-10-CM

## 2024-02-02 DIAGNOSIS — M1712 Unilateral primary osteoarthritis, left knee: Secondary | ICD-10-CM

## 2024-02-02 HISTORY — PX: IR RADIOLOGIST EVAL & MGMT: IMG5224

## 2024-02-10 ENCOUNTER — Other Ambulatory Visit: Payer: Self-pay | Admitting: *Deleted

## 2024-02-10 DIAGNOSIS — E119 Type 2 diabetes mellitus without complications: Secondary | ICD-10-CM

## 2024-02-10 MED ORDER — SEMAGLUTIDE (1 MG/DOSE) 4 MG/3ML ~~LOC~~ SOPN: 1.0000 mg | PEN_INJECTOR | SUBCUTANEOUS | 1 refills | Status: DC

## 2024-02-11 ENCOUNTER — Telehealth: Payer: Self-pay

## 2024-02-11 DIAGNOSIS — M170 Bilateral primary osteoarthritis of knee: Secondary | ICD-10-CM

## 2024-02-11 MED ORDER — METHYLPREDNISOLONE 4 MG PO TBPK: ORAL_TABLET | ORAL | 0 refills | Status: DC

## 2024-02-11 NOTE — Progress Notes (Signed)
 See telephone note

## 2024-02-11 NOTE — Discharge Instructions (Signed)

## 2024-02-12 NOTE — H&P (Signed)
 Chief Complaint: Patient was seen in consultation today for left knee pain  Referring Physician(s): Dr. Lynwood Gasman  Supervising Physician: Jennefer Rover  Patient Status: Lisa [name]  History of Present Illness: Lisa [name] is a 68 y.o. female with past medical history of HTN, obesity and severe bilateral knee osteoarthritis incompletely controlled with steroid and gel injections.  She desires more long-term pain management and has been seen in consultation with Dr. Jennefer 02/02/24 to discuss geniculate artery embolization.  Patient has elected to proceed with intervention and presents to [facility] for procedure today.   She presents in her usual state of health.  She has been NPO.  She does not take blood thinners.  Post-procedure care and transportation have been arranged with her husband.   Lisa [name] is aware of the goals, risks, and benefits of the procedure and is agreeable to proceed.   She is FULL CODE for procedural purposes today.   Past Medical History:  Diagnosis Date   Alcoholism (HCC)    Chronic -- not interested in cutting back.  Has cut back from 12 beers daily in 2014.     Allergy    Asthma    GERD (gastroesophageal reflux disease)    Hypertension    Hypothyroidism     Past Surgical History:  Procedure Laterality Date   APPENDECTOMY     CESAREAN SECTION     COLONOSCOPY  2010   [facility] - Repeat in 10 years as normal findings    IR RADIOLOGIST EVAL & MGMT  02/02/2024   TONSILLECTOMY AND ADENOIDECTOMY     TUBAL LIGATION      Allergies: Ace inhibitors and Penicillins  Medications: Prior to Admission medications   Medication Sig Start Date End Date Taking? Authorizing Provider  albuterol  (PROVENTIL ) (2.5 MG/3ML) 0.083% nebulizer solution Take 3 mLs (2.5 mg total) by nebulization every 6 (six) hours as needed for wheezing or shortness of breath. 11/27/20   Brimage, Vondra, DO  albuterol  (VENTOLIN  HFA) 108 (90 Base) MCG/ACT inhaler  INHALE 2 PUFFS BY MOUTH EVERY 6 HOURS IF NEEDED FOR WHEEZING 11/02/21   Gasman Lynwood NOVAK, MD  aspirin  81 MG EC tablet Take 81 mg by mouth daily.    [provider]  atorvastatin  (LIPITOR) 40 MG tablet Take 1 tablet (40 mg total) by mouth daily. 01/20/24   Larraine Palma, MD  calcium -vitamin D (OSCAL WITH D) 500-5 MG-MCG tablet Take 2 tablets by mouth daily with breakfast. 08/13/22   Gasman Lynwood NOVAK, MD  cetirizine  (ZYRTEC ) 10 MG tablet TAKE 1 TABLET BY MOUTH ONCE DAILY Patient taking differently: Take 10 mg by mouth daily. 11/24/17   Elpidio Reyes DEL, MD  esomeprazole  (NEXIUM ) 20 MG capsule Take 1 capsule (20 mg total) by mouth daily at 12 noon. 01/22/24   Larraine Palma, MD  levothyroxine  (SYNTHROID ) 25 MCG tablet TAKE 1 TABLET(25 MCG) BY MOUTH DAILY 10/09/23   Gasman Lynwood NOVAK, MD  losartan  (COZAAR ) 100 MG tablet TAKE 1 TABLET BY MOUTH DAILY 09/22/23   Gasman Lynwood NOVAK, MD  methylPREDNISolone  (MEDROL  DOSEPAK) 4 MG TBPK tablet Take as prescribed by pharmacy 02/13/24   Jennefer Rover PARAS, MD  metoprolol  succinate (TOPROL -XL) 100 MG 24 hr tablet TAKE 1 TABLET(100 MG) BY MOUTH DAILY WITH OR IMMEDIATELY FOLLOWING A MEAL 01/09/24   Baker, Raguel MATSU, DO  mometasone -formoterol  (DULERA ) 200-5 MCG/ACT AERO Inhale 2 puffs into the lungs daily. 09/22/23   Gasman Lynwood NOVAK, MD  montelukast  (SINGULAIR ) 10 MG tablet  TAKE 1 TABLET(10 MG) BY MOUTH DAILY 04/22/23   Marlee Lynwood NOVAK, MD  PROLIA  60 MG/ML SOSY injection INJECT 60MG  INTO THE SKIN ONCE FOR 1 DOSE 08/22/23   Cleotilde Perkins, DO  Semaglutide , 1 MG/DOSE, 4 MG/3ML SOPN Inject 1 mg into the skin once a week. 02/10/24   Larraine Palma, MD     Family History  Problem Relation Age of Onset   COPD Mother    Heart disease Father    COPD Father    Lung cancer Father    COPD Brother     Social History   Socioeconomic History   Marital status: Married    Spouse name: [name]    Number of children: 2   Years of education: 12   Highest education level: Not on  file  Occupational History   Not on file  Tobacco Use   Smoking status: Former    Passive exposure: Past   Smokeless tobacco: Former    Quit date: 03/07/1987  Substance and Sexual Activity   Alcohol use: Yes    Alcohol/week: 3.0 standard drinks of alcohol    Types: 3 Cans of beer per week   Drug use: Never   Sexual activity: Yes    Birth control/protection: Post-menopausal, Surgical  Other Topics Concern   Not on file  Social History Narrative   Patient lives with her husband [name].    Patient has 2 adult children, with 6 grandchildren, 1 great grandchild.   Patients family lives all on the same street.    Patient works at [employer] a couple days a week.   Patient enjoys spending time with family and listening to audio books.    Social Drivers of Corporate investment banker Strain: [name] Risk  (10/07/2022)   Overall Financial Resource Strain (CARDIA)    Difficulty of Paying Living Expenses: Not hard at all  Food Insecurity: No Food Insecurity (10/07/2022)   Hunger Vital Sign    Worried About Running Out of Food in the Last Year: Never true    Ran Out of Food in the Last Year: Never true  Transportation Needs: No Transportation Needs (10/07/2022)   PRAPARE - Administrator, Civil Service (Medical): No    Lack of Transportation (Non-Medical): No  Physical Activity: Sufficiently Active (10/07/2022)   Exercise Vital Sign    Days of Exercise per Week: 5 days    Minutes of Exercise per Session: 30 min  Stress: No Stress Concern Present (10/07/2022)   [employer] of Occupational Health - Occupational Stress Questionnaire    Feeling of Stress : Only a little  Social Connections: Moderately Integrated (10/07/2022)   Social Connection and Isolation Panel    Frequency of Communication with Friends and Family: More than three times a week    Frequency of Social Gatherings with Friends and Family: Three times a week    Attends Religious Services: 1 to 4 times per year     Active Member of Clubs or Organizations: No    Attends Banker Meetings: Never    Marital Status: Married     Review of Systems: A 12 point ROS discussed and pertinent positives are indicated in the HPI above.  All other systems are negative.  Review of Systems  Constitutional:  Negative for fatigue and fever.  Respiratory:  Negative for cough and shortness of breath.   Cardiovascular:  Negative for chest pain.  Gastrointestinal:  Negative for abdominal pain, nausea and vomiting.  Musculoskeletal:  Positive for myalgias (bilateral knee pain). Negative for back pain.  Psychiatric/Behavioral:  Negative for behavioral problems and confusion.     Vital Signs: BP (!) 167/88 (BP Location: Left Arm, Patient Position: Sitting, Cuff Size: Normal)   Pulse 66   Temp 98.7 F (37.1 C) (Oral)   Resp 18   SpO2 96%   Physical Exam Vitals and nursing note reviewed.  Constitutional:      General: She is not in acute distress.    Appearance: Normal appearance. She is not ill-appearing.  HENT:     Mouth/Throat:     Mouth: Mucous membranes are moist.     Pharynx: Oropharynx is clear.  Cardiovascular:     Rate and Rhythm: Normal rate and regular rhythm.  Pulmonary:     Effort: Pulmonary effort is normal.  Skin:    General: Skin is warm and dry.  Neurological:     General: No focal deficit present.     Mental Status: She is alert and oriented to person, place, and time. Mental status is at baseline.  Psychiatric:        Mood and Affect: Mood normal.        Behavior: Behavior normal.        Thought Content: Thought content normal.        Judgment: Judgment normal.      MD Evaluation Airway: WNL Heart: WNL Abdomen: WNL Chest/ Lungs: WNL ASA  Classification: 3 Mallampati/Airway Score: Two   Imaging: DG Knee 1-2 Views Right Result Date: 02/06/2024 CLINICAL DATA:  BILATERAL knee pain.  No known injury. EXAM: RIGHT KNEE - 1-2 VIEW; LEFT KNEE - 1-2 VIEW COMPARISON:   07/31/2018 FINDINGS: LEFT KNEE: Moderate joint space loss and spurring in the medial compartment. Mild spurring of the patellofemoral compartment. Small knee joint effusion. No acute osseous abnormality. RIGHT KNEE: Moderate joint space loss and spurring of the medial compartment. Mild spurring of the patellofemoral and lateral compartments. Small knee joint effusion. No acute osseous abnormalities. IMPRESSION: Mild-to-moderate degenerative changes of the knees, greatest involving the medial compartments. Electronically Signed   By: Aliene Lloyd M.D.   On: 02/06/2024 12:59   DG Knee 1-2 Views Left Result Date: 02/06/2024 CLINICAL DATA:  BILATERAL knee pain.  No known injury. EXAM: RIGHT KNEE - 1-2 VIEW; LEFT KNEE - 1-2 VIEW COMPARISON:  07/31/2018 FINDINGS: LEFT KNEE: Moderate joint space loss and spurring in the medial compartment. Mild spurring of the patellofemoral compartment. Small knee joint effusion. No acute osseous abnormality. RIGHT KNEE: Moderate joint space loss and spurring of the medial compartment. Mild spurring of the patellofemoral and lateral compartments. Small knee joint effusion. No acute osseous abnormalities. IMPRESSION: Mild-to-moderate degenerative changes of the knees, greatest involving the medial compartments. Electronically Signed   By: Aliene Lloyd M.D.   On: 02/06/2024 12:59   IR Radiologist Eval & Mgmt Result Date: 02/02/2024 EXAM: NEW PATIENT OFFICE VISIT CHIEF COMPLAINT: See Epic note. HISTORY OF PRESENT ILLNESS: See Epic note. REVIEW OF SYSTEMS: See Epic note. PHYSICAL EXAMINATION: See Epic note. ASSESSMENT AND PLAN: See Epic note. Ester Sides, MD [facility] Electronically Signed   By: Ester Sides M.D.   On: 02/02/2024 11:48    Labs:  CBC: No results for input(s): WBC, HGB, HCT, PLT in the last 8760 hours.  COAGS: No results for input(s): INR, APTT in the last 8760 hours.  BMP: Recent Labs     02/24/23 1230 08/22/23 0907  NA  139 140  K 5.1 4.3  CL 100 102  CO2 20 21  GLUCOSE 131* 126*  BUN 14 13  CALCIUM  10.1 9.9  CREATININE 0.78 0.87    LIVER FUNCTION TESTS: Recent Labs    02/24/23 1230 08/22/23 0907  BILITOT 0.7 0.9  AST 23 26  ALT 18 17  ALKPHOS 98 104  PROT 6.8 6.4  ALBUMIN 4.4 4.2    TUMOR MARKERS: No results for input(s): AFPTM, CEA, CA199, CHROMGRNA in the last 8760 hours.  Assessment and Plan: Left knee pain  Lisa [name] is a 68 year old female with history of left knee pain.  She has been seen in consultation with Dr. Jennefer to discuss merits of geniculate artery embolization and has elected to proceed.  She presents for procedure today in her usual state of health.  She has been NPO.  She does not take blood thinners.   The Risks and benefits of embolization were discussed with the patient including, but not limited to bleeding, infection, vascular injury, post operative pain, or contrast induced renal failure.  This procedure involves the use of X-rays and because of the nature of the planned procedure, it is possible that we will have prolonged use of X-ray fluoroscopy.  Potential radiation risks to you include (but are not limited to) the following: - A slightly elevated risk for cancer several years later in life. This risk is typically less than 0.5% percent. This risk is [name] in comparison to the normal incidence of human cancer, which is 33% for women and 50% for men according to the American Cancer Society. - Radiation induced injury can include skin redness, resembling a rash, tissue breakdown / ulcers and hair loss (which can be temporary or permanent).   The likelihood of either of these occurring depends on the difficulty of the procedure and whether you are sensitive to radiation due to previous procedures, disease, or genetic conditions.   IF your procedure requires a prolonged use of radiation, you will be notified and  given written instructions for further action.  It is your responsibility to monitor the irradiated area for the 2 weeks following the procedure and to notify your physician if you are concerned that you have suffered a radiation induced injury.    All of the patient's questions were answered, patient is agreeable to proceed. Consent signed and in chart.   Thank you for this interesting consult.  I greatly enjoyed meeting Lisa [name] and look forward to participating in their care.  A copy of this report was sent to the requesting provider on this date.  Electronically Signed: Sagar Tengan Sue-Ellen Kenzie Flakes, PA 02/13/2024, 8:26 AM   I spent a total of    15 Minutes in face to face in clinical consultation, greater than 50% of which was counseling/coordinating care for left knee pain.

## 2024-02-13 ENCOUNTER — Ambulatory Visit
Admission: RE | Admit: 2024-02-13 | Discharge: 2024-02-13 | Disposition: A | Discharge status: Home / Self Care | Source: Ambulatory Visit | Attending: Interventional Radiology | Admitting: Interventional Radiology

## 2024-02-13 DIAGNOSIS — R1013 Epigastric pain: Secondary | ICD-10-CM | POA: Insufficient documentation

## 2024-02-13 DIAGNOSIS — M1712 Unilateral primary osteoarthritis, left knee: Secondary | ICD-10-CM

## 2024-02-13 DIAGNOSIS — I119 Hypertensive heart disease without heart failure: Secondary | ICD-10-CM | POA: Insufficient documentation

## 2024-02-13 HISTORY — PX: IR EMBO ARTERIAL NOT HEMORR HEMANG INC GUIDE ROADMAPPING: IMG5448

## 2024-02-13 MED ORDER — SODIUM CHLORIDE 0.9 % IV SOLN: INTRAVENOUS | Status: DC

## 2024-02-13 MED ORDER — NITROGLYCERIN 1 MG/10 ML FOR IR/CATH LAB
100.0000 ug | INTRA_ARTERIAL | Status: DC | PRN
  Administered 2024-02-13: 100 ug via INTRA_ARTERIAL

## 2024-02-13 MED ORDER — KETOROLAC TROMETHAMINE 30 MG/ML IJ SOLN
30.0000 mg | Freq: Once | INTRAMUSCULAR | Status: AC
  Administered 2024-02-13: 30 mg via INTRAVENOUS

## 2024-02-13 MED ORDER — ACETAMINOPHEN 10 MG/ML IV SOLN
1000.0000 mg | Freq: Once | INTRAVENOUS | Status: AC
  Administered 2024-02-13: 1000 mg via INTRAVENOUS

## 2024-02-13 MED ORDER — IIOPAMIDOL (ISOVUE-250) INJECTION 51%
40.0000 mL | Freq: Once | INTRAVENOUS | Status: AC | PRN
Start: 2024-02-13 — End: 2024-02-13
  Administered 2024-02-13: 40 mL via INTRA_ARTERIAL

## 2024-02-13 MED ORDER — FENTANYL CITRATE PF 50 MCG/ML IJ SOSY
25.0000 ug | PREFILLED_SYRINGE | INTRAMUSCULAR | Status: DC | PRN
  Administered 2024-02-13 (×2): 25 ug via INTRAVENOUS
  Administered 2024-02-13: 50 ug via INTRAVENOUS

## 2024-02-13 MED ORDER — DEXAMETHASONE SODIUM PHOSPHATE 10 MG/ML IJ SOLN
10.0000 mg | Freq: Once | INTRAMUSCULAR | Status: AC
  Administered 2024-02-13: 10 mg via INTRAVENOUS

## 2024-02-13 MED ORDER — LIDOCAINE-EPINEPHRINE 1 %-1:100000 IJ SOLN
10.0000 mL | Freq: Once | INTRAMUSCULAR | Status: AC
  Administered 2024-02-13: 10 mL via INTRADERMAL

## 2024-02-13 MED ORDER — MIDAZOLAM HCL 2 MG/2ML IJ SOLN
1.0000 mg | INTRAMUSCULAR | Status: DC | PRN
  Administered 2024-02-13 (×3): 1 mg via INTRAVENOUS

## 2024-02-13 NOTE — Procedures (Signed)
 Interventional Radiology Procedure Note  Procedure: Right geniculate artery embolization  Findings: Please refer to procedural dictation for full description. Right proximal SFA 4 Fr access, manual compression.  Complications: None immediate  Estimated Blood Loss: < 5 mL  Recommendations: IR will arrange 1 month outpatient follow up.   Ester Sides, MD

## 2024-02-16 ENCOUNTER — Telehealth: Payer: Self-pay

## 2024-02-16 NOTE — Progress Notes (Signed)
 See telephone note

## 2024-02-17 NOTE — Progress Notes (Unsigned)
    SUBJECTIVE:   CHIEF COMPLAINT / HPI:   Lisa [name] is a 68 y.o. female presenting for her Prolia  injection.  Osteoporosis No recent falls, fractures, broken bones. DEXA done in 03/2020; been on Prolia . No issues with this. Gets it every 6 months.  Other S/p left geniculate artery embolization on 02/13/2024. Doing well with improved pain overall. Plan for right knee at future date.  Semaglutide  increased to 1 mg weekly at 09/2023 OV. Doing well on this, losing weight.  Healthcare maintenance: - Medicare annual wellness visit - rescheduled for 04/12/2024 - Shingrix vaccine - not interested - COVID booster - planning to get at pharmacy  PERTINENT  PMH / PSH: HTN, asthma, hypothyroidism, HLD, prediabetes  OBJECTIVE:   BP 122/65   Pulse 85   Ht 5' 4 (1.626 m)   Wt 241 lb 4 oz (109.4 kg)   SpO2 99%   BMI 41.41 kg/m   General: Patient seated in chair, no acute distress Respiratory: Normal work of breathing on room air Neuro: Alert and appropriately responding to questions.  ASSESSMENT/PLAN:   Assessment & Plan Osteoporosis of vertebra - Prolia  refilled; patient to make nurse visit after she picks up this med from the pharmacy for administration - Lab: BMP to assess kidney function and calcium  level    Alan Flies, MD [facility]

## 2024-02-19 ENCOUNTER — Ambulatory Visit (INDEPENDENT_AMBULATORY_CARE_PROVIDER_SITE_OTHER)

## 2024-02-19 VITALS — BP 122/65 | HR 85 | Ht 64.0 in | Wt 241.2 lb

## 2024-02-19 DIAGNOSIS — M810 Age-related osteoporosis without current pathological fracture: Secondary | ICD-10-CM | POA: Diagnosis not present

## 2024-02-19 MED ORDER — PROLIA 60 MG/ML ~~LOC~~ SOSY: 60.0000 mg | PREFILLED_SYRINGE | SUBCUTANEOUS | 0 refills | Status: DC

## 2024-02-19 NOTE — Assessment & Plan Note (Addendum)
-   Prolia  refilled; patient to make nurse visit after she picks up this med from the pharmacy for administration - Lab: BMP to assess kidney function and calcium  level

## 2024-02-19 NOTE — Patient Instructions (Addendum)
 Lisa [name],   It was great seeing you in clinic today! You came in to get your Prolia  refilled. I have done this for you; I am also getting a recheck of your calcium  level and kidney function with blood work today. Results will be available in MyChart, and I will reach out if anything is abnormal.  Please make a nurse visit to have your Prolia  administered once you pick this up.  Thank you for allowing me to be a part of your care team! Alan Flies, MD [facility] [address] [place], [place], [place] [address] [phone]

## 2024-02-20 ENCOUNTER — Ambulatory Visit: Payer: Self-pay

## 2024-02-20 LAB — BASIC METABOLIC PANEL WITH GFR
BUN/Creatinine Ratio: 21 (ref 12–28)
BUN: 17 mg/dL (ref 8–27)
CO2: 20 mmol/L (ref 20–29)
Calcium: 9.5 mg/dL (ref 8.7–10.3)
Chloride: 103 mmol/L (ref 96–106)
Creatinine, Ser: 0.82 mg/dL (ref 0.57–1.00)
Glucose: 129 mg/dL — ABNORMAL HIGH (ref 70–99)
Potassium: 4.1 mmol/L (ref 3.5–5.2)
Sodium: 138 mmol/L (ref 134–144)
eGFR: 78 mL/min/1.73 (ref >59)

## 2024-02-20 NOTE — Progress Notes (Signed)
 Called patient to relay results of BMP; normal kidney and calcium  level, fine to continue taking Prolia . Pt is waiting on pharmacy to get this medication and is planning to make a nurse visit for administration once she picks up the medication.

## 2024-02-23 ENCOUNTER — Other Ambulatory Visit: Payer: Self-pay | Admitting: Interventional Radiology

## 2024-02-23 DIAGNOSIS — M25561 Pain in right knee: Secondary | ICD-10-CM

## 2024-02-24 ENCOUNTER — Telehealth: Payer: Self-pay

## 2024-02-24 DIAGNOSIS — M810 Age-related osteoporosis without current pathological fracture: Secondary | ICD-10-CM

## 2024-02-24 NOTE — Telephone Encounter (Signed)
 [facility] calls nurse line in regards to Prolia .   She reports a PA is needed for this medication.   Advised will forward to pharmacy team for assistance.

## 2024-02-24 NOTE — Telephone Encounter (Signed)
 Patient returns call to nurse line regarding PA on prolia .   She is asking if prolia  is not approved, if she could try Jubbonti.   Advised patient that we would reach out to Dr. Larraine regarding alternative if prolia  was not approved.   [name], RN

## 2024-02-26 ENCOUNTER — Other Ambulatory Visit (HOSPITAL_COMMUNITY): Payer: Self-pay

## 2024-02-26 MED ORDER — JUBBONTI 60 MG/ML ~~LOC~~ SOSY: 60.0000 mg | PREFILLED_SYRINGE | SUBCUTANEOUS | 0 refills | Status: AC

## 2024-02-26 NOTE — Telephone Encounter (Signed)
 Spoke to patient. Advised of update. She will call to schedule nursing visit once she is able to pick up Jubbonti.   [name], RN

## 2024-02-26 NOTE — Addendum Note (Signed)
 Addended byBETHA LARRAINE PALMA on: 02/26/2024 11:25 AM   Modules accepted: Orders

## 2024-02-26 NOTE — Telephone Encounter (Signed)
 [facility] has a prescription on file for Prolia  for this patient- the patient's insurance now prefers the biosimilar agent Jubbonti. An updated prescription will be required to make this adjustment. No PA is required and patient's copay is $0

## 2024-03-01 ENCOUNTER — Ambulatory Visit

## 2024-03-01 VITALS — BP 130/80 | HR 72 | Temp 98.2°F

## 2024-03-01 DIAGNOSIS — M810 Age-related osteoporosis without current pathological fracture: Secondary | ICD-10-CM

## 2024-03-01 MED ORDER — DENOSUMAB-BBDZ 60 MG/ML ~~LOC~~ SOSY
60.0000 mg | PREFILLED_SYRINGE | Freq: Once | SUBCUTANEOUS | Status: AC
  Administered 2024-03-01: 60 mg via SUBCUTANEOUS

## 2024-03-01 NOTE — Progress Notes (Signed)
 Patient presents to nurse clinic for Jubbonti administration. Patient supplied medication.   Vitals obtained at today's visit. See below.   Today's Vitals   03/01/24 0940  BP: 130/80  Pulse: 72  Temp: 98.2 F (36.8 C)  SpO2: 97%   Administered SQ in right arm. Patient tolerated injection well.   Patient observed several minutes post administration with no adverse reactions.   Provided patient with appt card with follow up reminder for April 2026.  *Spoke with Dr. Larraine regarding if patient would need follow up CMP. As patient has tolerated well in the past, no need for follow up labs. Patient to continue Vitamin D, calcium  supplements.   Instructed patient to call back with any concerns.   [name], RN

## 2024-03-12 NOTE — Progress Notes (Signed)
 Referring Physician(s): Sanford,James B   Chief Complaint: The patient is seen in follow up today s/p left geniculate artery embolization 02/13/24  History of present illness: HPI from initial consultation 02/02/24 Lisa [name] is a 68 y.o. female with a medical history significant for HTN, obesity and severe bilateral knee osteoarthritis. She has received bilateral steroid injections for many years and she also also tried gel injections. The gel injections are very painful and she does not wish to receive these anymore. The patient would like a more permanent option to treat her OA and her PCP discussed knee replacement versus geniculate artery embolization. The patient is leaning towards GAE and she has been referred to Interventional Radiology for further discussion.    Womac Pain Score = 65/96 VAS Pain Score = 8/10   Her pain has been progressively worsening for years.  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  She would like to lose some weight by exercising, but is very limited in her activity due to her pain.  Her pain is worse when climbing stairs and with standing. She has stiffness that is worse in the evening. She does not want to pursue surgery due to several friends of hers that have had knee replacements without any benefit.    Her left knee bothered her more than her right and we discussed treating the left first and assessing response. We discussed the rationale, periprocedural expectations, and long term expected outcomes after geniculate artery embolization. She was in agreement to proceed and underwent a technically successful left geniculate artery embolization 02/13/24. She presents to the clinic today for follow up.   WOMAC today is 17/96.  Her average right knee pain over the past week has decreased to a 3/10.  She has significantly improved stiffness in her right knee.  Now her left is the most bothersome with average pain  around a 6/10.  She would like to proceed with treating the left knee.  Past Medical History:  Diagnosis Date   Alcoholism (HCC)    Chronic -- not interested in cutting back.  Has cut back from 12 beers daily in 2014.     Allergy    Asthma    GERD (gastroesophageal reflux disease)    Hypertension    Hypothyroidism     Past Surgical History:  Procedure Laterality Date   APPENDECTOMY     CESAREAN SECTION     COLONOSCOPY  2010   [facility] - Repeat in 10 years as normal findings    IR EMBO ARTERIAL NOT HEMORR HEMANG INC GUIDE ROADMAPPING  02/13/2024   IR RADIOLOGIST EVAL & MGMT  02/02/2024   TONSILLECTOMY AND ADENOIDECTOMY     TUBAL LIGATION      Allergies: Penicillins and Ace inhibitors  Medications: Prior to Admission medications   Medication Sig Start Date End Date Taking? Authorizing Provider  albuterol  (PROVENTIL ) (2.5 MG/3ML) 0.083% nebulizer solution Take 3 mLs (2.5 mg total) by nebulization every 6 (six) hours as needed for wheezing or shortness of breath. 11/27/20   Brimage, Vondra, DO  aspirin  81 MG EC tablet Take 81 mg by mouth daily.    [provider]  atorvastatin  (LIPITOR) 40 MG tablet Take 1 tablet (40 mg total) by mouth daily. 01/20/24   Larraine Palma, MD  calcium -vitamin D (OSCAL WITH D) 500-5 MG-MCG tablet Take 2 tablets by mouth daily with breakfast. Patient not taking: Reported on 02/19/2024 08/13/22  Marlee Lynwood NOVAK, MD  cetirizine  (ZYRTEC ) 10 MG tablet TAKE 1 TABLET BY MOUTH ONCE DAILY 11/24/17   Elpidio Reyes DEL, MD  denosumab -bbdz (JUBBONTI) 60 MG/ML SOSY Inject 60 mg into the skin every 6 (six) months. 02/26/24   Larraine Palma, MD  esomeprazole  (NEXIUM ) 20 MG capsule Take 1 capsule (20 mg total) by mouth daily at 12 noon. 01/22/24   Larraine Palma, MD  ibuprofen  (ADVIL ) 200 MG tablet Take 400 mg by mouth daily as needed for mild pain (pain score 1-3) or moderate pain (pain score 4-6).    [provider]  levothyroxine  (SYNTHROID ) 25 MCG  tablet TAKE 1 TABLET(25 MCG) BY MOUTH DAILY 10/09/23   Marlee Lynwood NOVAK, MD  losartan  (COZAAR ) 100 MG tablet TAKE 1 TABLET BY MOUTH DAILY 09/22/23   Sanford, James B, MD  metoprolol  succinate (TOPROL -XL) 100 MG 24 hr tablet TAKE 1 TABLET(100 MG) BY MOUTH DAILY WITH OR IMMEDIATELY FOLLOWING A MEAL 01/09/24   Baker, Raguel MATSU, DO  mometasone -formoterol  (DULERA ) 200-5 MCG/ACT AERO Inhale 2 puffs into the lungs daily. 09/22/23   Marlee Lynwood NOVAK, MD  montelukast  (SINGULAIR ) 10 MG tablet TAKE 1 TABLET(10 MG) BY MOUTH DAILY 04/22/23   Marlee Lynwood NOVAK, MD  Semaglutide , 1 MG/DOSE, 4 MG/3ML SOPN Inject 1 mg into the skin once a week. 02/10/24   Larraine Palma, MD     Family History  Problem Relation Age of Onset   COPD Mother    Heart disease Father    COPD Father    Lung cancer Father    COPD Brother     Social History   Socioeconomic History   Marital status: Married    Spouse name: [name]    Number of children: 2   Years of education: 12   Highest education level: Not on file  Occupational History   Not on file  Tobacco Use   Smoking status: Former    Passive exposure: Past   Smokeless tobacco: Former    Quit date: 03/07/1987  Substance and Sexual Activity   Alcohol use: Yes    Alcohol/week: 3.0 standard drinks of alcohol    Types: 3 Cans of beer per week   Drug use: Never   Sexual activity: Yes    Birth control/protection: Post-menopausal, Surgical  Other Topics Concern   Not on file  Social History Narrative   Patient lives with her husband [name].    Patient has 2 adult children, with 6 grandchildren, 1 great grandchild.   Patients family lives all on the same street.    Patient works at [employer] a couple days a week.   Patient enjoys spending time with family and listening to audio books.    Social Drivers of Corporate Investment Banker Strain: Low Risk  (10/07/2022)   Overall Financial Resource Strain (CARDIA)    Difficulty of Paying Living Expenses: Not hard at all   Food Insecurity: No Food Insecurity (10/07/2022)   Hunger Vital Sign    Worried About Running Out of Food in the Last Year: Never true    Ran Out of Food in the Last Year: Never true  Transportation Needs: No Transportation Needs (10/07/2022)   PRAPARE - Administrator, Civil Service (Medical): No    Lack of Transportation (Non-Medical): No  Physical Activity: Sufficiently Active (10/07/2022)   Exercise Vital Sign    Days of Exercise per Week: 5 days    Minutes of Exercise per Session: 30 min  Stress:  No Stress Concern Present (10/07/2022)   [employer] of Occupational Health - Occupational Stress Questionnaire    Feeling of Stress : Only a little  Social Connections: Moderately Integrated (10/07/2022)   Social Connection and Isolation Panel    Frequency of Communication with Friends and Family: More than three times a week    Frequency of Social Gatherings with Friends and Family: Three times a week    Attends Religious Services: 1 to 4 times per year    Active Member of Clubs or Organizations: No    Attends Banker Meetings: Never    Marital Status: Married     Vital Signs: There were no vitals taken for this visit.  Physical Exam  Imaging:  Knee radiographs 02/02/24   Kellgren and Jerilynn Grade III bilaterally     Right GAE 02/13/24      Labs:  CBC: No results for input(s): WBC, HGB, HCT, PLT in the last 8760 hours.  COAGS: No results for input(s): INR, APTT in the last 8760 hours.  BMP: Recent Labs    08/22/23 0907 02/19/24 1507  NA 140 138  K 4.3 4.1  CL 102 103  CO2 21 20  GLUCOSE 126* 129*  BUN 13 17  CALCIUM  9.9 9.5  CREATININE 0.87 0.82    LIVER FUNCTION TESTS: Recent Labs    08/22/23 0907  BILITOT 0.9  AST 26  ALT 17  ALKPHOS 104  PROT 6.4  ALBUMIN 4.2    Assessment and Plan: 68 year old female with a history of advanced bilateral knee osteoarthritis (K&G III) with severe, lifestyle  limiting bilateral knee pain (WOMAC 65/96).  She underwent a technically successful right geniculate artery embolization 02/13/24 which has significantly improved her pain [facility] 65/96-->17/96).  She would like to proceed with left sided geniculate artery embolization.    Plan for left knee geniculate artery embolization at [facility] with moderate sedation via antegrade femoral approach.   Ester Sides, MD Pager: [phone]    I spent a total of 25 Minutes in face to face in clinical consultation, greater than 50% of which was counseling/coordinating care for right knee pain.

## 2024-03-15 ENCOUNTER — Other Ambulatory Visit: Payer: Self-pay

## 2024-03-15 ENCOUNTER — Ambulatory Visit
Admission: RE | Admit: 2024-03-15 | Discharge: 2024-03-15 | Disposition: A | Source: Ambulatory Visit | Attending: Interventional Radiology

## 2024-03-15 ENCOUNTER — Other Ambulatory Visit: Payer: Self-pay | Admitting: Interventional Radiology

## 2024-03-15 DIAGNOSIS — I10 Essential (primary) hypertension: Secondary | ICD-10-CM

## 2024-03-15 DIAGNOSIS — M25561 Pain in right knee: Secondary | ICD-10-CM

## 2024-03-15 DIAGNOSIS — M1712 Unilateral primary osteoarthritis, left knee: Secondary | ICD-10-CM

## 2024-03-15 HISTORY — PX: IR RADIOLOGIST EVAL & MGMT: IMG5224

## 2024-03-15 MED ORDER — LOSARTAN POTASSIUM 100 MG PO TABS: 100.0000 mg | ORAL_TABLET | Freq: Every day | ORAL | 0 refills | Status: AC

## 2024-03-16 ENCOUNTER — Telehealth: Payer: Self-pay

## 2024-03-16 MED ORDER — METHYLPREDNISOLONE 4 MG PO TBPK: ORAL_TABLET | ORAL | 0 refills | Status: AC

## 2024-03-16 NOTE — Discharge Instructions (Signed)

## 2024-03-16 NOTE — Progress Notes (Signed)
 See telephone note

## 2024-03-17 NOTE — Progress Notes (Signed)
 Chief Complaint: Patient was seen in consultation today for left knee pain.   Referring Physician(s): Sonnia Strong J  Patient Status: [facility] - Outpatient   History of Present Illness: Lisa [name] is a 68 y.o. female with a medical history significant for HTN, obesity and severe bilateral knee osteoarthritis. She received bilateral steroid injections for many years and she also tried gel injections. She was interested in a more permanent option to treat her OA and she was referred to Interventional Radiology for geniculate artery embolization. 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 Her right knee was most bothersome and she underwent a technically successful right GAE 02/13/24.  She followed up with me 03/15/24 and was very satisfied with the results from the right GAE. Her right knee pain decreased to a 3/10 and she reported less stiffness. She expressed a desire to have the left knee treated and she presents today for a left geniculate artery embolization.   Past Medical History:  Diagnosis Date   Alcoholism (HCC)    Chronic -- not interested in cutting back.  Has cut back from 12 beers daily in 2014.     Allergy    Asthma    GERD (gastroesophageal reflux disease)    Hypertension    Hypothyroidism     Past Surgical History:  Procedure Laterality Date   APPENDECTOMY     CESAREAN SECTION     COLONOSCOPY  2010   [facility] - Repeat in 10 years as normal findings    IR EMBO ARTERIAL NOT HEMORR HEMANG INC GUIDE ROADMAPPING  02/13/2024   IR RADIOLOGIST EVAL & MGMT  02/02/2024   IR RADIOLOGIST EVAL & MGMT  03/15/2024   TONSILLECTOMY AND ADENOIDECTOMY     TUBAL LIGATION      Allergies: Penicillins and Ace inhibitors  Medications: Prior to Admission medications   Medication Sig Start Date End Date Taking? Authorizing Provider  albuterol   (PROVENTIL ) (2.5 MG/3ML) 0.083% nebulizer solution Take 3 mLs (2.5 mg total) by nebulization every 6 (six) hours as needed for wheezing or shortness of breath. 11/27/20   Brimage, Vondra, DO  aspirin  81 MG EC tablet Take 81 mg by mouth daily.    [provider]  atorvastatin  (LIPITOR) 40 MG tablet Take 1 tablet (40 mg total) by mouth daily. 01/20/24   Larraine Palma, MD  calcium -vitamin D (OSCAL WITH D) 500-5 MG-MCG tablet Take 2 tablets by mouth daily with breakfast. Patient not taking: Reported on 02/19/2024 08/13/22   Marlee Lynwood NOVAK, MD  cetirizine  (ZYRTEC ) 10 MG tablet TAKE 1 TABLET BY MOUTH ONCE DAILY 11/24/17   Elpidio Reyes DEL, MD  denosumab -bbdz (JUBBONTI) 60 MG/ML SOSY Inject 60 mg into the skin every 6 (six) months. 02/26/24   Larraine Palma, MD  esomeprazole  (NEXIUM ) 20 MG capsule Take 1 capsule (20 mg total) by mouth daily at 12 noon. 01/22/24   Larraine Palma, MD  ibuprofen  (ADVIL ) 200 MG tablet Take 400 mg by mouth daily as needed for mild pain (pain score 1-3) or moderate pain (pain score 4-6).    [provider]  levothyroxine  (SYNTHROID ) 25 MCG tablet TAKE 1 TABLET(25 MCG) BY MOUTH DAILY 10/09/23   Marlee Lynwood NOVAK, MD  losartan  (COZAAR ) 100 MG tablet Take 1 tablet (100 mg total) by mouth daily. 03/15/24   Larraine Palma, MD  methylPREDNISolone  (MEDROL  DOSEPAK) 4 MG TBPK  tablet Take as prescribed by pharmacy 03/19/24   Jennefer Ester PARAS, MD  metoprolol  succinate (TOPROL -XL) 100 MG 24 hr tablet TAKE 1 TABLET(100 MG) BY MOUTH DAILY WITH OR IMMEDIATELY FOLLOWING A MEAL 01/09/24   Baker, Raguel MATSU, DO  mometasone -formoterol  (DULERA ) 200-5 MCG/ACT AERO Inhale 2 puffs into the lungs daily. 09/22/23   Marlee Lynwood NOVAK, MD  montelukast  (SINGULAIR ) 10 MG tablet TAKE 1 TABLET(10 MG) BY MOUTH DAILY 04/22/23   Marlee Lynwood NOVAK, MD  Semaglutide , 1 MG/DOSE, 4 MG/3ML SOPN Inject 1 mg into the skin once a week. 02/10/24   Larraine Palma, MD     Family History  Problem Relation Age of Onset    COPD Mother    Heart disease Father    COPD Father    Lung cancer Father    COPD Brother     Social History   Socioeconomic History   Marital status: Married    Spouse name: [name]    Number of children: 2   Years of education: 12   Highest education level: Not on file  Occupational History   Not on file  Tobacco Use   Smoking status: Former    Passive exposure: Past   Smokeless tobacco: Former    Quit date: 03/07/1987  Substance and Sexual Activity   Alcohol use: Yes    Alcohol/week: 3.0 standard drinks of alcohol    Types: 3 Cans of beer per week   Drug use: Never   Sexual activity: Yes    Birth control/protection: Post-menopausal, Surgical  Other Topics Concern   Not on file  Social History Narrative   Patient lives with her husband [name].    Patient has 2 adult children, with 6 grandchildren, 1 great grandchild.   Patients family lives all on the same street.    Patient works at [employer] a couple days a week.   Patient enjoys spending time with family and listening to audio books.    Social Drivers of Corporate Investment Banker Strain: Low Risk  (10/07/2022)   Overall Financial Resource Strain (CARDIA)    Difficulty of Paying Living Expenses: Not hard at all  Food Insecurity: No Food Insecurity (10/07/2022)   Hunger Vital Sign    Worried About Running Out of Food in the Last Year: Never true    Ran Out of Food in the Last Year: Never true  Transportation Needs: No Transportation Needs (10/07/2022)   PRAPARE - Administrator, Civil Service (Medical): No    Lack of Transportation (Non-Medical): No  Physical Activity: Sufficiently Active (10/07/2022)   Exercise Vital Sign    Days of Exercise per Week: 5 days    Minutes of Exercise per Session: 30 min  Stress: No Stress Concern Present (10/07/2022)   [employer] of Occupational Health - Occupational Stress Questionnaire    Feeling of Stress : Only a little  Social Connections: Moderately  Integrated (10/07/2022)   Social Connection and Isolation Panel    Frequency of Communication with Friends and Family: More than three times a week    Frequency of Social Gatherings with Friends and Family: Three times a week    Attends Religious Services: 1 to 4 times per year    Active Member of Clubs or Organizations: No    Attends Banker Meetings: Never    Marital Status: Married    Review of Systems: A 12 point ROS discussed and pertinent positives are indicated in the HPI above.  All other systems are negative.  Review of Systems  Vital Signs: There were no vitals taken for this visit.  Physical Exam  Labs:  CBC: No results for input(s): WBC, HGB, HCT, PLT in the last 8760 hours.  COAGS: No results for input(s): INR, APTT in the last 8760 hours.  BMP: Recent Labs    08/22/23 0907 02/19/24 1507  NA 140 138  K 4.3 4.1  CL 102 103  CO2 21 20  GLUCOSE 126* 129*  BUN 13 17  CALCIUM  9.9 9.5  CREATININE 0.87 0.82    LIVER FUNCTION TESTS: Recent Labs    08/22/23 0907  BILITOT 0.9  AST 26  ALT 17  ALKPHOS 104  PROT 6.4  ALBUMIN 4.2    TUMOR MARKERS: No results for input(s): AFPTM, CEA, CA199, CHROMGRNA in the last 8760 hours.  Assessment and Plan:  Left knee pain: Jay Kempe. Dorner, 68 year old female, presents today for an image-guided left geniculate artery embolization.   Risks and benefits of left geniculate artery embolization were discussed with the patient including, but not limited to bleeding, infection, vascular injury or contrast induced renal failure.  All of the patient's questions were answered, patient is agreeable to proceed. She has been NPO.   Consent signed and in chart.  Thank you for this interesting consult.  I greatly enjoyed meeting Jaanvi Sartin Pereida and look forward to participating in their care.  A copy of this report was sent to the requesting provider on this date.  Electronically  Signed: Warren Dais, AGACNP-BC 03/17/2024, 9:53 AM   I spent a total of  30 Minutes   in face to face in clinical consultation, greater than 50% of which was counseling/coordinating care for left knee pain.

## 2024-03-19 ENCOUNTER — Ambulatory Visit
Admission: RE | Admit: 2024-03-19 | Discharge: 2024-03-19 | Disposition: A | Discharge status: Home / Self Care | Source: Ambulatory Visit | Attending: Interventional Radiology | Admitting: Interventional Radiology

## 2024-03-19 DIAGNOSIS — M1712 Unilateral primary osteoarthritis, left knee: Secondary | ICD-10-CM

## 2024-03-19 HISTORY — PX: IR EMBO ARTERIAL NOT HEMORR HEMANG INC GUIDE ROADMAPPING: IMG5448

## 2024-03-19 MED ORDER — DEXAMETHASONE SOD PHOSPHATE PF 10 MG/ML IJ SOLN
10.0000 mg | Freq: Once | INTRAMUSCULAR | Status: AC
Start: 2024-03-19 — End: 2024-03-19
  Administered 2024-03-19: 10 mg via INTRAVENOUS

## 2024-03-19 MED ORDER — MIDAZOLAM HCL (PF) 2 MG/2ML IJ SOLN
1.0000 mg | INTRAMUSCULAR | Status: DC | PRN
  Administered 2024-03-19 (×2): 1 mg via INTRAVENOUS

## 2024-03-19 MED ORDER — SODIUM CHLORIDE 0.9 % IV SOLN: INTRAVENOUS | Status: DC

## 2024-03-19 MED ORDER — KETOROLAC TROMETHAMINE 30 MG/ML IJ SOLN
30.0000 mg | Freq: Once | INTRAMUSCULAR | Status: AC
  Administered 2024-03-19: 30 mg via INTRAVENOUS

## 2024-03-19 MED ORDER — LIDOCAINE-EPINEPHRINE 1 %-1:100000 IJ SOLN
10.0000 mL | Freq: Once | INTRAMUSCULAR | Status: AC
  Administered 2024-03-19: 10 mL via INTRADERMAL

## 2024-03-19 MED ORDER — FENTANYL CITRATE (PF) 50 MCG/ML IJ SOSY
25.0000 ug | PREFILLED_SYRINGE | INTRAMUSCULAR | Status: DC | PRN
  Administered 2024-03-19 (×2): 50 ug via INTRAVENOUS

## 2024-03-19 MED ORDER — IIOPAMIDOL (ISOVUE-250) INJECTION 51%
100.0000 mL | Freq: Once | INTRAVENOUS | Status: AC | PRN
Start: 2024-03-19 — End: 2024-03-19
  Administered 2024-03-19: 50 mL via INTRA_ARTERIAL

## 2024-03-19 MED ORDER — ACETAMINOPHEN 10 MG/ML IV SOLN
1000.0000 mg | Freq: Once | INTRAVENOUS | Status: AC
Start: 2024-03-19 — End: 2024-03-19
  Administered 2024-03-19: 1000 mg via INTRAVENOUS

## 2024-03-19 MED ORDER — NITROGLYCERIN 1 MG/10 ML FOR IR/CATH LAB
100.0000 ug | INTRA_ARTERIAL | Status: DC | PRN
Start: 2024-03-19 — End: 2024-03-20
  Administered 2024-03-19: 100 ug via INTRA_ARTERIAL

## 2024-03-19 NOTE — Procedures (Signed)
 Interventional Radiology Procedure Note  Procedure: Left geniculate artery embolization   Findings: Please refer to procedural dictation for full description. Left proximal SFA 4 Fr access, manual compression.  Complications: None immediate  Estimated Blood Loss: < 5 mL  Recommendations: IR will arrange 1 month outpatient follow up.   Ester Sides, MD

## 2024-03-22 ENCOUNTER — Telehealth: Payer: Self-pay

## 2024-03-22 NOTE — Progress Notes (Signed)
 See telephone note

## 2024-04-05 ENCOUNTER — Other Ambulatory Visit: Payer: Self-pay | Admitting: Interventional Radiology

## 2024-04-05 ENCOUNTER — Other Ambulatory Visit: Payer: Self-pay

## 2024-04-05 DIAGNOSIS — E119 Type 2 diabetes mellitus without complications: Secondary | ICD-10-CM

## 2024-04-05 DIAGNOSIS — M1712 Unilateral primary osteoarthritis, left knee: Secondary | ICD-10-CM

## 2024-04-06 ENCOUNTER — Other Ambulatory Visit: Payer: Self-pay

## 2024-04-06 DIAGNOSIS — J4540 Moderate persistent asthma, uncomplicated: Secondary | ICD-10-CM

## 2024-04-06 DIAGNOSIS — J4520 Mild intermittent asthma, uncomplicated: Secondary | ICD-10-CM

## 2024-04-06 DIAGNOSIS — I10 Essential (primary) hypertension: Secondary | ICD-10-CM

## 2024-04-07 MED ORDER — MONTELUKAST SODIUM 10 MG PO TABS: ORAL_TABLET | ORAL | 3 refills | Status: AC

## 2024-04-12 ENCOUNTER — Encounter

## 2024-04-21 ENCOUNTER — Other Ambulatory Visit

## 2024-04-23 NOTE — Progress Notes (Signed)
 Referring Physician(s): Marlee Lynwood NOVAK   Chief Complaint: The patient is seen in follow up today s/p right GAE 02/13/24 and left GAE 03/19/24  History of present illness:  Lisa [name], 68 year old female, has a medical history significant for HTN, obesity and severe bilateral knee arthritis with chronic knee pain unresponsive to conservative measures. She was referred to Interventional Radiology and first met with me 02/02/24. She described the extent of her worsening knee pain and the detrimental effect on her ability to care for herself.  We discussed the rationale, periprocedural expectations, and long term expected outcomes after geniculate artery embolization. She was agreeable to proceed and we treated the right knee 02/13/24. She tolerated the procedure well and experienced a satisfactory decrease in her pain level. She was eager to proceed with treating the left knee and this was performed 03/19/24. She again tolerated the procedure well and she presents to the clinic today for follow up.   Her left knee has responded very well with near complete elimination of the pain.  Her right knee is starting to hurt once again, she rates this now an 8/10.  She does describe resolution of the previous stiffness she had in each knee, including on the right despite the recurrence of pain.    Past Medical History:  Diagnosis Date   Alcoholism (HCC)    Chronic -- not interested in cutting back.  Has cut back from 12 beers daily in 2014.     Allergy    Asthma    GERD (gastroesophageal reflux disease)    Hypertension    Hypothyroidism     Past Surgical History:  Procedure Laterality Date   APPENDECTOMY     CESAREAN SECTION     COLONOSCOPY  2010   [facility] - Repeat in 10 years as normal findings    IR EMBO ARTERIAL NOT HEMORR HEMANG INC GUIDE ROADMAPPING  02/13/2024   IR EMBO ARTERIAL NOT HEMORR HEMANG INC GUIDE ROADMAPPING  03/19/2024   IR RADIOLOGIST EVAL & MGMT  02/02/2024   IR  RADIOLOGIST EVAL & MGMT  03/15/2024   TONSILLECTOMY AND ADENOIDECTOMY     TUBAL LIGATION      Allergies: Penicillins and Ace inhibitors  Medications: Prior to Admission medications   Medication Sig Start Date End Date Taking? Authorizing Provider  albuterol  (PROVENTIL ) (2.5 MG/3ML) 0.083% nebulizer solution Take 3 mLs (2.5 mg total) by nebulization every 6 (six) hours as needed for wheezing or shortness of breath. 11/27/20   Brimage, Vondra, DO  aspirin  81 MG EC tablet Take 81 mg by mouth daily.    [provider]  atorvastatin  (LIPITOR) 40 MG tablet Take 1 tablet (40 mg total) by mouth daily. 01/20/24   Larraine Palma, MD  calcium -vitamin D (OSCAL WITH D) 500-5 MG-MCG tablet Take 2 tablets by mouth daily with breakfast. Patient not taking: Reported on 02/19/2024 08/13/22   Marlee Lynwood NOVAK, MD  cetirizine  (ZYRTEC ) 10 MG tablet TAKE 1 TABLET BY MOUTH ONCE DAILY 11/24/17   Elpidio Reyes DEL, MD  denosumab -bbdz (JUBBONTI ) 60 MG/ML SOSY Inject 60 mg into the skin every 6 (six) months. 02/26/24   Larraine Palma, MD  esomeprazole  (NEXIUM ) 20 MG capsule Take 1 capsule (20 mg total) by mouth daily at 12 noon. 01/22/24   Larraine Palma, MD  ibuprofen  (ADVIL ) 200 MG tablet Take 400 mg by mouth daily as needed for mild pain (pain score 1-3) or moderate pain (pain score 4-6).    [provider]  levothyroxine  (SYNTHROID ) 25 MCG tablet TAKE 1 TABLET(25 MCG) BY MOUTH DAILY 10/09/23   Marlee Lynwood NOVAK, MD  losartan  (COZAAR ) 100 MG tablet Take 1 tablet (100 mg total) by mouth daily. 03/15/24   Larraine Palma, MD  methylPREDNISolone  (MEDROL  DOSEPAK) 4 MG TBPK tablet Take as prescribed by pharmacy 03/19/24   Jennefer Ester PARAS, MD  metoprolol  succinate (TOPROL -XL) 100 MG 24 hr tablet TAKE 1 TABLET(100 MG) BY MOUTH DAILY WITH OR IMMEDIATELY FOLLOWING A MEAL 01/09/24   Baker, Raguel MATSU, DO  mometasone -formoterol  (DULERA ) 200-5 MCG/ACT AERO Inhale 2 puffs into the lungs daily. 09/22/23   Marlee Lynwood NOVAK, MD   montelukast  (SINGULAIR ) 10 MG tablet TAKE 1 TABLET(10 MG) BY MOUTH DAILY 04/07/24   Larraine Palma, MD  OZEMPIC , 1 MG/DOSE, 4 MG/3ML SOPN INJECT 1 MG INTO THE SKIN ONCE A WEEK 04/05/24   Larraine Palma, MD     Family History  Problem Relation Age of Onset   COPD Mother    Heart disease Father    COPD Father    Lung cancer Father    COPD Brother     Social History   Socioeconomic History   Marital status: Married    Spouse name: [name]    Number of children: 2   Years of education: 12   Highest education level: Not on file  Occupational History   Not on file  Tobacco Use   Smoking status: Former    Passive exposure: Past   Smokeless tobacco: Former    Quit date: 03/07/1987  Substance and Sexual Activity   Alcohol use: Yes    Alcohol/week: 3.0 standard drinks of alcohol    Types: 3 Cans of beer per week   Drug use: Never   Sexual activity: Yes    Birth control/protection: Post-menopausal, Surgical  Other Topics Concern   Not on file  Social History Narrative   Patient lives with her husband [name].    Patient has 2 adult children, with 6 grandchildren, 1 great grandchild.   Patients family lives all on the same street.    Patient works at [employer] a couple days a week.   Patient enjoys spending time with family and listening to audio books.    Social Drivers of Corporate Investment Banker Strain: Low Risk  (10/07/2022)   Overall Financial Resource Strain (CARDIA)    Difficulty of Paying Living Expenses: Not hard at all  Food Insecurity: No Food Insecurity (10/07/2022)   Hunger Vital Sign    Worried About Running Out of Food in the Last Year: Never true    Ran Out of Food in the Last Year: Never true  Transportation Needs: No Transportation Needs (10/07/2022)   PRAPARE - Administrator, Civil Service (Medical): No    Lack of Transportation (Non-Medical): No  Physical Activity: Sufficiently Active (10/07/2022)   Exercise Vital Sign    Days of  Exercise per Week: 5 days    Minutes of Exercise per Session: 30 min  Stress: No Stress Concern Present (10/07/2022)   [employer] of Occupational Health - Occupational Stress Questionnaire    Feeling of Stress : Only a little  Social Connections: Moderately Integrated (10/07/2022)   Social Connection and Isolation Panel    Frequency of Communication with Friends and Family: More than three times a week    Frequency of Social Gatherings with Friends and Family: Three times a week    Attends Religious Services: 1 to 4 times per year  Active Member of Clubs or Organizations: No    Attends Banker Meetings: Never    Marital Status: Married     Vital Signs: There were no vitals taken for this visit.  Physical Exam Constitutional:      General: She is not in acute distress. HENT:     Head: Normocephalic.     Mouth/Throat:     Mouth: Mucous membranes are moist.  Eyes:     General: No scleral icterus. Cardiovascular:     Rate and Rhythm: Normal rate and regular rhythm.  Pulmonary:     Effort: No respiratory distress.  Abdominal:     General: There is no distension.  Musculoskeletal:     Right lower leg: No edema.     Left lower leg: No edema.  Skin:    General: Skin is warm and dry.  Neurological:     Mental Status: She is alert and oriented to person, place, and time.     Imaging:  Knee radiographs 02/02/24   Kellgren and Jerilynn Grade III bilaterally       Right GAE 02/13/24          Left GAE 03/19/24      Labs:  CBC: No results for input(s): WBC, HGB, HCT, PLT in the last 8760 hours.  COAGS: No results for input(s): INR, APTT in the last 8760 hours.  BMP: Recent Labs    08/22/23 0907 02/19/24 1507  NA 140 138  K 4.3 4.1  CL 102 103  CO2 21 20  GLUCOSE 126* 129*  BUN 13 17  CALCIUM  9.9 9.5  CREATININE 0.87 0.82    LIVER FUNCTION TESTS: Recent Labs    08/22/23 0907  BILITOT 0.9  AST 26  ALT 17   ALKPHOS 104  PROT 6.4  ALBUMIN 4.2    Assessment and Plan: 68 year old female with a history of advanced bilateral knee osteoarthritis now status post geniculate artery embolizations to the right knee (02/13/24) and left knee (03/19/24).  She initially responded very well in regard to the right knee, but that pain is returning.  Her left knee remains pain free after treatment.  She is curious about re-treatment of the right knee.  I'd like to wait a full 6 months before consideration of this.  Plan to follow up in 2-3 months to assess bilateral knee pain and consider repeat GAE at that time, if needed.   Ester Sides, MD Pager: [phone]    I spent a total of 25 Minutes in face to face in clinical consultation, greater than 50% of which was counseling/coordinating care for bilateral knee pain

## 2024-04-26 ENCOUNTER — Inpatient Hospital Stay
Admission: RE | Admit: 2024-04-26 | Discharge: 2024-04-26 | Disposition: A | Source: Ambulatory Visit | Attending: Interventional Radiology

## 2024-04-26 DIAGNOSIS — M1712 Unilateral primary osteoarthritis, left knee: Secondary | ICD-10-CM

## 2024-04-26 HISTORY — PX: IR RADIOLOGIST EVAL & MGMT: IMG5224

## 2024-05-04 ENCOUNTER — Other Ambulatory Visit: Payer: Self-pay

## 2024-05-04 DIAGNOSIS — J4541 Moderate persistent asthma with (acute) exacerbation: Secondary | ICD-10-CM

## 2024-05-04 MED ORDER — ALBUTEROL SULFATE (2.5 MG/3ML) 0.083% IN NEBU: 2.5000 mg | INHALATION_SOLUTION | Freq: Four times a day (QID) | RESPIRATORY_TRACT | 12 refills | Status: AC | PRN

## 2024-05-04 NOTE — Telephone Encounter (Signed)
 Patient calls nurse line requesting refill on albuterol  solution.   She states that she has not been feeling well for the last few days and has been having mild wheezing at night.   Reports cough, sore throat.   Denies fever, body aches or chills. She has been taking coricidin cough medication, feels that she is improving.   Return precautions discussed.   Will forward refill request to provider.   [name], RN

## 2024-05-04 NOTE — Telephone Encounter (Signed)
 Called patient and advised of update.   Return/ED precautions discussed.   [name], RN

## 2024-05-21 ENCOUNTER — Other Ambulatory Visit (HOSPITAL_COMMUNITY): Payer: Self-pay

## 2024-05-27 ENCOUNTER — Other Ambulatory Visit: Payer: Self-pay

## 2024-05-27 DIAGNOSIS — J4541 Moderate persistent asthma with (acute) exacerbation: Secondary | ICD-10-CM

## 2024-05-28 ENCOUNTER — Other Ambulatory Visit: Payer: Self-pay

## 2024-05-28 DIAGNOSIS — J4520 Mild intermittent asthma, uncomplicated: Secondary | ICD-10-CM

## 2024-05-28 MED ORDER — MOMETASONE FURO-FORMOTEROL FUM 200-5 MCG/ACT IN AERO: 2.0000 | INHALATION_SPRAY | Freq: Every day | RESPIRATORY_TRACT | 3 refills | Status: AC

## 2024-05-28 NOTE — Telephone Encounter (Signed)
 Lisa [name]

## 2024-05-31 ENCOUNTER — Other Ambulatory Visit: Payer: Self-pay

## 2024-05-31 DIAGNOSIS — E119 Type 2 diabetes mellitus without complications: Secondary | ICD-10-CM

## 2024-07-12 ENCOUNTER — Encounter
